# Patient Record
Sex: Male | Born: 1960 | Race: White | Hispanic: No | Marital: Married | Smoking: Current every day smoker
Health system: Southern US, Community
[De-identification: ages and names within clinical notes are randomized; demographics above are authoritative.]

## PROBLEM LIST (undated history)

## (undated) DIAGNOSIS — I10 Essential (primary) hypertension: Secondary | ICD-10-CM

## (undated) DIAGNOSIS — C801 Malignant (primary) neoplasm, unspecified: Secondary | ICD-10-CM

## (undated) DIAGNOSIS — K259 Gastric ulcer, unspecified as acute or chronic, without hemorrhage or perforation: Secondary | ICD-10-CM

## (undated) DIAGNOSIS — B029 Zoster without complications: Secondary | ICD-10-CM

## (undated) DIAGNOSIS — D649 Anemia, unspecified: Secondary | ICD-10-CM

## (undated) DIAGNOSIS — R197 Diarrhea, unspecified: Secondary | ICD-10-CM

## (undated) DIAGNOSIS — I81 Portal vein thrombosis: Secondary | ICD-10-CM

## (undated) DIAGNOSIS — R634 Abnormal weight loss: Secondary | ICD-10-CM

## (undated) DIAGNOSIS — K5900 Constipation, unspecified: Secondary | ICD-10-CM

## (undated) DIAGNOSIS — K635 Polyp of colon: Secondary | ICD-10-CM

## (undated) DIAGNOSIS — K7460 Unspecified cirrhosis of liver: Secondary | ICD-10-CM

## (undated) DIAGNOSIS — K649 Unspecified hemorrhoids: Secondary | ICD-10-CM

## (undated) HISTORY — DX: Unspecified hemorrhoids: K64.9

## (undated) HISTORY — DX: Unspecified cirrhosis of liver: K74.60

## (undated) HISTORY — DX: Abnormal weight loss: R63.4

## (undated) HISTORY — DX: Malignant (primary) neoplasm, unspecified: C80.1

## (undated) HISTORY — DX: Diarrhea, unspecified: R19.7

## (undated) HISTORY — DX: Polyp of colon: K63.5

## (undated) HISTORY — DX: Gastric ulcer, unspecified as acute or chronic, without hemorrhage or perforation: K25.9

## (undated) HISTORY — DX: Constipation, unspecified: K59.00

---

## 2012-02-14 ENCOUNTER — Emergency Department: Payer: Self-pay | Admitting: Unknown Physician Specialty

## 2012-02-14 LAB — CBC
HGB: 18.8 g/dL — ABNORMAL HIGH (ref 13.0–18.0)
MCH: 32.4 pg (ref 26.0–34.0)
Platelet: 327 10*3/uL (ref 150–440)
RBC: 5.79 10*6/uL (ref 4.40–5.90)
RDW: 13.9 % (ref 11.5–14.5)
WBC: 21.4 10*3/uL — ABNORMAL HIGH (ref 3.8–10.6)

## 2012-02-14 LAB — COMPREHENSIVE METABOLIC PANEL
Albumin: 3.7 g/dL (ref 3.4–5.0)
Anion Gap: 10 (ref 7–16)
Bilirubin,Total: 0.3 mg/dL (ref 0.2–1.0)
Chloride: 105 mmol/L (ref 98–107)
Co2: 25 mmol/L (ref 21–32)
Creatinine: 1.28 mg/dL (ref 0.60–1.30)
EGFR (African American): >60
EGFR (Non-African Amer.): >60
Glucose: 182 mg/dL — ABNORMAL HIGH (ref 65–99)
Osmolality: 288 (ref 275–301)
Potassium: 4.3 mmol/L (ref 3.5–5.1)
SGPT (ALT): 23 U/L
Sodium: 140 mmol/L (ref 136–145)

## 2012-02-14 LAB — MAGNESIUM: Magnesium: 2.1 mg/dL

## 2012-02-14 LAB — TROPONIN I: Troponin-I: <0.02 ng/mL

## 2015-05-06 ENCOUNTER — Encounter: Payer: Self-pay | Admitting: Emergency Medicine

## 2015-05-06 ENCOUNTER — Inpatient Hospital Stay
Admission: EM | Admit: 2015-05-06 | Discharge: 2015-05-14 | DRG: 811 | Disposition: A | Discharge status: Home / Self Care | Payer: Self-pay | Attending: Internal Medicine | Admitting: Internal Medicine

## 2015-05-06 DIAGNOSIS — Z87891 Personal history of nicotine dependence: Secondary | ICD-10-CM

## 2015-05-06 DIAGNOSIS — R4781 Slurred speech: Secondary | ICD-10-CM

## 2015-05-06 DIAGNOSIS — R7401 Elevation of levels of liver transaminase levels: Secondary | ICD-10-CM

## 2015-05-06 DIAGNOSIS — K222 Esophageal obstruction: Secondary | ICD-10-CM | POA: Diagnosis present

## 2015-05-06 DIAGNOSIS — Z9114 Patient's other noncompliance with medication regimen: Secondary | ICD-10-CM

## 2015-05-06 DIAGNOSIS — K3189 Other diseases of stomach and duodenum: Secondary | ICD-10-CM | POA: Diagnosis present

## 2015-05-06 DIAGNOSIS — Z7982 Long term (current) use of aspirin: Secondary | ICD-10-CM

## 2015-05-06 DIAGNOSIS — E43 Unspecified severe protein-calorie malnutrition: Secondary | ICD-10-CM | POA: Diagnosis present

## 2015-05-06 DIAGNOSIS — K7030 Alcoholic cirrhosis of liver without ascites: Secondary | ICD-10-CM | POA: Diagnosis present

## 2015-05-06 DIAGNOSIS — R410 Disorientation, unspecified: Secondary | ICD-10-CM | POA: Insufficient documentation

## 2015-05-06 DIAGNOSIS — J9601 Acute respiratory failure with hypoxia: Secondary | ICD-10-CM | POA: Diagnosis present

## 2015-05-06 DIAGNOSIS — Z4659 Encounter for fitting and adjustment of other gastrointestinal appliance and device: Secondary | ICD-10-CM

## 2015-05-06 DIAGNOSIS — J189 Pneumonia, unspecified organism: Secondary | ICD-10-CM | POA: Insufficient documentation

## 2015-05-06 DIAGNOSIS — K922 Gastrointestinal hemorrhage, unspecified: Secondary | ICD-10-CM | POA: Insufficient documentation

## 2015-05-06 DIAGNOSIS — D500 Iron deficiency anemia secondary to blood loss (chronic): Secondary | ICD-10-CM | POA: Diagnosis present

## 2015-05-06 DIAGNOSIS — E876 Hypokalemia: Secondary | ICD-10-CM | POA: Diagnosis present

## 2015-05-06 DIAGNOSIS — Z6820 Body mass index (BMI) 20.0-20.9, adult: Secondary | ICD-10-CM

## 2015-05-06 DIAGNOSIS — J690 Pneumonitis due to inhalation of food and vomit: Secondary | ICD-10-CM | POA: Insufficient documentation

## 2015-05-06 DIAGNOSIS — R140 Abdominal distension (gaseous): Secondary | ICD-10-CM | POA: Insufficient documentation

## 2015-05-06 DIAGNOSIS — I10 Essential (primary) hypertension: Secondary | ICD-10-CM | POA: Diagnosis present

## 2015-05-06 DIAGNOSIS — I81 Portal vein thrombosis: Secondary | ICD-10-CM | POA: Diagnosis present

## 2015-05-06 DIAGNOSIS — D649 Anemia, unspecified: Secondary | ICD-10-CM

## 2015-05-06 DIAGNOSIS — E440 Moderate protein-calorie malnutrition: Secondary | ICD-10-CM | POA: Insufficient documentation

## 2015-05-06 DIAGNOSIS — K766 Portal hypertension: Secondary | ICD-10-CM | POA: Diagnosis present

## 2015-05-06 DIAGNOSIS — D62 Acute posthemorrhagic anemia: Principal | ICD-10-CM | POA: Diagnosis present

## 2015-05-06 DIAGNOSIS — J9690 Respiratory failure, unspecified, unspecified whether with hypoxia or hypercapnia: Secondary | ICD-10-CM

## 2015-05-06 DIAGNOSIS — R740 Nonspecific elevation of levels of transaminase and lactic acid dehydrogenase [LDH]: Secondary | ICD-10-CM

## 2015-05-06 DIAGNOSIS — R195 Other fecal abnormalities: Secondary | ICD-10-CM

## 2015-05-06 DIAGNOSIS — Z8249 Family history of ischemic heart disease and other diseases of the circulatory system: Secondary | ICD-10-CM

## 2015-05-06 DIAGNOSIS — R7989 Other specified abnormal findings of blood chemistry: Secondary | ICD-10-CM | POA: Diagnosis present

## 2015-05-06 DIAGNOSIS — R748 Abnormal levels of other serum enzymes: Secondary | ICD-10-CM | POA: Diagnosis present

## 2015-05-06 DIAGNOSIS — F10231 Alcohol dependence with withdrawal delirium: Secondary | ICD-10-CM | POA: Diagnosis present

## 2015-05-06 DIAGNOSIS — F28 Other psychotic disorder not due to a substance or known physiological condition: Secondary | ICD-10-CM | POA: Diagnosis present

## 2015-05-06 DIAGNOSIS — F419 Anxiety disorder, unspecified: Secondary | ICD-10-CM | POA: Diagnosis present

## 2015-05-06 DIAGNOSIS — R0602 Shortness of breath: Secondary | ICD-10-CM | POA: Insufficient documentation

## 2015-05-06 HISTORY — DX: Essential (primary) hypertension: I10

## 2015-05-06 LAB — CBC WITH DIFFERENTIAL/PLATELET
BASOS ABS: 0 10*3/uL (ref 0–0.1)
BASOS PCT: 0 %
EOS ABS: 0 10*3/uL (ref 0–0.7)
EOS PCT: 0 %
HCT: 18.5 % — ABNORMAL LOW (ref 40.0–52.0)
Hemoglobin: 5.9 g/dL — ABNORMAL LOW (ref 13.0–18.0)
Lymphocytes Relative: 5 %
Lymphs Abs: 0.6 10*3/uL — ABNORMAL LOW (ref 1.0–3.6)
MCH: 24.7 pg — ABNORMAL LOW (ref 26.0–34.0)
MCHC: 31.6 g/dL — AB (ref 32.0–36.0)
MCV: 78.1 fL — ABNORMAL LOW (ref 80.0–100.0)
MONO ABS: 1 10*3/uL (ref 0.2–1.0)
Monocytes Relative: 8 %
Neutro Abs: 10.9 10*3/uL — ABNORMAL HIGH (ref 1.4–6.5)
Neutrophils Relative %: 87 %
PLATELETS: 142 10*3/uL — AB (ref 150–440)
RBC: 2.37 MIL/uL — ABNORMAL LOW (ref 4.40–5.90)
RDW: 18.6 % — AB (ref 11.5–14.5)
WBC: 12.5 10*3/uL — ABNORMAL HIGH (ref 3.8–10.6)

## 2015-05-06 LAB — URINALYSIS COMPLETE WITH MICROSCOPIC (ARMC ONLY)
BILIRUBIN URINE: NEGATIVE
Bacteria, UA: NONE SEEN
GLUCOSE, UA: NEGATIVE mg/dL
Hgb urine dipstick: NEGATIVE
Ketones, ur: NEGATIVE mg/dL
Leukocytes, UA: NEGATIVE
Nitrite: NEGATIVE
PH: 6 (ref 5.0–8.0)
Protein, ur: 100 mg/dL — AB
Specific Gravity, Urine: 1.014 (ref 1.005–1.030)

## 2015-05-06 LAB — COMPREHENSIVE METABOLIC PANEL
ALBUMIN: 1.8 g/dL — AB (ref 3.5–5.0)
ALK PHOS: 150 U/L — AB (ref 38–126)
ALT: 28 U/L (ref 17–63)
ANION GAP: 7 (ref 5–15)
AST: 27 U/L (ref 15–41)
BILIRUBIN TOTAL: 1.3 mg/dL — AB (ref 0.3–1.2)
BUN: 13 mg/dL (ref 6–20)
CALCIUM: 7.5 mg/dL — AB (ref 8.9–10.3)
CO2: 25 mmol/L (ref 22–32)
CREATININE: 0.67 mg/dL (ref 0.61–1.24)
Chloride: 104 mmol/L (ref 101–111)
GFR calc Af Amer: >60 mL/min (ref >60)
GFR calc non Af Amer: >60 mL/min (ref >60)
GLUCOSE: 162 mg/dL — AB (ref 65–99)
Potassium: 2.4 mmol/L — CL (ref 3.5–5.1)
Sodium: 136 mmol/L (ref 135–145)
TOTAL PROTEIN: 5.5 g/dL — AB (ref 6.5–8.1)

## 2015-05-06 LAB — AMMONIA: AMMONIA: 35 umol/L (ref 9–35)

## 2015-05-06 LAB — PROTIME-INR
INR: 1.37
Prothrombin Time: 17.1 seconds — ABNORMAL HIGH (ref 11.4–15.0)

## 2015-05-06 LAB — PREPARE RBC (CROSSMATCH)

## 2015-05-06 LAB — PHOSPHORUS: PHOSPHORUS: 2.4 mg/dL — AB (ref 2.5–4.6)

## 2015-05-06 LAB — MAGNESIUM: Magnesium: 1.8 mg/dL (ref 1.7–2.4)

## 2015-05-06 LAB — ABO/RH: ABO/RH(D): A POS

## 2015-05-06 MED ORDER — SODIUM CHLORIDE 0.9 % IV SOLN
8.0000 mg/h | INTRAVENOUS | Status: AC
  Administered 2015-05-07 – 2015-05-09 (×4): 8 mg/h via INTRAVENOUS
  Filled 2015-05-06 (×6): qty 80

## 2015-05-06 MED ORDER — ACETAMINOPHEN 325 MG PO TABS
650.0000 mg | ORAL_TABLET | Freq: Four times a day (QID) | ORAL | Status: DC | PRN
  Administered 2015-05-10: 650 mg via ORAL
  Filled 2015-05-06: qty 2

## 2015-05-06 MED ORDER — OXYCODONE HCL 5 MG PO TABS: 5.0000 mg | ORAL_TABLET | ORAL | Status: DC | PRN

## 2015-05-06 MED ORDER — MORPHINE SULFATE (PF) 2 MG/ML IV SOLN: 2.0000 mg | INTRAVENOUS | Status: DC | PRN

## 2015-05-06 MED ORDER — SODIUM CHLORIDE 0.9 % IV SOLN
10.0000 mL/h | Freq: Once | INTRAVENOUS | Status: AC
  Administered 2015-05-07: 10 mL/h via INTRAVENOUS

## 2015-05-06 MED ORDER — POTASSIUM CHLORIDE CRYS ER 20 MEQ PO TBCR: 40.0000 meq | EXTENDED_RELEASE_TABLET | Freq: Once | ORAL | Status: AC

## 2015-05-06 MED ORDER — ACETAMINOPHEN 650 MG RE SUPP
650.0000 mg | Freq: Four times a day (QID) | RECTAL | Status: DC | PRN
  Administered 2015-05-08 – 2015-05-10 (×3): 650 mg via RECTAL
  Filled 2015-05-06 (×3): qty 1

## 2015-05-06 MED ORDER — POTASSIUM CHLORIDE CRYS ER 20 MEQ PO TBCR
40.0000 meq | EXTENDED_RELEASE_TABLET | Freq: Once | ORAL | Status: AC
  Administered 2015-05-06: 40 meq via ORAL

## 2015-05-06 MED ORDER — POTASSIUM CHLORIDE CRYS ER 20 MEQ PO TBCR
EXTENDED_RELEASE_TABLET | ORAL | Status: AC
  Administered 2015-05-06: 40 meq via ORAL
  Filled 2015-05-06: qty 2

## 2015-05-06 MED ORDER — SODIUM CHLORIDE 0.9 % IV SOLN
80.0000 mg | Freq: Once | INTRAVENOUS | Status: AC
  Administered 2015-05-06: 80 mg via INTRAVENOUS
  Filled 2015-05-06: qty 80

## 2015-05-06 MED ORDER — SODIUM CHLORIDE 0.9 % IV SOLN
INTRAVENOUS | Status: DC
  Administered 2015-05-06 – 2015-05-08 (×3): via INTRAVENOUS

## 2015-05-06 MED ORDER — POTASSIUM CHLORIDE 10 MEQ/100ML IV SOLN
10.0000 meq | Freq: Once | INTRAVENOUS | Status: AC
  Administered 2015-05-06: 10 meq via INTRAVENOUS
  Filled 2015-05-06: qty 100

## 2015-05-06 NOTE — ED Notes (Signed)
Pt presents to ED via EMS with c/o lethargic with no appetite for about 3 weeks. Pt states has been sleeping a lot and family was concern and call EMS. Pt also reports he was heavy drinking and stopped drinking for about 3.5 weeks now. States he gets shaky at night. Pt alerts and oriented x4 at this time. Per EMS, CBG-218, BP-98/62, HR-105.

## 2015-05-06 NOTE — ED Notes (Signed)
Patient with abdominal pain, chest pain, diarrhea x 3 days. Unsure if having any fever at home. Was at work and vomited so he decided to come get checked out.

## 2015-05-06 NOTE — H&P (Addendum)
[facility] [place] NAME: Manuel Jimenez    MR#:  [id number]  DATE OF BIRTH:  May 20, 1961   DATE OF ADMISSION:  05/06/2015  PRIMARY CARE PHYSICIAN: Marinda Elk, MD   REQUESTING/REFERRING PHYSICIAN: Lord  CHIEF COMPLAINT:   Chief Complaint  Patient presents with  . Fatigue    HISTORY OF PRESENT ILLNESS:  Manuel Jimenez  is a 54 y.o. male with a known history of essential hypertension, alcohol abuse presenting with generalized fatigue/weakness. He describes approximately 3-4 week total duration of progressive fatigue and generalized weakness. He also complains of associated epigastric pain, "pain" when asked to further qualify, nonradiating, intensity 4/10 no worsening or relieving factors. Also describes having dyspnea on exertion with his fatigue. He denotes having loose bowel movements for approximately 1-2 weeks however denies any frank bleeding or any melenic colored stools. Given progressive symptoms decided presents to Hospital further workup and evaluation found to be anemic with hemoglobin of 5.9. Emergency department course: Transfusion 2 unit packed red blood cells initiated, Protonix drip initiated  PAST MEDICAL HISTORY:   Past Medical History  Diagnosis Date  . Hypertension     PAST SURGICAL HISTORY:  History reviewed. No pertinent past surgical history.  SOCIAL HISTORY:   Social History  Substance Use Topics  . Smoking status: Former Research scientist (life sciences)  . Smokeless tobacco: Never Used  . Alcohol Use: Yes     Comment: occ    FAMILY HISTORY:   Family History  Problem Relation Age of Onset  . CAD Father     DRUG ALLERGIES:  No Known Allergies  REVIEW OF SYSTEMS:  REVIEW OF SYSTEMS:  CONSTITUTIONAL: Denies fevers, chills, positive fatigue, weakness.  EYES: Denies blurred vision, double vision, or eye pain.  EARS, NOSE, THROAT: Denies tinnitus, ear pain, hearing loss.  RESPIRATORY: denies cough,  shortness of breath, wheezing  CARDIOVASCULAR: Denies chest pain, palpitations, edema.  GASTROINTESTINAL: Denies nausea, vomiting, positive diarrhea, abdominal pain.  GENITOURINARY: Denies dysuria, hematuria.  ENDOCRINE: Denies nocturia or thyroid problems. HEMATOLOGIC AND LYMPHATIC: Denies easy bruising or bleeding.  SKIN: Denies rash or lesions.  MUSCULOSKELETAL: Denies pain in neck, back, shoulder, knees, hips, or further arthritic symptoms.  NEUROLOGIC: Denies paralysis, paresthesias.  PSYCHIATRIC: Denies anxiety or depressive symptoms. Otherwise full review of systems performed by me is negative.   MEDICATIONS AT HOME:   Prior to Admission medications   Medication Sig Start Date End Date Taking? Authorizing Provider  Aspirin-Acetaminophen-Caffeine (GOODY HEADACHE PO) Take 1 packet by mouth as needed.   Yes Historical Provider, MD      VITAL SIGNS:  Blood pressure 100/69, pulse 86, temperature 98.6 F (37 C), temperature source Oral, resp. rate 18, SpO2 95 %.  PHYSICAL EXAMINATION:  VITAL SIGNS: Filed Vitals:   05/06/15 2300  BP: 100/69  Pulse: 86  Temp:   Resp: 44   GENERAL:54 y.o.male currently in no acute distress. Chronically ill appearing HEAD: Normocephalic, atraumatic.  EYES: Pupils equal, round, reactive to light. Extraocular muscles intact. Positive scleral icterus.  MOUTH: Moist mucosal membrane. Dentition intact. No abscess noted.  EAR, NOSE, THROAT: Clear without exudates. No external lesions.  NECK: Supple. No thyromegaly. No nodules. No JVD.  PULMONARY: Clear to ascultation, without wheeze rails or rhonci. No use of accessory muscles, Good respiratory effort. good air entry bilaterally CHEST: Nontender to palpation.  CARDIOVASCULAR: S1 and S2. Regular rate and rhythm. No murmurs, rubs, or gallops. No edema. Pedal pulses 2+ bilaterally.  GASTROINTESTINAL:  Soft, mild tenderness epigastric region without rebound or guarding, nondistended. No masses. Positive  bowel sounds. No hepatosplenomegaly.  MUSCULOSKELETAL: No swelling, clubbing, or edema. Range of motion full in all extremities.  NEUROLOGIC: Cranial nerves II through XII are intact. No gross focal neurological deficits. Sensation intact. Reflexes intact.  SKIN: No ulceration, lesions, rashes, or cyanosis. Skin warm and dry. Turgor intact. Jaundiced PSYCHIATRIC: Mood, affect within normal limits. The patient is awake, alert and oriented x 3. Insight, judgment intact.    LABORATORY PANEL:   CBC  Recent Labs Lab 05/06/15 2038  WBC 12.5*  HGB 5.9*  HCT 18.5*  PLT 142*   ------------------------------------------------------------------------------------------------------------------  Chemistries   Recent Labs Lab 05/06/15 2038  NA 136  K 2.4*  CL 104  CO2 25  GLUCOSE 162*  BUN 13  CREATININE 0.67  CALCIUM 7.5*  AST 27  ALT 28  ALKPHOS 150*  BILITOT 1.3*   ------------------------------------------------------------------------------------------------------------------  Cardiac Enzymes No results for input(s): TROPONINI in the last 168 hours. ------------------------------------------------------------------------------------------------------------------  RADIOLOGY:  No results found.  EKG:   Orders placed or performed during the hospital encounter of 05/06/15  . ED EKG  . ED EKG  . EKG 12-Lead  . EKG 12-Lead    IMPRESSION AND PLAN:   54 year old Caucasian gentleman history of essential hypertension noncompliant with medications presenting with fatigue found to be anemic  1. Symptomatic anemia: Transfused 2 unit packed red blood cell denies initiated emergency department, Protonix injection followed by infusion continue for suspected GI bleed. Trend CBC every 6 hours, consult gastroenterology avoid further anticoagulate/antiplatelets medications 2. Hypokalemia: Replace potassium to goal 4-5 3. Abnormal liver function tests: Noted elevated alkaline  phosphatase with elevated bilirubin we'll check ultrasound right upper quadrant looking for further signs of liver disease were also check INR if not are done 4. Venous thrombi embolism prophylactic: SCDs    All the records are reviewed and case discussed with ED provider. Management plans discussed with the patient, family and they are in agreement.  CODE STATUS: Full  TOTAL TIME TAKING CARE OF THIS PATIENT: 35 minutes.    Hower,  Karenann Cai.D on 05/06/2015 at 11:05 PM  Between 7am to 6pm - Pager - [phone]  After 6pm: House Pager: - [phone]  [facility]  Office  [phone]  CC: Primary care physician; Marinda Elk, MD

## 2015-05-06 NOTE — ED Notes (Signed)
Pt informed that urine needed, states he is unable to now but will try again.

## 2015-05-06 NOTE — ED Provider Notes (Signed)
[facility] Emergency Department Provider Note   ____________________________________________  Time seen: On EMS arrival I have reviewed the triage vital signs and the triage nursing note.  HISTORY  Chief Complaint Fatigue   Historian Patient  HPI Manuel Jimenez is a 54 y.o. male who is presenting for decreased appetite, decreased activity level for about 3 weeks now. Just prior to the symptoms patient stopped drinking heavy alcohol, and weaned down to nothing alcoholic about 3.5 weeks ago. He also quit smoking at the same time. He states that he thought he did pretty well stopping the drinking and smoking, but now he feels even worse with no energy. He states he has no appetite. He thinks he might be dehydrated. He is denying any chest pain, trouble breathing, coughing, fever, abdominal pain, diarrhea, or confusion. Patient states he does have a primary care physician, however he has not seen her for this.per EMS his blood pressure was 90/62 and heart rate 105 and started receiving 1 L fluid bolus prior to arrival to the ED.    Past Medical History  Diagnosis Date  . Hypertension     There are no active problems to display for this patient.   History reviewed. No pertinent past surgical history.  No current outpatient prescriptions on file.  Allergies Review of patient's allergies indicates no known allergies.  History reviewed. No pertinent family history.  Social History Social History  Substance Use Topics  . Smoking status: Former Research scientist (life sciences)  . Smokeless tobacco: Never Used  . Alcohol Use: Yes     Comment: occ    Review of Systems  Constitutional: Negative for fever. Eyes: Negative for visual changes. ENT: Negative for sore throat. Cardiovascular: Negative for chest pain. Respiratory: Negative for shortness of breath. Gastrointestinal: Negative for abdominal pain, vomiting and diarrhea. Genitourinary: Negative for  dysuria. Musculoskeletal: Negative for back pain. Skin: Negative for rash. Neurological: Negative for headache.occasional shaking at night. 10 point Review of Systems otherwise negative ____________________________________________   PHYSICAL EXAM:  VITAL SIGNS: ED Triage Vitals  Enc Vitals Group     BP 05/06/15 2004 92/65 mmHg     Pulse Rate 05/06/15 2004 98     Resp 05/06/15 2004 18     Temp 05/06/15 2004 99.5 F (37.5 C)     Temp Source 05/06/15 2004 Oral     SpO2 05/06/15 2004 95 %     Weight --      Height --      Head Cir --      Peak Flow --      Pain Score 05/06/15 1959 0     Pain Loc --      Pain Edu? --      Excl. in [place]? --      Constitutional: Alert and oriented. In no acute distress. Eyes: Conjunctivae slightly icteric. PERRL. Normal extraocular movements. ENT   Head: Normocephalic and atraumatic.   Nose: No congestion/rhinnorhea.   Mouth/Throat: Mucous membranes are moderately dry..   Neck: No stridor. Cardiovascular/Chest: Normal rate, regular rhythm.  No murmurs, rubs, or gallops. Respiratory: Normal respiratory effort without tachypnea nor retractions. Breath sounds are clear and equal bilaterally. No wheezes/rales/rhonchi. Gastrointestinal: Soft. No guarding, no rebound. Slightly enlarged liver on palpation. Nontender abdomen.  Genitourinary/rectal:Deferred Musculoskeletal: Nontender with normal range of motion in all extremities. No joint effusions.  No lower extremity tenderness.  No edema. Neurologic:  Normal speech and language. No gross or focal neurologic deficits are appreciated. Skin:  Skin is  warm, dry and intact. No rash noted. Psychiatric: Mood and affect are normal. Speech and behavior are normal. Patient exhibits appropriate insight and judgment.no depressed mood. No suicidal ideation.  ____________________________________________   EKG I, Lisa Roca, MD, the attending physician have personally viewed and interpreted all  ECGs.  None 70s for him. Normal sinus rhythm. Narrow QRS. No axis. Q waves septally. Nonspecific T wave. ____________________________________________  LABS (pertinent positives/negatives)  Urinalysis negative Comprehensive metabolic panel significant for potassium 2.4 , calcium 7.5, protein 5.5, albumin 1.8, alkaline phosphatase 150, bili 1.3 and otherwise within normal limits White blood cell count 12.5, hemoglobin 5.9, platelet count 142 INR 1.37 Ammonia 35 ____________________________________________  RADIOLOGY All Xrays were viewed by me. Imaging interpreted by Radiologist.  none __________________________________________  PROCEDURES  Procedure(s) performed: None  Critical Care performed: None  ____________________________________________   ED COURSE / ASSESSMENT AND PLAN  CONSULTATIONS: hospitalist for admission  Pertinent labs & imaging results that were available during my care of the patient were reviewed by me and considered in my medical decision making (see chart for details).   This patient is here with extreme fatigue and looks like he may have liver disease and has history of alcohol abuse, however he is stopped drinking about 3 Weeks ago. He does not appear to be in any active or acute alcohol withdrawal.  Laboratory evaluation showed patient is hypokalemic and he was given both by mouth and IV repletion. Patient's hemoglobin is acutely low at 5.9, previous hemoglobin was 18. Patient denies any history of black or bloody stools, or bloody emesis. Hemoccult was positive strongly. Patient was started on Protonix bolus and drip. He was consented for blood transfusion, and 2 units packed red blood cells were ordered. I discussed the case with the possible is for admission.  Patient / Family / Caregiver informed of clinical course, medical decision-making process, and agree with plan.    ___________________________________________   FINAL CLINICAL  IMPRESSION(S) / ED DIAGNOSES   Final diagnoses:  Symptomatic anemia  Occult GI bleeding  Hypokalemia       Lisa Roca, MD 05/06/15 2209

## 2015-05-06 NOTE — ED Notes (Signed)
Report attempted 

## 2015-05-07 LAB — CBC
HCT: 24.6 % — ABNORMAL LOW (ref 40.0–52.0)
HCT: 23.1 % — ABNORMAL LOW (ref 40.0–52.0)
HEMATOCRIT: 24.4 % — AB (ref 40.0–52.0)
HEMOGLOBIN: 7.8 g/dL — AB (ref 13.0–18.0)
Hemoglobin: 8.1 g/dL — ABNORMAL LOW (ref 13.0–18.0)
Hemoglobin: 7.3 g/dL — ABNORMAL LOW (ref 13.0–18.0)
MCH: 25.4 pg — AB (ref 26.0–34.0)
MCH: 25.6 pg — AB (ref 26.0–34.0)
MCH: 25.1 pg — ABNORMAL LOW (ref 26.0–34.0)
MCHC: 31.9 g/dL — AB (ref 32.0–36.0)
MCHC: 32.8 g/dL (ref 32.0–36.0)
MCHC: 31.6 g/dL — ABNORMAL LOW (ref 32.0–36.0)
MCV: 79.8 fL — ABNORMAL LOW (ref 80.0–100.0)
MCV: 78.3 fL — ABNORMAL LOW (ref 80.0–100.0)
MCV: 79.3 fL — ABNORMAL LOW (ref 80.0–100.0)
PLATELETS: 144 10*3/uL — AB (ref 150–440)
PLATELETS: 131 10*3/uL — AB (ref 150–440)
Platelets: 130 10*3/uL — ABNORMAL LOW (ref 150–440)
RBC: 3.06 MIL/uL — ABNORMAL LOW (ref 4.40–5.90)
RBC: 3.14 MIL/uL — ABNORMAL LOW (ref 4.40–5.90)
RBC: 2.92 MIL/uL — ABNORMAL LOW (ref 4.40–5.90)
RDW: 18.2 % — AB (ref 11.5–14.5)
RDW: 17.5 % — AB (ref 11.5–14.5)
RDW: 18.1 % — AB (ref 11.5–14.5)
WBC: 11.3 10*3/uL — ABNORMAL HIGH (ref 3.8–10.6)
WBC: 12.9 10*3/uL — ABNORMAL HIGH (ref 3.8–10.6)
WBC: 11.7 10*3/uL — AB (ref 3.8–10.6)

## 2015-05-07 LAB — BASIC METABOLIC PANEL
ANION GAP: 8 (ref 5–15)
BUN: 12 mg/dL (ref 6–20)
CALCIUM: 7.7 mg/dL — AB (ref 8.9–10.3)
CO2: 24 mmol/L (ref 22–32)
Chloride: 106 mmol/L (ref 101–111)
Creatinine, Ser: 0.53 mg/dL — ABNORMAL LOW (ref 0.61–1.24)
GLUCOSE: 116 mg/dL — AB (ref 65–99)
Potassium: 3.1 mmol/L — ABNORMAL LOW (ref 3.5–5.1)
SODIUM: 138 mmol/L (ref 135–145)

## 2015-05-07 LAB — LIPASE, BLOOD: LIPASE: 21 U/L — AB (ref 22–51)

## 2015-05-07 MED ORDER — POTASSIUM CHLORIDE CRYS ER 20 MEQ PO TBCR
40.0000 meq | EXTENDED_RELEASE_TABLET | ORAL | Status: AC
  Administered 2015-05-07 (×2): 40 meq via ORAL
  Filled 2015-05-07 (×2): qty 2

## 2015-05-07 MED ORDER — ADULT MULTIVITAMIN W/MINERALS CH
1.0000 | ORAL_TABLET | Freq: Every day | ORAL | Status: DC
  Administered 2015-05-10: 1 via ORAL
  Filled 2015-05-07 (×3): qty 1

## 2015-05-07 MED ORDER — LORAZEPAM 0.5 MG PO TABS: 1.0000 mg | ORAL_TABLET | Freq: Four times a day (QID) | ORAL | Status: AC | PRN

## 2015-05-07 MED ORDER — THIAMINE HCL 100 MG/ML IJ SOLN
100.0000 mg | Freq: Every day | INTRAMUSCULAR | Status: DC
  Administered 2015-05-07 – 2015-05-11 (×3): 100 mg via INTRAVENOUS
  Filled 2015-05-07 (×4): qty 2

## 2015-05-07 MED ORDER — VITAMIN B-1 100 MG PO TABS
100.0000 mg | ORAL_TABLET | Freq: Every day | ORAL | Status: DC
  Administered 2015-05-10: 100 mg via ORAL
  Filled 2015-05-07 (×2): qty 1

## 2015-05-07 MED ORDER — LORAZEPAM 2 MG/ML IJ SOLN
1.0000 mg | Freq: Four times a day (QID) | INTRAMUSCULAR | Status: AC | PRN
  Administered 2015-05-07 – 2015-05-08 (×3): 1 mg via INTRAVENOUS
  Filled 2015-05-07 (×4): qty 1

## 2015-05-07 MED ORDER — FOLIC ACID 1 MG PO TABS
1.0000 mg | ORAL_TABLET | Freq: Every day | ORAL | Status: DC
  Administered 2015-05-10: 1 mg via ORAL
  Filled 2015-05-07 (×2): qty 1

## 2015-05-07 NOTE — Consult Note (Signed)
GI Inpatient Consult Note  Reason for Consult: Suspected GI Bleed   Attending Requesting Consult: Dr. Walsh  History of Present Illness: Manuel Jimenez is a 54 y.o. male presented to the Emergency room for evaluation of progressive fatigue over a 3 week period. He stopped drinking heavy alcohol and smoking about 4 weeks ago. Afterwards, he noticed a decreased in his appetite and generalize weakness.  Hemoglobin noted to be 5.9 gm/dl and potassium level of 2.4.  Alk Phos elevated and total bilirubin 1.3.  He denies hematemesis, epigastric pain, diarrhea, constipation, dark stools or bright red blood per rectum.  Hemoglobin up to 8.1 gm/dl after receiving 2 units of PRBCs.  No evidence of bleeding noted.   CIWA protocol has been started. Protonix infusion and Potassium replacement has been ordered. His Alkaline phosphatase and elevated bilirubin noted. US of right upper quadrant has been ordered.  No prior EGD or Colonoscopy per patient's wife.     Past Medical History:  Past Medical History  Diagnosis Date  . Hypertension     Problem List: Patient Active Problem List   Diagnosis Date Noted  . Symptomatic anemia 05/06/2015  . Hypokalemia 05/06/2015    Past Surgical History: History reviewed. No pertinent past surgical history.  Allergies: No Known Allergies  Home Medications: Prescriptions prior to admission  Medication Sig Dispense Refill Last Dose  . Aspirin-Acetaminophen-Caffeine (GOODY HEADACHE PO) Take 1 packet by mouth as needed.   Past Week at Unknown time   Home medication reconciliation was completed with the patient.   Scheduled Inpatient Medications:   . folic acid  1 mg Oral Daily  . multivitamin with minerals  1 tablet Oral Daily  . potassium chloride  40 mEq Oral Q4H  . thiamine  100 mg Oral Daily   Or  . thiamine  100 mg Intravenous Daily    Continuous Inpatient Infusions:   . sodium chloride 100 mL/hr at 05/07/15 0309  . pantoprozole (PROTONIX) infusion  8 mg/hr (05/07/15 1212)    PRN Inpatient Medications:  acetaminophen **OR** acetaminophen, LORazepam **OR** LORazepam, morphine injection, oxyCODONE  Family History: family history includes CAD in his father.  The patient's family history is negative for inflammatory bowel disorders, GI malignancy, or solid organ transplantation.  Social History:   Reports that he has quit smoking. He has never used smokeless tobacco. He reports a history of heavy alcohol intake. He stopped drinking about 4 weeks ago per wife.  He reports that he does not use illicit drugs.  Review of Systems: Constitutional: Weight is stable.  Eyes: No changes in vision. ENT: No oral lesions, sore throat.  GI: see HPI.  Heme/Lymph: No easy bruising.  CV: No chest pain.  GU: No hematuria.  Integumentary: No rashes.  Neuro: No headaches.  Psych: No depression/anxiety.  Endocrine: No heat/cold intolerance.  Allergic/Immunologic: No urticaria.  Resp: No cough, SOB.  Musculoskeletal: No joint swelling.      Physical Examination: BP 108/69 mmHg  Pulse 101  Temp(Src) 99 F (37.2 C) (Oral)  Resp 20  Ht 5' 11" (1.803 m)  Wt 61.417 kg (135 lb 6.4 oz)  BMI 18.89 kg/m2  SpO2 99% Gen: NAD, alert and oriented x 4. Thin. Appears malnourished.  HEENT: PEERLA, EOMI, Neck: supple, no JVD or thyromegaly Chest: CTA bilaterally, no wheezes, crackles, or other adventitious sounds CV: RRR, no m/g/c/r Abd: soft, mild right upper quadrant tenderness to palpation, slight distentioin, +BS in all four quadrants; no HSM, guarding, ridigity, or rebound tenderness Ext:   no edema, well perfused with 2+ pulses, Skin: no rash or lesions noted Lymph: no LAD   Data: Lab Results  Component Value Date   WBC 12.9* 05/07/2015   HGB 8.1* 05/07/2015   HCT 24.6* 05/07/2015   MCV 78.3* 05/07/2015   PLT 144* 05/07/2015    Recent Labs Lab 05/06/15 2038 05/07/15 0532 05/07/15 1041  HGB 5.9* 7.8* 8.1*   Lab Results  Component  Value Date   NA 138 05/07/2015   K 3.1* 05/07/2015   CL 106 05/07/2015   CO2 24 05/07/2015   BUN 12 05/07/2015   CREATININE 0.53* 05/07/2015   Lab Results  Component Value Date   ALT 28 05/06/2015   AST 27 05/06/2015   ALKPHOS 150* 05/06/2015   BILITOT 1.3* 05/06/2015    Recent Labs Lab 05/06/15 2038  INR 1.37   Assessment/Plan: [name] is a 54 y.o. male who presented with a severe anemia. Suspected GI bleed.  Epigastric tenderness noted on exam. Alcoholism per history. US has been ordered. He may have a degree of Cirrhosis secondary to his alcoholism. As well as his hypokalemia and elevated liver enzymes.  Recommendations:   EGD to be performed in the morning per Dr. Oh. Continue with Protonix. Colonoscopy as an outpatient. Further recommendations to follow.   Thank you for the consult. Please call with questions or concerns.  Patient seen with Dr. Paul Oh under collaborative agreement.   , FNP    

## 2015-05-07 NOTE — Progress Notes (Signed)
Called dr Volanda Napoleon regarding the pt temp of 99, heart rate of 101, and the pt is shaking really bad, states he is cold, blood was give by previous shift,  Do not complain of pain or discomfort.  Three blankets given to the pt and will continue to monitor the pt.  Dr Volanda Napoleon states she will order a BMET.

## 2015-05-07 NOTE — Consult Note (Signed)
  Pt seen and examined.  Please see D. Maritin's notes. Alcoholic with typical appearance of chronic liver disease. Anemic. Denies obvious GI blood loss. Will start EGD 1st. Will likely need colonoscopy later. Thanks.

## 2015-05-07 NOTE — Progress Notes (Signed)
[facility] [place] NAME: Manuel Jimenez    MR#:  [id number]  DATE OF BIRTH:  07-21-1961  SUBJECTIVE:  CHIEF COMPLAINT:   Chief Complaint  Patient presents with  . Fatigue   Groggy after receiving Ativan. Denies pain. No further loose bowel movements  REVIEW OF SYSTEMS:   Review of Systems  Constitutional: Negative for fever.  Respiratory: Negative for shortness of breath.   Cardiovascular: Negative for chest pain and palpitations.  Gastrointestinal: Positive for diarrhea. Negative for nausea, vomiting and abdominal pain.  Genitourinary: Negative for dysuria.    DRUG ALLERGIES:  No Known Allergies  VITALS:  Blood pressure 108/69, pulse 101, temperature 99 F (37.2 C), temperature source Oral, resp. rate 20, height 5\' 11"  (1.803 m), weight 61.417 kg (135 lb 6.4 oz), SpO2 99 %.  PHYSICAL EXAMINATION:  GENERAL:  54 y.o.-year-old patient lying in the bed with no acute distress. Groggy EYES: Pupils equal, round, reactive to light and accommodation. No scleral icterus. Extraocular muscles intact.  HEENT: Head atraumatic, normocephalic. Oropharynx and nasopharynx clear. Mucous membranes are dry NECK:  Supple, no jugular venous distention. No thyroid enlargement, no tenderness.  LUNGS: Normal breath sounds bilaterally, no wheezing, rales,rhonchi or crepitation. No use of accessory muscles of respiration.  CARDIOVASCULAR: S1, S2 normal. No murmurs, rubs, or gallops.  ABDOMEN: Soft, tender to palpation in the upper quadrants, nondistended. Bowel sounds present. No organomegaly or mass.  EXTREMITIES: No pedal edema, cyanosis, or clubbing.  NEUROLOGIC: Patient very groggy PSYCHIATRIC: The patient is groggy  SKIN: No obvious rash, lesion, or ulcer.    LABORATORY PANEL:   CBC  Recent Labs Lab 05/07/15 1041  WBC 12.9*  HGB 8.1*  HCT 24.6*  PLT 144*    ------------------------------------------------------------------------------------------------------------------  Chemistries   Recent Labs Lab 05/06/15 2038 05/07/15 0532  NA 136 138  K 2.4* 3.1*  CL 104 106  CO2 25 24  GLUCOSE 162* 116*  BUN 13 12  CREATININE 0.67 0.53*  CALCIUM 7.5* 7.7*  MG 1.8  --   AST 27  --   ALT 28  --   ALKPHOS 150*  --   BILITOT 1.3*  --    ------------------------------------------------------------------------------------------------------------------  Cardiac Enzymes No results for input(s): TROPONINI in the last 168 hours. ------------------------------------------------------------------------------------------------------------------  RADIOLOGY:  No results found.  EKG:   Orders placed or performed during the hospital encounter of 05/06/15  . ED EKG  . ED EKG  . EKG 12-Lead  . EKG 12-Lead    ASSESSMENT AND PLAN:   #1 symptomatic anemia, likely upper GI - Status post 2 units packed red blood cells hemoglobin has come from 5.9-8.1 - Gastroenterology consultation is pending - Continue Protonix infusion  #2 alcohol withdrawal - Patient denies family reports heavy alcohol use last drink 2 days ago he has also developed tachycardia and anxiety started CIWA protocol  #3 hypokalemia - Continue to replace and monitor, check magnesium and replace if needed  #4 elevated liver function tests - Check lipase, continue to monitor  CODE STATUS: Full   TOTAL TIME TAKING CARE OF THIS PATIENT: 25 minutes.  Greater than 50% of time spent in care coordination and counseling. POSSIBLE D/C IN 2-3 DAYS, DEPENDING ON CLINICAL CONDITION.   Myrtis Ser M.D on 05/07/2015 at 1:14 PM  Between 7am to 6pm - Pager - [phone]  After 6pm go to [email or web address] - password [email or web address] [facility]  [facility]  Office  [phone]  CC: Primary care  physician; Marinda Elk, MD

## 2015-05-07 NOTE — Progress Notes (Signed)
Initial Nutrition Assessment  DOCUMENTATION CODES:   Severe malnutrition in context of acute illness/injury  INTERVENTION:   Coordination of Care: await diet progression as medically able Medical Food Supplement Therapy: will recommend Ensure Enlive po TID, each supplement provides 350 kcal and 20 grams of protein, once diet able to be progressed   NUTRITION DIAGNOSIS:   Inadequate oral intake related to inability to eat as evidenced by NPO status  GOAL:   Patient will meet greater than or equal to 90% of their needs  MONITOR:    (Energy Intake, Glucose Profile, Electrolyte and Renal Profile, Hepatic Profile, Digestive system)  REASON FOR ASSESSMENT:   Malnutrition Screening Tool    ASSESSMENT:   Pt admitted with symptomatic anemia per MD note, likely from upper GI bleed. Per MD note, pt with h/o heavy EtOH use, on CIWA, and with abnormal liver function tests.  Past Medical History  Diagnosis Date  . Hypertension    Diet Order:  Diet NPO time specified Except for: Sips with Meds    Current Nutrition: Pt NPO  Food/Nutrition-Related History: Pt reports 'I eat little bits throughout the day usually.' Pt reports eating less for the past 3 weeks and reports not having an appetite at all. Pt reports liking Ensure and can drink 2 cans per day when able. Difficult to clarify how often that happens. RD notes h/o EtOH use for past 6 months after job loss but has weaned down to no alcohol for the past 3 weeks.    Medications: folic acid, KCl, MVI, thiamine, NS at 186m/hr, Protonix drip  Electrolyte/Renal Profile and Glucose Profile:   Recent Labs Lab 05/06/15 2038 05/07/15 0532  NA 136 138  K 2.4* 3.1*  CL 104 106  CO2 25 24  BUN 13 12  CREATININE 0.67 0.53*  CALCIUM 7.5* 7.7*  MG 1.8  --   PHOS 2.4*  --   GLUCOSE 162* 116*   Protein Profile:   Recent Labs Lab 05/06/15 2038  ALBUMIN 1.8*   Hepatic Function Latest Ref Rng 05/06/2015 02/14/2012  Total Protein  6.5 - 8.1 g/dL 5.5(L) 6.6  Albumin 3.5 - 5.0 g/dL 1.8(L) 3.7  AST 15 - 41 U/L 27 23  ALT 17 - 63 U/L 28 23  Alk Phosphatase 38 - 126 U/L 150(H) 92  Total Bilirubin 0.3 - 1.2 mg/dL 1.3(H) 0.3   Nutritional Anemia Profile:  CBC Latest Ref Rng 05/07/2015 05/07/2015 05/06/2015  WBC 3.8 - 10.6 K/uL 12.9(H) 11.3(H) 12.5(H)  Hemoglobin 13.0 - 18.0 g/dL 8.1(L) 7.8(L) 5.9(L)  Hematocrit 40.0 - 52.0 % 24.6(L) 24.4(L) 18.5(L)  Platelets 150 - 440 K/uL 144(L) 130(L) 142(L)   Gastrointestinal Profile: Last BM:  05/05/2015   Nutrition-Focused Physical Exam Findings: Nutrition-Focused physical exam completed. Findings are mild-moderate fat depletion, moderate muscle depletion, and no edema.     Weight Change: Pt reports UBW of 150lbs about one month ago (10% weight loss in one month)   Skin:  Reviewed, no issues   Height:   Ht Readings from Last 1 Encounters:  05/07/15 5' 11"  (1.803 m)    Weight:   Wt Readings from Last 1 Encounters:  05/07/15 135 lb 6.4 oz (61.417 kg)    Ideal Body Weight:   78kg  BMI:  Body mass index is 18.89 kg/(m^2).  Estimated Nutritional Needs:   Kcal:  using IBW of 78kg, BEE: 1632kcals, TEE: (IF 1.1-1.3)(AF 1.2)   Protein:  78-94 g protein (1.0-1.2g/kg)  Fluid:  1950-234104mof fluid (25-3015mg)  EDUCATION NEEDS:   Education needs no appropriate at this time   [place], [place], LDN Pager [phone]

## 2015-05-07 NOTE — Care Management Note (Signed)
Case Management Note  Patient Details  Name: Manuel Jimenez MRN: [id number] Date of Birth: 12-26-1960  Subjective/Objective:                  Met with patient's mother as patient continued to sleep during our conversation. 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 His mother said he has had a hard time finding a job with pay that he was receiving at [employer]. She states he has been drinking too much since that loss. She states he lives his his girlfriend Hinton Dyer that has a job with the [employer]. She is unsure if patient has health insurance. She states he owns one-rental property. She suggested that I speak with Hinton Dyer. She states he is independent with mobility/drives.  Action/Plan: Based on this information it sounds like patient will not qualify for medication assistance. RNCM to continue to follow.   Expected Discharge Date:  05/09/15               Expected Discharge Plan:     In-House Referral:     Discharge planning Services  CM Consult  Post Acute Care Choice:    Choice offered to:  Parent  DME Arranged:  N/A DME Agency:     HH Arranged:    [place] Agency:     Status of Service:  In process, will continue to follow  Medicare Important Message Given:    Date Medicare IM Given:    Medicare IM give by:    Date Additional Medicare IM Given:    Additional Medicare Important Message give by:     If discussed at [place] of Stay Meetings, dates discussed:    Additional Comments:  Marshell Garfinkel, RN 05/07/2015, 10:47 AM

## 2015-05-08 ENCOUNTER — Encounter
Admission: EM | Disposition: A | Discharge status: Home / Self Care | Payer: Self-pay | Source: Home / Self Care | Attending: Internal Medicine

## 2015-05-08 ENCOUNTER — Inpatient Hospital Stay: Payer: Self-pay

## 2015-05-08 ENCOUNTER — Encounter: Payer: Self-pay | Admitting: Anesthesiology

## 2015-05-08 DIAGNOSIS — E440 Moderate protein-calorie malnutrition: Secondary | ICD-10-CM | POA: Insufficient documentation

## 2015-05-08 LAB — BLOOD GAS, ARTERIAL
ALLENS TEST (PASS/FAIL): POSITIVE — AB
Acid-Base Excess: 0.4 mmol/L (ref 0.0–3.0)
Bicarbonate: 22 mEq/L (ref 21.0–28.0)
FIO2: 0.21
O2 Saturation: 97.1 %
PATIENT TEMPERATURE: 37
PO2 ART: 82 mmHg — AB (ref 83.0–108.0)
pCO2 arterial: 27 mmHg — ABNORMAL LOW (ref 32.0–48.0)
pH, Arterial: 7.52 — ABNORMAL HIGH (ref 7.350–7.450)

## 2015-05-08 LAB — TYPE AND SCREEN
ABO/RH(D): A POS
ANTIBODY SCREEN: NEGATIVE
UNIT DIVISION: 0
Unit division: 0

## 2015-05-08 LAB — LACTIC ACID, PLASMA: LACTIC ACID, VENOUS: 1.4 mmol/L (ref 0.5–2.0)

## 2015-05-08 LAB — CBC
HCT: 25.3 % — ABNORMAL LOW (ref 40.0–52.0)
HEMOGLOBIN: 7.9 g/dL — AB (ref 13.0–18.0)
MCH: 25.2 pg — ABNORMAL LOW (ref 26.0–34.0)
MCHC: 31.2 g/dL — ABNORMAL LOW (ref 32.0–36.0)
MCV: 80.7 fL (ref 80.0–100.0)
PLATELETS: 144 10*3/uL — AB (ref 150–440)
RBC: 3.14 MIL/uL — AB (ref 4.40–5.90)
RDW: 17.9 % — ABNORMAL HIGH (ref 11.5–14.5)
WBC: 12.4 10*3/uL — AB (ref 3.8–10.6)

## 2015-05-08 LAB — INFLUENZA PANEL BY PCR (TYPE A & B)
H1N1FLUPCR: NOT DETECTED
INFLAPCR: NEGATIVE
INFLBPCR: NEGATIVE

## 2015-05-08 LAB — COMPREHENSIVE METABOLIC PANEL
ALK PHOS: 136 U/L — AB (ref 38–126)
ALT: 23 U/L (ref 17–63)
ANION GAP: 7 (ref 5–15)
AST: 27 U/L (ref 15–41)
Albumin: 1.9 g/dL — ABNORMAL LOW (ref 3.5–5.0)
BILIRUBIN TOTAL: 1.4 mg/dL — AB (ref 0.3–1.2)
BUN: 13 mg/dL (ref 6–20)
CALCIUM: 7.9 mg/dL — AB (ref 8.9–10.3)
CO2: 21 mmol/L — ABNORMAL LOW (ref 22–32)
Chloride: 116 mmol/L — ABNORMAL HIGH (ref 101–111)
Creatinine, Ser: 0.64 mg/dL (ref 0.61–1.24)
Glucose, Bld: 116 mg/dL — ABNORMAL HIGH (ref 65–99)
Potassium: 3.5 mmol/L (ref 3.5–5.1)
SODIUM: 144 mmol/L (ref 135–145)
TOTAL PROTEIN: 5.8 g/dL — AB (ref 6.5–8.1)

## 2015-05-08 LAB — AMMONIA
AMMONIA: 19 umol/L (ref 9–35)
Ammonia: 9 umol/L (ref 9–35)

## 2015-05-08 LAB — PROTIME-INR
INR: 1.48
PROTHROMBIN TIME: 18.1 s — AB (ref 11.4–15.0)

## 2015-05-08 LAB — MAGNESIUM: Magnesium: 1.9 mg/dL (ref 1.7–2.4)

## 2015-05-08 SURGERY — ESOPHAGOGASTRODUODENOSCOPY (EGD) WITH PROPOFOL
Anesthesia: General

## 2015-05-08 MED ORDER — VANCOMYCIN HCL IN DEXTROSE 1-5 GM/200ML-% IV SOLN
1000.0000 mg | INTRAVENOUS | Status: AC
  Administered 2015-05-08: 1000 mg via INTRAVENOUS
  Filled 2015-05-08: qty 200

## 2015-05-08 MED ORDER — ALBUTEROL SULFATE (2.5 MG/3ML) 0.083% IN NEBU
2.5000 mg | INHALATION_SOLUTION | Freq: Once | RESPIRATORY_TRACT | Status: AC
  Administered 2015-05-08: 2.5 mg via RESPIRATORY_TRACT

## 2015-05-08 MED ORDER — LORAZEPAM 2 MG/ML IJ SOLN
2.0000 mg | Freq: Once | INTRAMUSCULAR | Status: AC
  Administered 2015-05-08: 2 mg via INTRAVENOUS
  Filled 2015-05-08: qty 1

## 2015-05-08 MED ORDER — PIPERACILLIN-TAZOBACTAM 3.375 G IVPB
3.3750 g | Freq: Three times a day (TID) | INTRAVENOUS | Status: DC
  Administered 2015-05-08 – 2015-05-12 (×12): 3.375 g via INTRAVENOUS
  Filled 2015-05-08 (×14): qty 50

## 2015-05-08 MED ORDER — IPRATROPIUM-ALBUTEROL 0.5-2.5 (3) MG/3ML IN SOLN: 3.0000 mL | RESPIRATORY_TRACT | Status: DC | PRN

## 2015-05-08 MED ORDER — ALBUTEROL SULFATE (2.5 MG/3ML) 0.083% IN NEBU
INHALATION_SOLUTION | RESPIRATORY_TRACT | Status: AC
  Filled 2015-05-08: qty 3

## 2015-05-08 MED ORDER — LORAZEPAM 2 MG/ML IJ SOLN
2.0000 mg | INTRAMUSCULAR | Status: DC | PRN
  Administered 2015-05-08 – 2015-05-11 (×4): 2 mg via INTRAVENOUS
  Filled 2015-05-08: qty 1
  Filled 2015-05-08: qty 2
  Filled 2015-05-08: qty 1

## 2015-05-08 MED ORDER — VANCOMYCIN HCL IN DEXTROSE 1-5 GM/200ML-% IV SOLN
1000.0000 mg | Freq: Two times a day (BID) | INTRAVENOUS | Status: DC
  Administered 2015-05-09 – 2015-05-11 (×5): 1000 mg via INTRAVENOUS
  Filled 2015-05-08 (×6): qty 200

## 2015-05-08 MED ORDER — LORAZEPAM 2 MG/ML IJ SOLN
1.0000 mg | Freq: Once | INTRAMUSCULAR | Status: AC
  Administered 2015-05-08: 1 mg via INTRAVENOUS

## 2015-05-08 MED ORDER — DEXMEDETOMIDINE HCL IN NACL 400 MCG/100ML IV SOLN
0.4000 ug/kg/h | INTRAVENOUS | Status: DC | PRN
Start: 2015-05-08 — End: 2015-05-09
  Administered 2015-05-08 (×2): 0.8 ug/h via INTRAVENOUS
  Administered 2015-05-08 – 2015-05-09 (×9): 1 ug/h via INTRAVENOUS
  Filled 2015-05-08 (×3): qty 100

## 2015-05-08 MED ORDER — LORAZEPAM 0.5 MG PO TABS: 1.0000 mg | ORAL_TABLET | Freq: Once | ORAL | Status: DC

## 2015-05-08 MED ORDER — SODIUM CHLORIDE 0.9 % IV SOLN
INTRAVENOUS | Status: DC
  Administered 2015-05-08 – 2015-05-09 (×3): 100 mL/h via INTRAVENOUS
  Administered 2015-05-10 – 2015-05-11 (×2): via INTRAVENOUS

## 2015-05-08 NOTE — Progress Notes (Signed)
rn spoke with dr Volanda Napoleon re: increased dyspnea,retracting and respiratory suggests bipap and lasix . md orders transfer pt to stepdown and give 1 time nebulizer .md to await chest xray results

## 2015-05-08 NOTE — Progress Notes (Signed)
[place] at [place] NAME: Manuel Jimenez    MR#:  [id number]  DATE OF BIRTH:  [birth year]  SUBJECTIVE:  CHIEF COMPLAINT:   Chief Complaint  Patient presents with  . Fatigue   Sleepy and confused after receiving Ativan. Patient has had hallucinations and agitation again overnight. His fiance is present today and reports that it has been 4 weeks since his last drink.  REVIEW OF SYSTEMS:   Review of Systems  Constitutional: Negative for fever.  Respiratory: Negative for shortness of breath.   Cardiovascular: Negative for chest pain and palpitations.  Gastrointestinal: Positive for diarrhea. Negative for nausea, vomiting and abdominal pain.  Genitourinary: Negative for dysuria.    DRUG ALLERGIES:  No Known Allergies  VITALS:  Blood pressure 118/72, pulse 90, temperature 99.1 F (37.3 C), temperature source Axillary, resp. rate 18, height 5\' 11"  (1.803 m), weight 61.417 kg (135 lb 6.4 oz), SpO2 100 %.  PHYSICAL EXAMINATION:  GENERAL:  54 y.o.-year-old patient lying in the bed with no acute distress. Groggy. Thin EYES: Pupils equal, round, reactive to light and accommodation. No scleral icterus. Extraocular muscles intact.  HEENT: Head atraumatic, normocephalic. Oropharynx and nasopharynx clear. Mucous membranes are dry NECK:  Supple, no jugular venous distention. No thyroid enlargement, no tenderness.  LUNGS: Normal breath sounds bilaterally, no wheezing, rales,rhonchi or crepitation. No use of accessory muscles of respiration.  CARDIOVASCULAR: S1, S2 normal. No murmurs, rubs, or gallops.  ABDOMEN: Soft, tender to palpation in the upper quadrants, nondistended. Bowel sounds present. No organomegaly or mass.  EXTREMITIES: No pedal edema, cyanosis, or clubbing.  NEUROLOGIC: Patient very groggy PSYCHIATRIC: The patient is groggy  SKIN: No obvious rash, lesion, or ulcer.    LABORATORY PANEL:   CBC  Recent Labs Lab  05/08/15 0454  WBC 12.4*  HGB 7.9*  HCT 25.3*  PLT 144*   ------------------------------------------------------------------------------------------------------------------  Chemistries   Recent Labs Lab 05/08/15 0454  NA 144  K 3.5  CL 116*  CO2 21*  GLUCOSE 116*  BUN 13  CREATININE 0.64  CALCIUM 7.9*  MG 1.9  AST 27  ALT 23  ALKPHOS 136*  BILITOT 1.4*   ------------------------------------------------------------------------------------------------------------------  Cardiac Enzymes No results for input(s): TROPONINI in the last 168 hours. ------------------------------------------------------------------------------------------------------------------  RADIOLOGY:  No results found.  EKG:   Orders placed or performed during the hospital encounter of 05/06/15  . ED EKG  . ED EKG  . EKG 12-Lead  . EKG 12-Lead    ASSESSMENT AND PLAN:   #1 symptomatic anemia, likely upper GI - Status post 2 units packed red blood cells hemoglobin has come from 5.9-8.1- 7.9 - Gastroenterology consultation appreciated possible EGD this afternoon - Continue Protonix infusion  #2 alcohol withdrawal - Fianc reports that he has not had any alcohol in 4 weeks, this is not consistent with the 48 hours that were reported yesterday. Symptoms are consistent with withdrawal with tachycardia, anxiety, hallucination. We'll continue on CIWA monitoring. Ammonia level is normal. UA negative for infection. He does have a mild white count, we'll get a chest x-ray. We'll also get an ABG. We'll also get CT scan of the head  #3 hypokalemia - Continue to replace and monitor, check magnesium and replace if needed  #4 elevated liver function tests - Improved. Lipase is normal. INR is normal. Platelets very slightly low. Will get abdominal ultrasound. Possible cirrhosis  CODE STATUS: Full   TOTAL TIME TAKING CARE OF THIS PATIENT: 25 minutes.  Greater than 50% of time spent in care coordination  and counseling. POSSIBLE D/C IN 2-3 DAYS, DEPENDING ON CLINICAL CONDITION.   Myrtis Ser M.D on 05/08/2015 at 1:23 PM  Between 7am to 6pm - Pager - [phone]  After 6pm go to [email or web address] - password [email or web address] [facility]  [facility]  Office  [phone]  CC: Primary care physician; Marinda Elk, MD

## 2015-05-08 NOTE — Progress Notes (Signed)
Dr Vianne Bulls  notifioed that pt continues to have temp erlevation 104.1rectal. Been on cooling blanket 15 minutes. md reports transfer to ccu asap. Continues  Thrashing and noncoherent.ccu

## 2015-05-08 NOTE — Progress Notes (Addendum)
Paged Dr. Volanda Napoleon regarding patient's status. Temp elevated to 103.2 axillary, lungs with rhonchi & sounding wet. Using accessory muscles for breathing. O2 sats ok at 95% room air.  Per Dr. Volanda Napoleon drop infusion to 29mL/hr, chest x-ray stat, ordered Vanc & Zosyn with pharm dosing, 2 sets of blood cultures, & UA.  Called respiratory after conversation with Dr. Volanda Napoleon since he's in distress.   Spoke to Dr. Volanda Napoleon again at 5:04pm. She wants to wait for chest xray before she orders lasix. She said to put a foley in & we can get a urine.

## 2015-05-08 NOTE — Consult Note (Signed)
  GI Inpatient Follow-up Note  Patient Identification: Manuel Jimenez is a 54 y.o. male  Subjective: Was agitated and confused this AM. Placed on ativan. Looks better but still somewhat confused. Ammonia level normal. INR elevated. LFT elevated. Was planning on EGD today but canceled by anesthesia due to possibility of withdrawal symptoms. Hgb stable for last few days.  Scheduled Inpatient Medications:  . folic acid  1 mg Oral Daily  . LORazepam  1 mg Oral Once  . multivitamin with minerals  1 tablet Oral Daily  . thiamine  100 mg Oral Daily   Or  . thiamine  100 mg Intravenous Daily    Continuous Inpatient Infusions:   . sodium chloride 100 mL/hr at 05/08/15 1225  . pantoprozole (PROTONIX) infusion 8 mg/hr (05/08/15 0443)    PRN Inpatient Medications:  acetaminophen **OR** acetaminophen, LORazepam **OR** LORazepam, morphine injection, oxyCODONE  Review of Systems: Constitutional: Weight is stable.  Eyes: No changes in vision. ENT: No oral lesions, sore throat.  GI: see HPI.  Heme/Lymph: No easy bruising.  CV: No chest pain.  GU: No hematuria.  Integumentary: No rashes.  Neuro: No headaches.  Psych: No depression/anxiety.  Endocrine: No heat/cold intolerance.  Allergic/Immunologic: No urticaria.  Resp: No cough, SOB.  Musculoskeletal: No joint swelling.    Physical Examination: BP 118/72 mmHg  Pulse 90  Temp(Src) 99.1 F (37.3 C) (Axillary)  Resp 18  Ht 5\' 11"  (1.803 m)  Wt 61.417 kg (135 lb 6.4 oz)  BMI 18.89 kg/m2  SpO2 100% Gen: NAD, alert and oriented x 4 HEENT: PEERLA, EOMI, Neck: supple, no JVD or thyromegaly Chest: CTA bilaterally, no wheezes, crackles, or other adventitious sounds CV: RRR, no m/g/c/r Abd: soft, NT, mildly distended, +BS in all four quadrants; no HSM, guarding, ridigity, or rebound tenderness Ext: no edema, well perfused with 2+ pulses, Skin: no rash or lesions noted Lymph: no LAD  Data: Lab Results  Component Value Date   WBC  12.4* 05/08/2015   HGB 7.9* 05/08/2015   HCT 25.3* 05/08/2015   MCV 80.7 05/08/2015   PLT 144* 05/08/2015    Recent Labs Lab 05/07/15 1041 05/07/15 1659 05/08/15 0454  HGB 8.1* 7.3* 7.9*   Lab Results  Component Value Date   NA 144 05/08/2015   K 3.5 05/08/2015   CL 116* 05/08/2015   CO2 21* 05/08/2015   BUN 13 05/08/2015   CREATININE 0.64 05/08/2015   Lab Results  Component Value Date   ALT 23 05/08/2015   AST 27 05/08/2015   ALKPHOS 136* 05/08/2015   BILITOT 1.4* 05/08/2015    Recent Labs Lab 05/08/15 1124  INR 1.48   Assessment/Plan: Mr. Manuel Jimenez is a 54 y.o. male with chronic liver disease. Likely has alcoholic cirrhosis.  Recommendations: Agree with liver U/S. Hold off on EGD or colonoscopy until mentation/withdrawal symptoms clears. Dr. Vira Agar will make decision over the weekend when to proceed with endoscopies. Please call with questions or concerns.  Kallie Depolo, Lupita Dawn, MD

## 2015-05-08 NOTE — Progress Notes (Addendum)
ANTIBIOTIC CONSULT NOTE - INITIAL  Pharmacy Consult for Vancomycin/Zosyn Indication: Aspsiration PNA  No Known Allergies  Patient Measurements: Height: 5\' 11"  (180.3 cm) Weight: 135 lb 6.4 oz (61.417 kg) IBW/kg (Calculated) : 75.3 Adjusted Body Weight: 61.4 kg  Vital Signs: Temp: 103.2 F (39.6 C) (10/07 1530) Temp Source: Axillary (10/07 1530) BP: 113/71 mmHg (10/07 1653) Pulse Rate: 124 (10/07 1653) Intake/Output from previous day: 10/06 0701 - 10/07 0700 In: 2873.3 [I.V.:2873.3] Out: -  Intake/Output from this shift: Total I/O In: 120 [P.O.:120] Out: -   Labs:  Recent Labs  05/06/15 2038 05/07/15 0532 05/07/15 1041 05/07/15 1659 05/08/15 0454  WBC 12.5* 11.3* 12.9* 11.7* 12.4*  HGB 5.9* 7.8* 8.1* 7.3* 7.9*  PLT 142* 130* 144* 131* 144*  CREATININE 0.67 0.53*  --   --  0.64   Estimated Creatinine Clearance: 90.6 mL/min (by C-G formula based on Cr of 0.64). No results for input(s): VANCOTROUGH, VANCOPEAK, VANCORANDOM, GENTTROUGH, GENTPEAK, GENTRANDOM, TOBRATROUGH, TOBRAPEAK, TOBRARND, AMIKACINPEAK, AMIKACINTROU, AMIKACIN in the last 72 hours.   Microbiology: No results found for this or any previous visit (from the past 720 hour(s)).  Medical History: Past Medical History  Diagnosis Date  . Hypertension     Medications:  Prescriptions prior to admission  Medication Sig Dispense Refill Last Dose  . Aspirin-Acetaminophen-Caffeine (GOODY HEADACHE PO) Take 1 packet by mouth as needed.   Past Week at Unknown time   Assessment: No Pseudomonas risk factors noted  CrCl = 90.6 ml/min Ke = 0.08 hr-1 T1/2 = 8.7 hrs Vd = 43 L  Goal of Therapy:  Vancomycin trough level 15-20 mcg/ml  Plan:  Expected duration 7 days with resolution of temperature and/or normalization of WBC   Zosyn 3.375 gm IV Q8H EI ordered to start on 10/07.  Vancomycin 1 gm IV X 1 given on 10/07 @ 18:30. Vancomycin 1 gm IV Q12H ordered to start 10/08 @ 1:30, 7 hrs after 1st dose .   This pt will reach Css by 10/9 @ 18:30. Will draw 1st trough on 10/10 @ 1:00.   Zaylie Gisler D 05/08/2015,5:06 PM

## 2015-05-08 NOTE — Progress Notes (Signed)
Spoke to Dr. Candace Cruise regarding patients ciwa status & how it might effect endo this afternoon. Dr. Candace Cruise said to update him around 11:30 in order to determine whether to cancel procedure or not.

## 2015-05-08 NOTE — Progress Notes (Signed)
Patient shows increased signs of confusion and agitation. Check CIWA score, Ativan 2mg  ordered per MD Rosilyn Mings 1x.

## 2015-05-08 NOTE — Progress Notes (Addendum)
Temp 104.3 .axillary. Dr Vianne Bulls  Notified to come see pt. md orders cooling blanket. Pt very restless, thrashing on bed.unable to take po . Requiring 2-3 staff to keep pt in beddr [place] arrive to see pt and ordered cdiff  Isolation and obtain stool  abd stat lactic acid and ammonia level. Pt will need tx to ccu. Family present , md to update family. md orders increase ivf to 100/hr

## 2015-05-08 NOTE — Clinical Documentation Improvement (Signed)
Internal Medicine  Can the diagnosis of anemia be further specified?   Acute blood loss anemia  Nutritional anemia, including the nutrition or mineral deficits  Chronic Anemia, including the suspected or known cause  Anemia of chronic disease, including the associated chronic disease state  Other  Clinically Undetermined  Document any associated diagnoses/conditions.   Supporting Information: Chief complaint: Fatigue History of heavy alcohol abuse  Per H&P: found to be anemic with hemoglobin of 5.9. Emergency department course: Transfusion 2 unit packed red blood cells initiated, Protonix drip initiated IMPRESSION AND PLAN:   54 year old Caucasian gentleman history of essential hypertension noncompliant with medications presenting with fatigue found to be anemic  1. Symptomatic anemia: Transfused 2 unit packed red blood cell denies initiated emergency department, Protonix injection followed by infusion continue for suspected GI bleed. Trend CBC every 6 hours, consult gastroenterology avoid further anticoagulate/antiplatelets medications     10/6 Progress Note #1 symptomatic anemia, likely upper GI - Status post 2 units packed red blood cells hemoglobin has come from 5.9-8.1 - Gastroenterology consultation is pending - Continue Protonix infusion  10/6 GI Consult Note Assessment/Plan: Mr. Dresch is a 54 y.o. male who presented with a severe anemia. Suspected GI bleed. Epigastric tenderness noted on exam. Alcoholism per history. [place] has been ordered. He may have a degree of Cirrhosis secondary to his alcoholism. As well as his hypokalemia and elevated liver enzymes.  Recommendations:  EGD to be performed in the morning per Dr. Candace Cruise. Continue with Protonix. Colonoscopy as an outpatient. Further recommendations to follow.     Component     Latest Ref Rng 05/06/2015 05/07/2015 05/07/2015 05/07/2015 05/08/2015          5:32 AM 10:41 AM  4:59 PM   Hemoglobin     13.0 - 18.0  g/dL 5.9 (L) 7.8 (L) 8.1 (L) 7.3 (L) 7.9 (L)  HCT     40.0 - 52.0 % 18.5 (L) 24.4 (L) 24.6 (L) 23.1 (L) 25.3 (L)   Treatments: GI consult Monitoring CBC Transfusing PRBCs IV NS@100ml /h  Please exercise your independent, professional judgment when responding. A specific answer is not anticipated or expected.   Thank You,  [place] [phone]

## 2015-05-08 NOTE — Progress Notes (Signed)
Called regarding patient fever, increased confusion and shortness of breath.  CXR clear, no fluid. No lasix needed. Will start nebulizer treatments prn shortness of breath.  Suspect that hyperthermia, hyperventilation and tachycardia are due to ETOH withdrawal with DT. Transfer to SDU. Increase CIWA protcol/coverage. Also obtain blood cultures, UA, start broad coverage with vanc zosyn for possible infection.

## 2015-05-09 ENCOUNTER — Inpatient Hospital Stay: Payer: Self-pay

## 2015-05-09 ENCOUNTER — Inpatient Hospital Stay: Payer: MEDICAID

## 2015-05-09 DIAGNOSIS — R195 Other fecal abnormalities: Secondary | ICD-10-CM

## 2015-05-09 DIAGNOSIS — R0602 Shortness of breath: Secondary | ICD-10-CM | POA: Insufficient documentation

## 2015-05-09 DIAGNOSIS — J690 Pneumonitis due to inhalation of food and vomit: Secondary | ICD-10-CM | POA: Insufficient documentation

## 2015-05-09 DIAGNOSIS — K922 Gastrointestinal hemorrhage, unspecified: Secondary | ICD-10-CM | POA: Insufficient documentation

## 2015-05-09 DIAGNOSIS — D649 Anemia, unspecified: Secondary | ICD-10-CM

## 2015-05-09 DIAGNOSIS — J9600 Acute respiratory failure, unspecified whether with hypoxia or hypercapnia: Secondary | ICD-10-CM

## 2015-05-09 LAB — COMPREHENSIVE METABOLIC PANEL
ALBUMIN: 1.7 g/dL — AB (ref 3.5–5.0)
ALK PHOS: 120 U/L (ref 38–126)
ALT: 20 U/L (ref 17–63)
ANION GAP: 8 (ref 5–15)
AST: 22 U/L (ref 15–41)
BUN: 12 mg/dL (ref 6–20)
CALCIUM: 7.5 mg/dL — AB (ref 8.9–10.3)
CO2: 22 mmol/L (ref 22–32)
Chloride: 117 mmol/L — ABNORMAL HIGH (ref 101–111)
Creatinine, Ser: 0.53 mg/dL — ABNORMAL LOW (ref 0.61–1.24)
GFR calc non Af Amer: >60 mL/min (ref >60)
GLUCOSE: 153 mg/dL — AB (ref 65–99)
POTASSIUM: 3.3 mmol/L — AB (ref 3.5–5.1)
SODIUM: 147 mmol/L — AB (ref 135–145)
Total Bilirubin: 1.1 mg/dL (ref 0.3–1.2)
Total Protein: 5.4 g/dL — ABNORMAL LOW (ref 6.5–8.1)

## 2015-05-09 LAB — BLOOD GAS, ARTERIAL
ACID-BASE EXCESS: 2.9 mmol/L (ref 0.0–3.0)
Acid-base deficit: 1.7 mmol/L (ref 0.0–2.0)
Allens test (pass/fail): POSITIVE — AB
Bicarbonate: 24.3 mEq/L (ref 21.0–28.0)
Bicarbonate: 27.9 mEq/L (ref 21.0–28.0)
FIO2: 1
FIO2: 40
MECHVT: 450 mL
O2 SAT: 88.8 %
O2 Saturation: 94.4 %
PCO2 ART: 43 mmHg (ref 32.0–48.0)
PEEP: 5 cmH2O
PH ART: 7.42 (ref 7.350–7.450)
Patient temperature: 37
Patient temperature: 37
RATE: 18 resp/min
pCO2 arterial: 45 mmHg (ref 32.0–48.0)
pH, Arterial: 7.34 — ABNORMAL LOW (ref 7.350–7.450)
pO2, Arterial: 77 mmHg — ABNORMAL LOW (ref 83.0–108.0)
pO2, Arterial: 55 mmHg — ABNORMAL LOW (ref 83.0–108.0)

## 2015-05-09 LAB — GLUCOSE, CAPILLARY
GLUCOSE-CAPILLARY: 147 mg/dL — AB (ref 65–99)
Glucose-Capillary: 150 mg/dL — ABNORMAL HIGH (ref 65–99)
Glucose-Capillary: 191 mg/dL — ABNORMAL HIGH (ref 65–99)

## 2015-05-09 LAB — CBC
HCT: 21.7 % — ABNORMAL LOW (ref 40.0–52.0)
HEMATOCRIT: 22.2 % — AB (ref 40.0–52.0)
HEMOGLOBIN: 6.9 g/dL — AB (ref 13.0–18.0)
Hemoglobin: 6.9 g/dL — ABNORMAL LOW (ref 13.0–18.0)
MCH: 25.5 pg — AB (ref 26.0–34.0)
MCH: 25.1 pg — ABNORMAL LOW (ref 26.0–34.0)
MCHC: 31.9 g/dL — AB (ref 32.0–36.0)
MCHC: 30.9 g/dL — ABNORMAL LOW (ref 32.0–36.0)
MCV: 79.9 fL — ABNORMAL LOW (ref 80.0–100.0)
MCV: 81 fL (ref 80.0–100.0)
PLATELETS: 130 10*3/uL — AB (ref 150–440)
Platelets: 126 10*3/uL — ABNORMAL LOW (ref 150–440)
RBC: 2.72 MIL/uL — ABNORMAL LOW (ref 4.40–5.90)
RBC: 2.74 MIL/uL — AB (ref 4.40–5.90)
RDW: 18 % — AB (ref 11.5–14.5)
RDW: 17.9 % — ABNORMAL HIGH (ref 11.5–14.5)
WBC: 8.1 10*3/uL (ref 3.8–10.6)
WBC: 8.8 10*3/uL (ref 3.8–10.6)

## 2015-05-09 LAB — MRSA PCR SCREENING: MRSA BY PCR: NEGATIVE

## 2015-05-09 LAB — LACTIC ACID, PLASMA: Lactic Acid, Venous: 0.9 mmol/L (ref 0.5–2.0)

## 2015-05-09 MED ORDER — IPRATROPIUM-ALBUTEROL 0.5-2.5 (3) MG/3ML IN SOLN
3.0000 mL | RESPIRATORY_TRACT | Status: DC
  Administered 2015-05-09 – 2015-05-11 (×12): 3 mL via RESPIRATORY_TRACT
  Filled 2015-05-09 (×12): qty 3

## 2015-05-09 MED ORDER — FUROSEMIDE 10 MG/ML IJ SOLN
INTRAMUSCULAR | Status: AC
  Administered 2015-05-09: 40 mg
  Filled 2015-05-09: qty 4

## 2015-05-09 MED ORDER — VITAL HIGH PROTEIN PO LIQD
1000.0000 mL | ORAL | Status: DC
  Administered 2015-05-09: 1000 mL
  Administered 2015-05-09: 20:00:00
  Administered 2015-05-10: 1000 mL
  Administered 2015-05-11: 07:00:00

## 2015-05-09 MED ORDER — FENTANYL CITRATE (PF) 100 MCG/2ML IJ SOLN
INTRAMUSCULAR | Status: AC
  Administered 2015-05-09: 100 ug via INTRAVENOUS
  Filled 2015-05-09: qty 2

## 2015-05-09 MED ORDER — VECURONIUM BROMIDE 10 MG IV SOLR
INTRAVENOUS | Status: AC
  Administered 2015-05-09: 10 mg via INTRAVENOUS
  Filled 2015-05-09: qty 10

## 2015-05-09 MED ORDER — FENTANYL 2500MCG IN NS 250ML (10MCG/ML) PREMIX INFUSION
INTRAVENOUS | Status: AC
  Administered 2015-05-09: 30 ug/h via INTRAVENOUS
  Filled 2015-05-09: qty 250

## 2015-05-09 MED ORDER — MIDAZOLAM HCL 2 MG/2ML IJ SOLN
4.0000 mg | Freq: Once | INTRAMUSCULAR | Status: AC
  Administered 2015-05-09: 4 mg via INTRAVENOUS

## 2015-05-09 MED ORDER — STERILE WATER FOR INJECTION IJ SOLN
INTRAMUSCULAR | Status: AC
  Administered 2015-05-09: 10 mL via INTRAMUSCULAR
  Filled 2015-05-09: qty 10

## 2015-05-09 MED ORDER — FREE WATER
200.0000 mL | Freq: Three times a day (TID) | Status: DC
  Administered 2015-05-09 – 2015-05-11 (×6): 200 mL

## 2015-05-09 MED ORDER — FENTANYL CITRATE (PF) 100 MCG/2ML IJ SOLN
100.0000 ug | Freq: Once | INTRAMUSCULAR | Status: AC
  Administered 2015-05-09: 100 ug via INTRAVENOUS

## 2015-05-09 MED ORDER — STERILE WATER FOR INJECTION IJ SOLN
10.0000 mL | Freq: Once | INTRAMUSCULAR | Status: AC
  Administered 2015-05-09: 10 mL via INTRAMUSCULAR

## 2015-05-09 MED ORDER — NOREPINEPHRINE BITARTRATE 1 MG/ML IV SOLN: 0.0000 ug/min | INTRAVENOUS | Status: DC

## 2015-05-09 MED ORDER — POTASSIUM CHLORIDE 10 MEQ/100ML IV SOLN
10.0000 meq | INTRAVENOUS | Status: AC
  Administered 2015-05-09 (×4): 10 meq via INTRAVENOUS
  Filled 2015-05-09 (×4): qty 100

## 2015-05-09 MED ORDER — VECURONIUM BROMIDE 10 MG IV SOLR
10.0000 mg | Freq: Once | INTRAVENOUS | Status: AC
  Administered 2015-05-09: 10 mg via INTRAVENOUS

## 2015-05-09 MED ORDER — FUROSEMIDE 10 MG/ML IJ SOLN
40.0000 mg | Freq: Once | INTRAMUSCULAR | Status: AC
  Administered 2015-05-09: 40 mg via INTRAVENOUS

## 2015-05-09 MED ORDER — FENTANYL 2500MCG IN NS 250ML (10MCG/ML) PREMIX INFUSION
10.0000 ug/h | INTRAVENOUS | Status: DC
  Administered 2015-05-09: 30 ug/h via INTRAVENOUS
  Administered 2015-05-09: 10 ug/h via INTRAVENOUS
  Administered 2015-05-10 – 2015-05-11 (×2): 300 ug/h via INTRAVENOUS
  Filled 2015-05-09 (×5): qty 250

## 2015-05-09 MED ORDER — NOREPINEPHRINE 4 MG/250ML-% IV SOLN
0.0000 ug/min | INTRAVENOUS | Status: DC
  Administered 2015-05-09: 4 ug/min via INTRAVENOUS
  Administered 2015-05-10: 5 ug/min via INTRAVENOUS
  Filled 2015-05-09 (×2): qty 250

## 2015-05-09 MED ORDER — MIDAZOLAM HCL 2 MG/2ML IJ SOLN
INTRAMUSCULAR | Status: AC
  Administered 2015-05-09: 4 mg via INTRAVENOUS
  Filled 2015-05-09: qty 4

## 2015-05-09 NOTE — Procedures (Signed)
Central Venous Catheter Placement: Indication: Patient receiving vesicant or irritant drug.; Patient receiving intravenous therapy for longer than 5 days.; Patient has limited or no vascular access.   Consent:emergent  Risks and benefits explained in detail including risk of infection, bleeding, respiratory failure and death..   Hand washing performed prior to starting the procedure.   Procedure: An active timeout was performed and correct patient, name, & ID confirmed.  After explaining risk and benefits, patient was positioned correctly for central venous access. Patient was prepped using strict sterile technique including chlorohexadine preps, sterile drape, sterile gown and sterile gloves.  The area was prepped, draped and anesthetized in the usual sterile manner. Patient comfort was obtained.  A triple lumen catheter was placed in RT  Internal Jugular Vein There was good blood return, catheter caps were placed on lumens, catheter flushed easily, the line was secured and a sterile dressing and BIO-PATCH applied.   Ultrasound was used to visualize vasculature and guidance of needle.   Number of Attempts: 1 Complications:none Estimated Blood Loss: none Chest Radiograph indicated and ordered.  Operator: Kitty Cadavid.   Manuel Jimenez, M.D.  [facility]  Medical Director ICU-[facility] Medical Director [facility] Cardio-P[facility] Department     

## 2015-05-09 NOTE — Progress Notes (Signed)
[facility] NAME: Manuel Jimenez    MR#:  [id number]  DATE OF BIRTH:  10/05/1960  SUBJECTIVE:  CHIEF COMPLAINT:   Chief Complaint  Patient presents with  . Fatigue   Sedated on precidex gtt.   REVIEW OF SYSTEMS:   Review of Systems  Constitutional: Negative for fever.  Respiratory: Negative for shortness of breath.   Cardiovascular: Negative for chest pain and palpitations.  Gastrointestinal: Positive for diarrhea. Negative for nausea, vomiting and abdominal pain.  Genitourinary: Negative for dysuria.    DRUG ALLERGIES:  No Known Allergies  VITALS:  Blood pressure 117/73, pulse 63, temperature 97.5 F (36.4 C), temperature source Rectal, resp. rate 29, height 5\' 11"  (1.803 m), weight 61.417 kg (135 lb 6.4 oz), SpO2 98 %.  PHYSICAL EXAMINATION:  GENERAL:  54 y.o.-year-old patient lying in the bed sedated, thin, moderate respiratory distress LUNGS: coarse breath sounds with diffuse rhonchi, short shallow respirations CARDIOVASCULAR: S1, S2 normal. No murmurs, rubs, or gallops. tachycardic ABDOMEN: Soft, tender to palpation in the upper quadrants, nondistended. Bowel sounds present. No organomegaly or mass.  EXTREMITIES: No pedal edema, cyanosis, or clubbing.  NEUROLOGIC: sedated PSYCHIATRIC: sedated  SKIN: No obvious rash, lesion, or ulcer.    LABORATORY PANEL:   CBC  Recent Labs Lab 05/09/15 0104  WBC 8.1  HGB 6.9*  HCT 21.7*  PLT 130*   ------------------------------------------------------------------------------------------------------------------  Chemistries   Recent Labs Lab 05/08/15 0454 05/09/15 0104  NA 144 147*  K 3.5 3.3*  CL 116* 117*  CO2 21* 22  GLUCOSE 116* 153*  BUN 13 12  CREATININE 0.64 0.53*  CALCIUM 7.9* 7.5*  MG 1.9  --   AST 27 22  ALT 23 20  ALKPHOS 136* 120  BILITOT 1.4* 1.1    ------------------------------------------------------------------------------------------------------------------  Cardiac Enzymes No results for input(s): TROPONINI in the last 168 hours. ------------------------------------------------------------------------------------------------------------------  RADIOLOGY:  Dg Chest 1 View  05/08/2015   CLINICAL DATA:  Fever, confusion.  EXAM: CHEST 1 VIEW  COMPARISON:  None.  FINDINGS: The heart size and mediastinal contours are within normal limits. Both lungs are clear. No pneumothorax or pleural effusion is noted. The visualized skeletal structures are unremarkable.  IMPRESSION: No acute cardiopulmonary abnormality seen.   Electronically Signed   By: Marijo Conception, M.D.   On: 05/08/2015 17:32   Dg Abd 1 View  05/09/2015   CLINICAL DATA:  Enteric tube placement. Respiratory failure. Aspiration pneumonia.  EXAM: ABDOMEN - 1 VIEW  COMPARISON:  None.  FINDINGS: Enteric tube terminates in the body of the stomach. No dilated small bowel loops. Minimal stool in the colon. No evidence of pneumatosis or pneumoperitoneum. Vascular calcifications in the pelvis.  IMPRESSION: Enteric tube terminates in the body of the stomach. Nonobstructive bowel gas pattern.   Electronically Signed   By: Ilona Sorrel M.D.   On: 05/09/2015 09:52   Ct Head Wo Contrast  05/08/2015   CLINICAL DATA:  Hallucination  EXAM: CT HEAD WITHOUT CONTRAST  TECHNIQUE: Contiguous axial images were obtained from the base of the skull through the vertex without intravenous contrast.  COMPARISON:  None.  FINDINGS: Mild global atrophy. No mass effect, midline shift, or acute intracranial hemorrhage. Mucus and retention cyst in the right maxillary sinus. Mastoid air cells are clear. Cranium is intact.  IMPRESSION: No acute intracranial pathology.   Electronically Signed   By: Marybelle Killings M.D.   On: 05/08/2015 15:22   US Abdomen Complete  05/09/2015   CLINICAL DATA:  Transaminitis.  EXAM:  ULTRASOUND ABDOMEN COMPLETE  COMPARISON:  None.  FINDINGS: Gallbladder: The gallbladder is nondistended. There is mild diffuse gallbladder wall thickening. There is a punctate 1 mm echogenic focus in the gallbladder fundus wall with the suggestion of reverberation artifact, likely indicating adenomyomatosis. No gallstones are demonstrated. No pericholecystic fluid or sonographic Murphy's sign.  Common bile duct: Diameter: 5 mm  Liver: There is occlusive or near occlusive acute thrombosis of the main portal vein. There is abnormal ill-defined heterogeneous hypoechogenicity of the liver parenchyma throughout the central left liver lobe.  IVC: No abnormality visualized.  Pancreas: Visualized portion unremarkable.  Spleen: Mild splenomegaly (craniocaudal splenic length 14.0 cm). No splenic mass.  Right Kidney: Length: 12.1 cm. Echogenicity within normal limits. No mass or hydronephrosis visualized.  Left Kidney: Length: 11.9 cm. Echogenicity within normal limits. No mass or hydronephrosis visualized.  Abdominal aorta: No aneurysm visualized.  Other findings: There is trace perihepatic and perisplenic ascites.  IMPRESSION: 1. 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 2. Abnormal ill-defined heterogeneous hypo echogenicity of the central left liver lobe parenchyma, cannot exclude a liver mass. Recommend further evaluation with MRI abdomen with and without intravenous contrast. If MRI is not feasible, a triphasic liver protocol CT of the abdomen with and without intravenous contrast is recommended. 3. Evidence of portal hypertension, including mild splenomegaly and trace upper abdominal ascites. 4. Nonspecific mild diffuse gallbladder wall thickening, with no cholelithiasis and no sonographic Murphy's sign. The gallbladder wall thickening likely reflects noninflammatory edema. No biliary ductal dilatation.   Electronically Signed   By: Ilona Sorrel M.D.   On: 05/09/2015 08:44   Dg Chest Port 1  View  05/09/2015   CLINICAL DATA:  Intubated. Respiratory failure. Acute aspiration pneumonia.  EXAM: PORTABLE CHEST 1 VIEW  COMPARISON:  Chest radiograph from earlier today.  FINDINGS: Endotracheal tube tip is 2.8 cm above the carina. Right internal jugular central venous catheter terminates at the cavoatrial junction. Enteric tube enters the stomach, with its tip not seen on this image. Stable cardiomediastinal silhouette with normal heart size. There is significantly improved aeration of the right lung, with persistent prominent parahilar consolidation in the right lung. Small right pleural effusion. No left pleural effusion. Clear left lung. No pneumothorax.  IMPRESSION: 1. Well-positioned endotracheal and enteric tubes and right IJ central venous catheter. No pneumothorax. 2. Significantly improved right lung aeration with persistent prominent right parahilar consolidation, likely representing atelectasis and/or pneumonia given the normal chest radiograph 1 day prior. 3. Small right pleural effusion.   Electronically Signed   By: Ilona Sorrel M.D.   On: 05/09/2015 09:54   Dg Chest Port 1 View  05/09/2015   CLINICAL DATA:  Acute onset of shortness of breath. Initial encounter.  EXAM: PORTABLE CHEST 1 VIEW  COMPARISON:  Chest radiograph performed 05/08/2015  FINDINGS: There is diffuse opacification of the right hemithorax, reflecting a large right-sided pleural effusion and diffuse airspace opacification. Right-sided volume loss is noted, with rightward mediastinal shift. The left lung appears clear. No pneumothorax is seen.  The cardiomediastinal silhouette is not well assessed due to the pleural effusion. No acute osseous abnormalities are identified.  IMPRESSION: Diffuse opacification of the right hemithorax, reflecting a large right-sided pleural effusion and diffuse airspace opacification. Right-sided volume loss, with rightward mediastinal shift. Would perform follow-up chest radiograph after treatment  of the acute process, to exclude an underlying mass.   Electronically Signed   By: Jacqulynn Cadet  Chang M.D.   On: 05/09/2015 03:58    EKG:   Orders placed or performed during the hospital encounter of 05/06/15  . ED EKG  . ED EKG  . EKG 12-Lead  . EKG 12-Lead    ASSESSMENT AND PLAN:   #1 symptomatic anemia, likely upper GI - Status post 2 units packed red blood cells hemoglobin has come from 5.9-8.1- 7.9-- 6.9 - Continue Protonix infusion - possible EGD Monday if stable - transfuse 1 u prbc today  #2 alcohol withdrawal/DT - likely cause of confusion and hallucination. - continue sedation - CT head negative for acute process  #3 acute respiratory failure with hypoxia due to aspiration on 10/7 - appreciate pulmonology consultation. Now intubated and plan for bronchoscopy today. - broad spectrum antibiotics, blood and sputum cultures pending  #4 portal vein thrombus - unable to anticoagulate due to ongoing bleeding - continue to monitor, likely due to cirrhosis  #5 possible hepatic mass - will need CT when more stable. - Hepatitis and HIV pending  #6 hypokalemia - replace, check MG - ccu electrolyte replacement protocol  #7 fever - due to aspriation  CODE STATUS: Full   TOTAL TIME TAKING CARE OF THIS PATIENT: 25 minutes.  Greater than 50% of time spent in care coordination and counseling. Care plan discussed with the patient's mother and fiance as well as with CCU Dr. Mortimer Fries and GI Dr Tiffany Kocher. POSSIBLE D/C IN ? DAYS, DEPENDING ON CLINICAL CONDITION.   Myrtis Ser M.D on 05/09/2015 at 12:51 PM  Between 7am to 6pm - Pager - [phone]  After 6pm go to [email or web address] - password [email or web address] [facility]  [facility]  Office  [phone]  CC: Primary care physician; Marinda Elk, MD

## 2015-05-09 NOTE — Progress Notes (Signed)
Nutrition Follow-up  DOCUMENTATION CODES:   Severe malnutrition in context of acute illness/injury  INTERVENTION:   EN: received verbal order from MD Kasa to start TF; recommend starting Vital High Protein using the Adult Tube Feeding Protocol; once pt on vent at least 24 hours, will assess needs using [employer] equation and re-establish goal rate.   NUTRITION DIAGNOSIS:   Inadequate oral intake related to inability to eat as evidenced by NPO status.  GOAL:   Patient will meet greater than or equal to 90% of their needs  MONITOR:    (Energy Intake, Glucose Profile, Electrolyte and Renal Profile, Hepatic Profile, Digestive system)  REASON FOR ASSESSMENT:   Consult Enteral/tube feeding initiation and management  ASSESSMENT:   Pt admitted with symptomatic anemia per MD note, likely from upper GI bleed. Per MD note, pt with h/o heavy EtOH use, on CIWA, and with abnormal liver function tests.  Pt developed hyperthermia, hyperventilation and tachycardia likely due to EtOH withdrawal with DTs; pt became agitated with increased work of breathing. Required intubation this AM  Diet Order:  Diet NPO time specified  Skin:  Reviewed, no issues  Last BM:  10/7   Electrolyte and Renal Profile:  Recent Labs Lab 05/06/15 2038 05/07/15 0532 05/08/15 0454 05/09/15 0104  BUN 13 12 13 12   CREATININE 0.67 0.53* 0.64 0.53*  NA 136 138 144 147*  K 2.4* 3.1* 3.5 3.3*  MG 1.8  --  1.9  --   PHOS 2.4*  --   --   --    Glucose Profile: No results for input(s): GLUCAP in the last 72 hours. Nutritional Anemia Profile:  CBC Latest Ref Rng 05/09/2015 05/08/2015 05/07/2015  WBC 3.8 - 10.6 K/uL 8.1 12.4(H) 11.7(H)  Hemoglobin 13.0 - 18.0 g/dL 6.9(L) 7.9(L) 7.3(L)  Hematocrit 40.0 - 52.0 % 21.7(L) 25.3(L) 23.1(L)  Platelets 150 - 440 K/uL 130(L) 144(L) 131(L)    Meds: NS at 100 ml/hr, lasix, MVI  Height:   Ht Readings from Last 1 Encounters:  05/07/15 5\' 11"  (1.803 m)    Weight:    Wt Readings from Last 1 Encounters:  05/07/15 135 lb 6.4 oz (61.417 kg)    BMI:  Body mass index is 18.89 kg/(m^2).  Estimated Nutritional Needs:   Kcal:  assess using [employer] equation on follow-up  Protein:  92-122 g (1.5-2.0 g/kg)   Fluid:  1950-2332mL of fluid (25-61mL/kg)  EDUCATION NEEDS:   Education needs no appropriate at this time  [place], [place], LDN [phone] Pager

## 2015-05-09 NOTE — Consult Note (Signed)
GI Inpatient Follow-up Note  Patient Identification: Manuel Jimenez is a 54 y.o. male who presented to the ED for progressing fatigue over a 3 week period.  History of heavy alcohol abuse.    Subjective: On admission hemoglobin noted to be 5.9 gm/dl and potassium level of 2.4. Alk Phos elevated and total bilirubin 1.3. He denies hematemesis, epigastric pain, diarrhea, constipation, dark stools or bright red blood per rectum. Hemoglobin up to 8.1 gm/dl after receiving 2 units of PRBCs. No evidence of bleeding noted.   Upper EGD was scheduled for eval, however had to be cancelled due to mentation/withdrawl symptoms.  Patient has since then been moved to ICU and placed on a ventilator for resp failure with acute aspiration pneumonia.    Scheduled Inpatient Medications:  . feeding supplement (VITAL HIGH PROTEIN)  1,000 mL Per Tube Q24H  . folic acid  1 mg Oral Daily  . free water  200 mL Per Tube 3 times per day  . ipratropium-albuterol  3 mL Nebulization Q4H  . LORazepam  1 mg Oral Once  . multivitamin with minerals  1 tablet Oral Daily  . piperacillin-tazobactam (ZOSYN)  IV  3.375 g Intravenous 3 times per day  . potassium chloride  10 mEq Intravenous Q1 Hr x 4  . thiamine  100 mg Oral Daily   Or  . thiamine  100 mg Intravenous Daily  . vancomycin  1,000 mg Intravenous Q12H    Continuous Inpatient Infusions:   . sodium chloride Stopped (05/09/15 0400)  . fentaNYL infusion INTRAVENOUS 30 mcg/hr (05/09/15 0900)  . norepinephrine    . pantoprozole (PROTONIX) infusion 8 mg/hr (05/08/15 0443)    PRN Inpatient Medications:  acetaminophen **OR** acetaminophen, LORazepam **OR** LORazepam, LORazepam, oxyCODONE  Review of Systems: Unable to perform due to condition.  Physical Examination: BP 117/73 mmHg  Pulse 63  Temp(Src) 97.5 F (36.4 C) (Rectal)  Resp 29  Ht 5' 11"  (1.803 m)  Wt 61.417 kg (135 lb 6.4 oz)  BMI 18.89 kg/m2  SpO2 98% Gen: on vent Neck: supple, no JVD or  thyromegaly Chest: on ventilator, rales bilaterally CV: RRR, no m/g/c/r Abd: soft, NT, ND, +BS in all four quadrants; no HSM, guarding, ridigity, or rebound tenderness Ext: no edema, well perfused with 2+ pulses, Skin: no rash or lesions noted Lymph: no LAD  Data: Lab Results  Component Value Date   WBC 8.1 05/09/2015   HGB 6.9* 05/09/2015   HCT 21.7* 05/09/2015   MCV 79.9* 05/09/2015   PLT 130* 05/09/2015    Recent Labs Lab 05/07/15 1659 05/08/15 0454 05/09/15 0104  HGB 7.3* 7.9* 6.9*   Lab Results  Component Value Date   NA 147* 05/09/2015   K 3.3* 05/09/2015   CL 117* 05/09/2015   CO2 22 05/09/2015   BUN 12 05/09/2015   CREATININE 0.53* 05/09/2015   Lab Results  Component Value Date   ALT 20 05/09/2015   AST 22 05/09/2015   ALKPHOS 120 05/09/2015   BILITOT 1.1 05/09/2015    Recent Labs Lab 05/08/15 1124  INR 1.48  Imaging:  CLINICAL DATA: Transaminitis.  EXAM: ULTRASOUND ABDOMEN COMPLETE  COMPARISON: None.  FINDINGS: Gallbladder: The gallbladder is nondistended. There is mild diffuse gallbladder wall thickening. There is a punctate 1 mm echogenic focus in the gallbladder fundus wall with the suggestion of reverberation artifact, likely indicating adenomyomatosis. No gallstones are demonstrated. No pericholecystic fluid or sonographic Murphy's sign.  Common bile duct: Diameter: 5 mm  Liver: There is occlusive  or near occlusive acute thrombosis of the main portal vein. There is abnormal ill-defined heterogeneous hypoechogenicity of the liver parenchyma throughout the central left liver lobe.  IVC: No abnormality visualized.  Pancreas: Visualized portion unremarkable.  Spleen: Mild splenomegaly (craniocaudal splenic length 14.0 cm). No splenic mass.  Right Kidney: Length: 12.1 cm. Echogenicity within normal limits. No mass or hydronephrosis visualized.  Left Kidney: Length: 11.9 cm. Echogenicity within normal limits. No mass  or hydronephrosis visualized.  Abdominal aorta: No aneurysm visualized.  Other findings: There is trace perihepatic and perisplenic ascites.  IMPRESSION: 1. 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 2. Abnormal ill-defined heterogeneous hypo echogenicity of the central left liver lobe parenchyma, cannot exclude a liver mass. Recommend further evaluation with MRI abdomen with and without intravenous contrast. If MRI is not feasible, a triphasic liver protocol CT of the abdomen with and without intravenous contrast is recommended. 3. Evidence of portal hypertension, including mild splenomegaly and trace upper abdominal ascites. 4. Nonspecific mild diffuse gallbladder wall thickening, with no cholelithiasis and no sonographic Murphy's sign. The gallbladder wall thickening likely reflects noninflammatory edema. No biliary ductal dilatation.   Electronically Signed  By: Ilona Sorrel M.D.  On: 05/09/2015 08:44 Assessment/Plan: Mr. Ozer is a 54 y.o. male with symptomatic anemia, low albumin secondary to severe malnutrition probably related to alcoholism.  He was scheduled to have an EGD for evaluation, but had to cancel due to withdrawal concerns.  US abdomen showed near occlusive to occlusive portal vein thrombosis, possible lever mass, and evidence of portal hypertension, including mild splenomegaly and trace upper abdominal ascites.  Recommendations: Anticoagulation is recommended in patients with PVT, however, due to the low Hgb and severe low albumin, we do not recommend it at this time.  We recommend that after patient is off the ventilator, that he proceed with an upper endoscopy and colonoscopy. We also recommend further evaluation for predisposing conditions, such as hepatocellular carcinoma.  [place] with doppler, CT abdomen and MRI abdomen. We recommend checking AFP. We will continue to follow with you. Please call with questions or concerns.  Salvadore Farber, PA-C  I personally performed these services.

## 2015-05-09 NOTE — Consult Note (Signed)
[facility] Consultation      Name: Manuel Jimenez MRN: [id number] DOB: Apr 25, 1961    ADMISSION DATE:  05/06/2015    CHIEF COMPLAINT:  Acute resp failure    HISTORY OF PRESENT ILLNESS  54 yo white male seen for resp failure with acute aspiration pneumonia Patient with acute res distress, using accessory muscles to breathe, patient emergently intubated OG and central lien placed   SIGNIFICANT EVENTS  10/8 intubated 10/8 central line 10/8 bronch  PAST MEDICAL HISTORY    :  Past Medical History  Diagnosis Date  . Hypertension    History reviewed. No pertinent past surgical history. Prior to Admission medications   Medication Sig Start Date End Date Taking? Authorizing Provider  Aspirin-Acetaminophen-Caffeine (GOODY HEADACHE PO) Take 1 packet by mouth as needed.   Yes Historical Provider, MD   No Known Allergies   FAMILY HISTORY   Family History  Problem Relation Age of Onset  . CAD Father       SOCIAL HISTORY    reports that he has quit smoking. He has never used smokeless tobacco. He reports that he drinks alcohol. He reports that he does not use illicit drugs.  Review of Systems  Unable to perform ROS: critical illness      VITAL SIGNS    Temp:  [97.5 F (36.4 C)-104.3 F (40.2 C)] 97.5 F (36.4 C) (10/08 0700) Pulse Rate:  [63-124] 63 (10/08 0700) Resp:  [19-38] 29 (10/08 0700) BP: (77-141)/(55-73) 117/73 mmHg (10/08 0700) SpO2:  [90 %-99 %] 98 % (10/08 0418) HEMODYNAMICS:   VENTILATOR SETTINGS:   INTAKE / OUTPUT:  Intake/Output Summary (Last 24 hours) at 05/09/15 0855 Last data filed at 05/09/15 0600  Gross per 24 hour  Intake    635 ml  Output   2175 ml  Net  -1540 ml       PHYSICAL EXAM   Physical Exam  Constitutional: He appears distressed.  HENT:  Head: Normocephalic and atraumatic.  Eyes: Pupils are equal, round, and reactive to light. No scleral icterus.  Neck: Normal range of motion. Neck  supple.  Cardiovascular: Normal rate and regular rhythm.   No murmur heard. Pulmonary/Chest: He is in respiratory distress. He has wheezes. He has rales.  resp distress  Abdominal: Soft. He exhibits no distension. There is no tenderness.  Musculoskeletal: He exhibits no edema.  Neurological: He displays normal reflexes. Coordination normal.  gcs<8T  Skin: Skin is warm. No rash noted. He is diaphoretic.       LABS   LABS:  CBC  Recent Labs Lab 05/07/15 1659 05/08/15 0454 05/09/15 0104  WBC 11.7* 12.4* 8.1  HGB 7.3* 7.9* 6.9*  HCT 23.1* 25.3* 21.7*  PLT 131* 144* 130*   Coag's  Recent Labs Lab 05/06/15 2038 05/08/15 1124  INR 1.37 1.48   BMET  Recent Labs Lab 05/07/15 0532 05/08/15 0454 05/09/15 0104  NA 138 144 147*  K 3.1* 3.5 3.3*  CL 106 116* 117*  CO2 24 21* 22  BUN 12 13 12   CREATININE 0.53* 0.64 0.53*  GLUCOSE 116* 116* 153*   Electrolytes  Recent Labs Lab 05/06/15 2038 05/07/15 0532 05/08/15 0454 05/09/15 0104  CALCIUM 7.5* 7.7* 7.9* 7.5*  MG 1.8  --  1.9  --   PHOS 2.4*  --   --   --    Sepsis Markers  Recent Labs Lab 05/08/15 1806 05/09/15 0104  LATICACIDVEN 1.4 0.9   ABG  Recent Labs Lab 05/08/15  1340 05/09/15 0404  PHART 7.52* 7.34*  PCO2ART 27* 45  PO2ART 82* 77*   Liver Enzymes  Recent Labs Lab 05/06/15 2038 05/08/15 0454 05/09/15 0104  AST 27 27 22   ALT 28 23 20   ALKPHOS 150* 136* 120  BILITOT 1.3* 1.4* 1.1  ALBUMIN 1.8* 1.9* 1.7*   Cardiac Enzymes No results for input(s): TROPONINI, PROBNP in the last 168 hours. Glucose No results for input(s): GLUCAP in the last 168 hours.   Recent Results (from the past 240 hour(s))  MRSA PCR Screening     Status: None   Collection Time: 05/08/15  7:48 PM  Result Value Ref Range Status   MRSA by PCR NEGATIVE NEGATIVE Final    Comment:        The GeneXpert MRSA Assay (FDA approved for NASAL specimens only), is one component of a comprehensive MRSA  colonization surveillance program. It is not intended to diagnose MRSA infection nor to guide or monitor treatment for MRSA infections.      Current facility-administered medications:  .  0.9 %  sodium chloride infusion, , Intravenous, Continuous, Epifanio Lesches, MD, Stopped at 05/09/15 0400 .  acetaminophen (TYLENOL) tablet 650 mg, 650 mg, Oral, Q6H PRN **OR** acetaminophen (TYLENOL) suppository 650 mg, 650 mg, Rectal, Q6H PRN, Lytle Butte, MD, 650 mg at 05/09/15 0047 .  dexmedetomidine (PRECEDEX) 400 MCG/100ML (4 mcg/mL) infusion, 0.4-1.2 mcg/kg/hr, Intravenous, PRN, [name], MD, 1 mcg/hr at 05/09/15 [phone] .  fentaNYL (SUBLIMAZE) 100 MCG/2ML injection, , , ,  .  fentaNYL 10 mcg/ml infusion, , , ,  .  folic acid (FOLVITE) tablet 1 mg, 1 mg, Oral, Daily, Aldean Jewett, MD, 1 mg at 05/07/15 1031 .  ipratropium-albuterol (DUONEB) 0.5-2.5 (3) MG/3ML nebulizer solution 3 mL, 3 mL, Nebulization, Q4H PRN, Aldean Jewett, MD .  LORazepam (ATIVAN) tablet 1 mg, 1 mg, Oral, Q6H PRN **OR** LORazepam (ATIVAN) injection 1 mg, 1 mg, Intravenous, Q6H PRN, Aldean Jewett, MD, 1 mg at 05/08/15 1536 .  LORazepam (ATIVAN) injection 2-3 mg, 2-3 mg, Intravenous, Q1H PRN, Aldean Jewett, MD, 2 mg at 05/09/15 [phone] .  LORazepam (ATIVAN) tablet 1 mg, 1 mg, Oral, Once, Aldean Jewett, MD, 1 mg at 05/08/15 0754 .  midazolam (VERSED) 2 MG/2ML injection, , , ,  .  multivitamin with minerals tablet 1 tablet, 1 tablet, Oral, Daily, Aldean Jewett, MD, 1 tablet at 05/07/15 1029 .  oxyCODONE (Oxy IR/ROXICODONE) immediate release tablet 5 mg, 5 mg, Oral, Q4H PRN, Lytle Butte, MD .  pantoprazole (PROTONIX) 80 mg in sodium chloride 0.9 % 250 mL (0.32 mg/mL) infusion, 8 mg/hr, Intravenous, Continuous, Lisa Roca, MD, Last Rate: 25 mL/hr at 05/08/15 0443, 8 mg/hr at 05/08/15 0443 .  piperacillin-tazobactam (ZOSYN) IVPB 3.375 g, 3.375 g, Intravenous, 3 times per day, Aldean Jewett,  MD, 3.375 g at 05/09/15 0523 .  potassium chloride 10 mEq in 100 mL IVPB, 10 mEq, Intravenous, Q1 Hr x 4, Aldean Jewett, MD .  sterile water (preservative free) injection, , , ,  .  thiamine (VITAMIN B-1) tablet 100 mg, 100 mg, Oral, Daily **OR** thiamine (B-1) injection 100 mg, 100 mg, Intravenous, Daily, Aldean Jewett, MD, 100 mg at 05/08/15 1114 .  vancomycin (VANCOCIN) IVPB 1000 mg/200 mL premix, 1,000 mg, Intravenous, Q12H, Aldean Jewett, MD, 1,000 mg at 05/09/15 0057 .  vecuronium (NORCURON) 10 MG injection, , , ,   IMAGING    Dg Chest 1 View  05/08/2015   CLINICAL DATA:  Fever, confusion.  EXAM: CHEST 1 VIEW  COMPARISON:  None.  FINDINGS: The heart size and mediastinal contours are within normal limits. Both lungs are clear. No pneumothorax or pleural effusion is noted. The visualized skeletal structures are unremarkable.  IMPRESSION: No acute cardiopulmonary abnormality seen.   Electronically Signed   By: Marijo Conception, M.D.   On: 05/08/2015 17:32   Ct Head Wo Contrast  05/08/2015   CLINICAL DATA:  Hallucination  EXAM: CT HEAD WITHOUT CONTRAST  TECHNIQUE: Contiguous axial images were obtained from the base of the skull through the vertex without intravenous contrast.  COMPARISON:  None.  FINDINGS: Mild global atrophy. No mass effect, midline shift, or acute intracranial hemorrhage. Mucus and retention cyst in the right maxillary sinus. Mastoid air cells are clear. Cranium is intact.  IMPRESSION: No acute intracranial pathology.   Electronically Signed   By: Marybelle Killings M.D.   On: 05/08/2015 15:22   US Abdomen Complete  05/09/2015   CLINICAL DATA:  Transaminitis.  EXAM: ULTRASOUND ABDOMEN COMPLETE  COMPARISON:  None.  FINDINGS: Gallbladder: The gallbladder is nondistended. There is mild diffuse gallbladder wall thickening. There is a punctate 1 mm echogenic focus in the gallbladder fundus wall with the suggestion of reverberation artifact, likely indicating adenomyomatosis. No  gallstones are demonstrated. No pericholecystic fluid or sonographic Murphy's sign.  Common bile duct: Diameter: 5 mm  Liver: There is occlusive or near occlusive acute thrombosis of the main portal vein. There is abnormal ill-defined heterogeneous hypoechogenicity of the liver parenchyma throughout the central left liver lobe.  IVC: No abnormality visualized.  Pancreas: Visualized portion unremarkable.  Spleen: Mild splenomegaly (craniocaudal splenic length 14.0 cm). No splenic mass.  Right Kidney: Length: 12.1 cm. Echogenicity within normal limits. No mass or hydronephrosis visualized.  Left Kidney: Length: 11.9 cm. Echogenicity within normal limits. No mass or hydronephrosis visualized.  Abdominal aorta: No aneurysm visualized.  Other findings: There is trace perihepatic and perisplenic ascites.  IMPRESSION: 1. 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 2. Abnormal ill-defined heterogeneous hypo echogenicity of the central left liver lobe parenchyma, cannot exclude a liver mass. Recommend further evaluation with MRI abdomen with and without intravenous contrast. If MRI is not feasible, a triphasic liver protocol CT of the abdomen with and without intravenous contrast is recommended. 3. Evidence of portal hypertension, including mild splenomegaly and trace upper abdominal ascites. 4. Nonspecific mild diffuse gallbladder wall thickening, with no cholelithiasis and no sonographic Murphy's sign. The gallbladder wall thickening likely reflects noninflammatory edema. No biliary ductal dilatation.   Electronically Signed   By: Ilona Sorrel M.D.   On: 05/09/2015 08:44   Dg Chest Port 1 View  05/09/2015   CLINICAL DATA:  Acute onset of shortness of breath. Initial encounter.  EXAM: PORTABLE CHEST 1 VIEW  COMPARISON:  Chest radiograph performed 05/08/2015  FINDINGS: There is diffuse opacification of the right hemithorax, reflecting a large right-sided pleural effusion and diffuse airspace  opacification. Right-sided volume loss is noted, with rightward mediastinal shift. The left lung appears clear. No pneumothorax is seen.  The cardiomediastinal silhouette is not well assessed due to the pleural effusion. No acute osseous abnormalities are identified.  IMPRESSION: Diffuse opacification of the right hemithorax, reflecting a large right-sided pleural effusion and diffuse airspace opacification. Right-sided volume loss, with rightward mediastinal shift. Would perform follow-up chest radiograph after treatment of the acute process, to exclude an underlying mass.   Electronically Signed   By:  Garald Balding M.D.   On: 05/09/2015 03:58      Indwelling Urinary Catheter continued, requirement due to   Reason to continue Indwelling Urinary Catheter for strict Intake/Output monitoring for hemodynamic instability   Central Line continued, requirement due to   Reason to continue [employer] Monitoring of central venous pressure or other hemodynamic parameters   Ventilator continued, requirement due to, resp failure    Ventilator Sedation RASS 0 to -2      INDWELLING DEVICES:: 10/8 ETT#8 10/8 RT IJ CVL  MICRO DATA: MRSA PCR >>neg Flu>>neg Urine  Blood Resp   ANTIMICROBIALS:     ASSESSMENT/PLAN   54 yo white male with acute hypoxic resp failure with acute RT sided opacification likely aspiration pneumonia with effusion Metabolic encephlopathy Intubated and placed on full vent supprot  PULMONARY -Respiratory Failure -continue Full MV support -continue Bronchodilator Therapy -Wean Fio2 and PEEP as tolerated -will perform SAT/SBt when respiratory parameters are met -plan for Bronch today   CARDIOVASCULAR Monitor BP, needs ICU  RENAL Follow chem y Follow UO -needs foley  GASTROINTESTINAL -plan for EGD -needs GI recs for management of Portal Vein thrombosis -conider CT abd with contrast to assess for abd mass  HEMATOLOGIC Follow  h/h  INFECTIOUS Aspiration pneumonia -empiric abx -obtain BAL -check HIV and Hep panel  ENDOCRINE - ICU hypoglycemic\Hyperglycemia protocol   NEUROLOGIC - intubated and sedated - minimal sedation to achieve a RASS goal: -1     I have personally obtained a history, examined the patient, evaluated laboratory and independently reviewed  imaging results, formulated the assessment and plan and placed orders.  The Patient requires high complexity decision making for assessment and support, frequent evaluation and titration of therapies, application of advanced monitoring technologies and extensive interpretation of multiple databases. Critical Care Time devoted to patient care services described in this note is 60 minutes.   Overall, patient is critically ill, prognosis is guarded. Patient at high risk for cardiac arrest and death.    Corrin Parker, M.D.  [facility]  Medical Director [place] Director [facility] Cardio-Pulmonary Department

## 2015-05-09 NOTE — Progress Notes (Addendum)
Pt agitated and required NRB to maintain sats >88%.  Rales b/l on physical exam.  Lasix ordered and CXR ordered.  Latter shows white out of R lung.  Presumably due to effusion which could certainly cause agitation.  Will obtain ABG.  Ativan given for agitation due to increased WOB.  Will try BiPAP if ABG is reassuring.

## 2015-05-09 NOTE — Procedures (Signed)
Endotracheal Intubation: Patient required placement of an artificial airway secondary to resp failure.   Consent: Emergent.   Hand washing performed prior to starting the procedure.   Medications administered for sedation prior to procedure: Midazolam 4 mg IV,  Vecuronium 10 mg IV, Fentanyl 100 mcg IV.   Procedure: A time out procedure was called and correct patient, name, & ID confirmed. Needed supplies and equipment were assembled and checked to include ETT, 10 ml syringe, Glidescope, Mac and Miller blades, suction, oxygen and bag mask valve, end tidal CO2 monitor. Patient was positioned to align the mouth and pharynx to facilitate visualization of the glottis.  Heart rate, SpO2 and blood pressure was continuously monitored during the procedure. Pre-oxygenation was conducted prior to intubation and endotracheal tube was placed through the vocal cords into the trachea.  During intubation an assistant applied gentle pressure to the cricoid cartilage.   The artificial airway was placed under direct visualization via glidescope route using a 8.0 ETT on the first attempt.    ETT was secured at 23 cm mark.    Placement was confirmed by auscuitation of lungs with good breath sounds bilaterally and no stomach sounds.  Condensation was noted on endotracheal tube.  Pulse ox 100%.  CO2 detector in place with appropriate color change.   Complications: None .   Operator: Zaylah Blecha.   Chest radiograph ordered and pending.   Comments: OGT placed via glidescope.  Jannet Calip David Davidlee Jeanbaptiste, M.D.  [facility]  Medical Director ICU-[facility] [place] Medical Director [facility] Cardio-Pulmonary Department       

## 2015-05-09 NOTE — Progress Notes (Signed)
ANTIBIOTIC CONSULT NOTE - INITIAL  Pharmacy Consult for Vancomycin/Zosyn Indication: Aspsiration PNA  No Known Allergies  Patient Measurements: Height: 5\' 11"  (180.3 cm) Weight: 135 lb 6.4 oz (61.417 kg) IBW/kg (Calculated) : 75.3 Adjusted Body Weight: 61.4 kg  Vital Signs: Temp: 99 F (37.2 C) (10/08 0200) BP: 96/63 mmHg (10/08 0200) Pulse Rate: 68 (10/08 0418) Intake/Output from previous day: 10/07 0701 - 10/08 0700 In: 635 [P.O.:360; I.V.:275] Out: 2175 [Urine:2175] Intake/Output from this shift:    Labs:  Recent Labs  05/07/15 0532  05/07/15 1659 05/08/15 0454 05/09/15 0104  WBC 11.3*  < > 11.7* 12.4* 8.1  HGB 7.8*  < > 7.3* 7.9* 6.9*  PLT 130*  < > 131* 144* 130*  CREATININE 0.53*  --   --  0.64 0.53*  < > = values in this interval not displayed. Estimated Creatinine Clearance: 90.6 mL/min (by C-G formula based on Cr of 0.53). No results for input(s): VANCOTROUGH, VANCOPEAK, VANCORANDOM, GENTTROUGH, GENTPEAK, GENTRANDOM, TOBRATROUGH, TOBRAPEAK, TOBRARND, AMIKACINPEAK, AMIKACINTROU, AMIKACIN in the last 72 hours.   Microbiology: Recent Results (from the past 720 hour(s))  MRSA PCR Screening     Status: None   Collection Time: 05/08/15  7:48 PM  Result Value Ref Range Status   MRSA by PCR NEGATIVE NEGATIVE Final    Comment:        The GeneXpert MRSA Assay (FDA approved for NASAL specimens only), is one component of a comprehensive MRSA colonization surveillance program. It is not intended to diagnose MRSA infection nor to guide or monitor treatment for MRSA infections.     Medical History: Past Medical History  Diagnosis Date  . Hypertension     Medications:  Prescriptions prior to admission  Medication Sig Dispense Refill Last Dose  . Aspirin-Acetaminophen-Caffeine (GOODY HEADACHE PO) Take 1 packet by mouth as needed.   Past Week at Unknown time   Assessment: 54 y/o M with symptomatic anemia and alcohol withdrawal started on broad-spectrum  antibiotics due to fever and SOB.   Goal of Therapy:  Vancomycin trough level 15-20 mcg/ml  Plan:  Continue Zosyn 3.375 gm IV Q8H EI ordered to start on 10/07.  Will continue vancomycin 1000 mg iv q 12 hours with a trough scheduled with the 5th dose. Will f/u culture results and renal function.    Ulice Dash D 05/09/2015,7:50 AM

## 2015-05-09 NOTE — Procedures (Signed)
PROCEDURE: BRONCHOSCOPY Therapeutic Aspiration of Tracheobronchial Tree  PROCEDURE DATE: 05/09/2015  TIME:  NAME:  Manuel Jimenez  DOB:Dec 24, 1960  MRN: [id number] LOC:  IC09A/IC09A-AA    HOSP DAY: @LENGTHOFSTAYDAYS @ CODE STATUS:      Code Status Orders        Start     Ordered   05/06/15 2220  Full code   Continuous     05/06/15 2219          Indications/Preliminary Diagnosis: aspiration pneumonia  Consent: (Place X beside choice/s below)  The benefits, risks and possible complications of the procedure were        explained to:  ___ patient  ___ patient's family  _x__ other:__emergent_________  who verbalized understanding and gave:  ___ verbal  ___ written  ___ verbal and written  ___ telephone  ___ other:________ consent.      Unable to obtain consent; procedure performed on emergent basis.     Other:       PRESEDATION ASSESSMENT: History and Physical has been performed. Patient meds and allergies have been reviewed. Presedation airway examination has been performed and documented. Baseline vital signs, sedation score, oxygenation status, and cardiac rhythm were reviewed. Patient was deemed to be in satisfactory condition to undergo the procedure.         PROCEDURE DETAILS: Timeout performed and correct patient, name, & ID confirmed. Following prep per Pulmonary policy, appropriate sedation was administered. The Bronchoscope was inserted in to oral cavity with bite block in place. Therapeutic aspiration of Tracheobronchial tree was performed.  Airway exam proceeded with findings, technical procedures, and specimen collection as noted below. At the end of exam the scope was withdrawn without incident. Impression and Plan as noted below.           Airway Prep (Place X beside choice below)   1% Transtracheal Lidocaine Anesthetization 7 cc   Patient prepped per Bronchoscopy Lab Policy       Insertion Route (Place X beside choice below)   Nasal   Oral  x  Endotracheal Tube   Tracheostomy   INTRAPROCEDURE MEDICATIONS:  Sedative/Narcotic Amt Dose   Versed  mg   Fentanyl  mcg  Diprivan  mg       Medication Amt Dose  Medication Amt Dose  Lidocaine 1%  cc  Epinephrine 1:10,000 sol  cc  Xylocaine 4%  cc  Cocaine  cc   TECHNICAL PROCEDURES: (Place X beside choice below)   Procedures  Description    None     Electrocautery     Cryotherapy     Balloon Dilatation     Bronchography     Stent Placement   x  Therapeutic Aspiration Extensive amounts of purulent secretions extracted with bronchoscope    Laser/Argon Plasma    Brachytherapy Catheter Placement    Foreign Body Removal         SPECIMENS (Sites): (Place X beside choice below)  Specimens Description   No Specimens Obtained     Washings   x Lavage RML lavage   Biopsies    Fine Needle Aspirates    Brushings    Sputum    FINDINGS: extensive amounts of purulent secretions through out lung segments/airways  ESTIMATED BLOOD LOSS: none  COMPLICATIONS/RESOLUTION: none      IMPRESSION:POST-PROCEDURE DX: aspiration pneumonia    RECOMMENDATION/PLAN: follow up BAL cultures     Corrin Parker, M.D.  [facility]  Medical Director [place] Director [place]  Department

## 2015-05-09 NOTE — Progress Notes (Signed)
   05/09/15 0900  Clinical Encounter Type  Visited With Patient and family together  Visit Type Initial  Referral From Nurse  Consult/Referral To Chaplain  Spiritual Encounters  Spiritual Needs Prayer;Emotional  Stress Factors  Patient Stress Factors Exhausted;Health changes  Family Stress Factors Major life changes;Loss;Exhausted;Family relationships  Escorted mother to patient's room. Provided pastoral care & prayer. Mother has experienced several losses during past months (husband & son).  [place]. Syd Newsome G. [place]

## 2015-05-09 NOTE — Progress Notes (Signed)
Hemoglobin 6.9, no orders to transfuse will recheck this afternoon and if change will contact MD. Dr Mortimer Fries consulted, bronchoscopy.

## 2015-05-10 ENCOUNTER — Inpatient Hospital Stay: Payer: Self-pay

## 2015-05-10 DIAGNOSIS — R410 Disorientation, unspecified: Secondary | ICD-10-CM | POA: Insufficient documentation

## 2015-05-10 DIAGNOSIS — R140 Abdominal distension (gaseous): Secondary | ICD-10-CM | POA: Insufficient documentation

## 2015-05-10 DIAGNOSIS — J189 Pneumonia, unspecified organism: Secondary | ICD-10-CM | POA: Insufficient documentation

## 2015-05-10 LAB — GLUCOSE, CAPILLARY
GLUCOSE-CAPILLARY: 178 mg/dL — AB (ref 65–99)
GLUCOSE-CAPILLARY: 139 mg/dL — AB (ref 65–99)
GLUCOSE-CAPILLARY: 120 mg/dL — AB (ref 65–99)
Glucose-Capillary: 184 mg/dL — ABNORMAL HIGH (ref 65–99)
Glucose-Capillary: 130 mg/dL — ABNORMAL HIGH (ref 65–99)

## 2015-05-10 LAB — CBC
HCT: 25.7 % — ABNORMAL LOW (ref 40.0–52.0)
Hemoglobin: 7.9 g/dL — ABNORMAL LOW (ref 13.0–18.0)
MCH: 25.1 pg — ABNORMAL LOW (ref 26.0–34.0)
MCHC: 30.9 g/dL — ABNORMAL LOW (ref 32.0–36.0)
MCV: 81.1 fL (ref 80.0–100.0)
Platelets: 201 10*3/uL (ref 150–440)
RBC: 3.17 MIL/uL — ABNORMAL LOW (ref 4.40–5.90)
RDW: 18 % — ABNORMAL HIGH (ref 11.5–14.5)
WBC: 18.5 10*3/uL — ABNORMAL HIGH (ref 3.8–10.6)

## 2015-05-10 LAB — BASIC METABOLIC PANEL
Anion gap: 7 (ref 5–15)
BUN: 16 mg/dL (ref 6–20)
CALCIUM: 7.8 mg/dL — AB (ref 8.9–10.3)
CO2: 26 mmol/L (ref 22–32)
Chloride: 114 mmol/L — ABNORMAL HIGH (ref 101–111)
Creatinine, Ser: 0.75 mg/dL (ref 0.61–1.24)
GFR calc Af Amer: >60 mL/min (ref >60)
GLUCOSE: 188 mg/dL — AB (ref 65–99)
POTASSIUM: 3.3 mmol/L — AB (ref 3.5–5.1)
Sodium: 147 mmol/L — ABNORMAL HIGH (ref 135–145)

## 2015-05-10 LAB — PREPARE RBC (CROSSMATCH)

## 2015-05-10 LAB — MAGNESIUM: MAGNESIUM: 2 mg/dL (ref 1.7–2.4)

## 2015-05-10 LAB — HEMOGLOBIN AND HEMATOCRIT, BLOOD
HCT: 25.3 % — ABNORMAL LOW (ref 40.0–52.0)
Hemoglobin: 7.9 g/dL — ABNORMAL LOW (ref 13.0–18.0)

## 2015-05-10 LAB — PHOSPHORUS: Phosphorus: 1.7 mg/dL — ABNORMAL LOW (ref 2.5–4.6)

## 2015-05-10 MED ORDER — SODIUM CHLORIDE 0.9 % IV SOLN
Freq: Once | INTRAVENOUS | Status: AC
  Administered 2015-05-10: 19:00:00 via INTRAVENOUS

## 2015-05-10 MED ORDER — INSULIN ASPART 100 UNIT/ML ~~LOC~~ SOLN
0.0000 [IU] | Freq: Three times a day (TID) | SUBCUTANEOUS | Status: DC
  Administered 2015-05-10 – 2015-05-11 (×3): 1 [IU] via SUBCUTANEOUS
  Filled 2015-05-10 (×3): qty 1

## 2015-05-10 MED ORDER — DEXTROSE 5 % IV SOLN
15.0000 mmol | Freq: Once | INTRAVENOUS | Status: AC
  Administered 2015-05-10: 15 mmol via INTRAVENOUS
  Filled 2015-05-10: qty 5

## 2015-05-10 MED ORDER — POTASSIUM CHLORIDE 10 MEQ/100ML IV SOLN
10.0000 meq | INTRAVENOUS | Status: AC
  Administered 2015-05-10 (×4): 10 meq via INTRAVENOUS
  Filled 2015-05-10 (×4): qty 100

## 2015-05-10 MED ORDER — MIDAZOLAM HCL 2 MG/2ML IJ SOLN: 1.0000 mg | INTRAMUSCULAR | Status: DC | PRN

## 2015-05-10 NOTE — Progress Notes (Signed)
[facility] Progress Note Patient Name: Manuel Jimenez DOB: May 24, 1961 MRN: [id number]   Date of Service  05/10/2015  HPI/Events of Note  Agitation. Patient reaching for ETT. Nurse requests Versed IV PRN for less hemodynamic effect than ordered Ativan.   eICU Interventions  Will order: 1. Versed 1 mg IV Q 1 hour PRN agitation.      Intervention Category Minor Interventions: Agitation / anxiety - evaluation and management  Sommer,Steven Eugene 05/10/2015, 11:52 PM

## 2015-05-10 NOTE — Consult Note (Signed)
Pt with heavy hx of alcohol use and likely cirrhosis with portal vein thrombosis who has intubated and ventilated due to aspiration pneumonia partly due to altered mental status.  Exam today shows good air flow,coarse sounds somewhat.  Due to anemia  I need to do an EGD tomorrow and will do with him on ventilator.  Plan for mid afternoon.  His male friend was present in room and I discussed this briefly with her. WBC 18, hgb 7.9, plt 201. Critical care notes noted.

## 2015-05-10 NOTE — Progress Notes (Signed)
[facility] NAME: Manuel Jimenez    MR#:  [id number]  DATE OF BIRTH:  1961-05-26  SUBJECTIVE:  CHIEF COMPLAINT:   Chief Complaint  Patient presents with  . Fatigue   Sedated on fentanyl drip for mechanical ventilation  REVIEW OF SYSTEMS:   Review of Systems  Constitutional: Negative for fever.  Respiratory: Negative for shortness of breath.   Cardiovascular: Negative for chest pain and palpitations.  Gastrointestinal: Positive for diarrhea. Negative for nausea, vomiting and abdominal pain.  Genitourinary: Negative for dysuria.    DRUG ALLERGIES:  No Known Allergies  VITALS:  Blood pressure 90/61, pulse 89, temperature 100.4 F (38 C), temperature source Rectal, resp. rate 16, height 5\' 11"  (1.803 m), weight 65.9 kg (145 lb 4.5 oz), SpO2 98 %.  PHYSICAL EXAMINATION:  GENERAL:  54 y.o.-year-old patient lying in the bed sedated, thin, on mechanical ventilation LUNGS: coarse breath sounds, good air movement CARDIOVASCULAR: S1, S2 normal. No murmurs, rubs, or gallops. tachycardic ABDOMEN: Soft, tender to palpation in the upper quadrants, nondistended. Bowel sounds present. No organomegaly or mass.  EXTREMITIES: No pedal edema, cyanosis, or clubbing.  NEUROLOGIC: sedated PSYCHIATRIC: sedated  SKIN: No obvious rash, lesion, or ulcer.    LABORATORY PANEL:   CBC  Recent Labs Lab 05/10/15 0415  WBC 18.5*  HGB 7.9*  HCT 25.7*  PLT 201   ------------------------------------------------------------------------------------------------------------------  Chemistries   Recent Labs Lab 05/09/15 0104 05/10/15 0415  NA 147* 147*  K 3.3* 3.3*  CL 117* 114*  CO2 22 26  GLUCOSE 153* 188*  BUN 12 16  CREATININE 0.53* 0.75  CALCIUM 7.5* 7.8*  MG  --  2.0  AST 22  --   ALT 20  --   ALKPHOS 120  --   BILITOT 1.1  --     ------------------------------------------------------------------------------------------------------------------  Cardiac Enzymes No results for input(s): TROPONINI in the last 168 hours. ------------------------------------------------------------------------------------------------------------------  RADIOLOGY:  Dg Chest 1 View  05/08/2015   CLINICAL DATA:  Fever, confusion.  EXAM: CHEST 1 VIEW  COMPARISON:  None.  FINDINGS: The heart size and mediastinal contours are within normal limits. Both lungs are clear. No pneumothorax or pleural effusion is noted. The visualized skeletal structures are unremarkable.  IMPRESSION: No acute cardiopulmonary abnormality seen.   Electronically Signed   By: Marijo Conception, M.D.   On: 05/08/2015 17:32   Dg Abd 1 View  05/10/2015   CLINICAL DATA:  Abdominal distension.  Assess nasogastric placement.  EXAM: ABDOMEN - 1 VIEW  COMPARISON:  05/09/2015  FINDINGS: Tube enters the abdomen and passes along the course of the stomach with tip in the region of the antrum. No small or large bowel dilatation. No significant bone finding. Mild vascular calcification.  IMPRESSION: Tube tip in the region of the gastric antrum.  Gas pattern normal.   Electronically Signed   By: Nelson Chimes M.D.   On: 05/10/2015 09:42   Dg Abd 1 View  05/09/2015   CLINICAL DATA:  Enteric tube placement. Respiratory failure. Aspiration pneumonia.  EXAM: ABDOMEN - 1 VIEW  COMPARISON:  None.  FINDINGS: Enteric tube terminates in the body of the stomach. No dilated small bowel loops. Minimal stool in the colon. No evidence of pneumatosis or pneumoperitoneum. Vascular calcifications in the pelvis.  IMPRESSION: Enteric tube terminates in the body of the stomach. Nonobstructive bowel gas pattern.   Electronically Signed   By: Ilona Sorrel M.D.   On: 05/09/2015  09:52   Ct Head Wo Contrast  05/08/2015   CLINICAL DATA:  Hallucination  EXAM: CT HEAD WITHOUT CONTRAST  TECHNIQUE: Contiguous axial images  were obtained from the base of the skull through the vertex without intravenous contrast.  COMPARISON:  None.  FINDINGS: Mild global atrophy. No mass effect, midline shift, or acute intracranial hemorrhage. Mucus and retention cyst in the right maxillary sinus. Mastoid air cells are clear. Cranium is intact.  IMPRESSION: No acute intracranial pathology.   Electronically Signed   By: Marybelle Killings M.D.   On: 05/08/2015 15:22   US Abdomen Complete  05/09/2015   CLINICAL DATA:  Transaminitis.  EXAM: ULTRASOUND ABDOMEN COMPLETE  COMPARISON:  None.  FINDINGS: Gallbladder: The gallbladder is nondistended. There is mild diffuse gallbladder wall thickening. There is a punctate 1 mm echogenic focus in the gallbladder fundus wall with the suggestion of reverberation artifact, likely indicating adenomyomatosis. No gallstones are demonstrated. No pericholecystic fluid or sonographic Murphy's sign.  Common bile duct: Diameter: 5 mm  Liver: There is occlusive or near occlusive acute thrombosis of the main portal vein. There is abnormal ill-defined heterogeneous hypoechogenicity of the liver parenchyma throughout the central left liver lobe.  IVC: No abnormality visualized.  Pancreas: Visualized portion unremarkable.  Spleen: Mild splenomegaly (craniocaudal splenic length 14.0 cm). No splenic mass.  Right Kidney: Length: 12.1 cm. Echogenicity within normal limits. No mass or hydronephrosis visualized.  Left Kidney: Length: 11.9 cm. Echogenicity within normal limits. No mass or hydronephrosis visualized.  Abdominal aorta: No aneurysm visualized.  Other findings: There is trace perihepatic and perisplenic ascites.  IMPRESSION: 1. 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 2. Abnormal ill-defined heterogeneous hypo echogenicity of the central left liver lobe parenchyma, cannot exclude a liver mass. Recommend further evaluation with MRI abdomen with and without intravenous contrast. If MRI is not feasible, a  triphasic liver protocol CT of the abdomen with and without intravenous contrast is recommended. 3. Evidence of portal hypertension, including mild splenomegaly and trace upper abdominal ascites. 4. Nonspecific mild diffuse gallbladder wall thickening, with no cholelithiasis and no sonographic Murphy's sign. The gallbladder wall thickening likely reflects noninflammatory edema. No biliary ductal dilatation.   Electronically Signed   By: Ilona Sorrel M.D.   On: 05/09/2015 08:44   Dg Chest Port 1 View  05/09/2015   CLINICAL DATA:  Intubated. Respiratory failure. Acute aspiration pneumonia.  EXAM: PORTABLE CHEST 1 VIEW  COMPARISON:  Chest radiograph from earlier today.  FINDINGS: Endotracheal tube tip is 2.8 cm above the carina. Right internal jugular central venous catheter terminates at the cavoatrial junction. Enteric tube enters the stomach, with its tip not seen on this image. Stable cardiomediastinal silhouette with normal heart size. There is significantly improved aeration of the right lung, with persistent prominent parahilar consolidation in the right lung. Small right pleural effusion. No left pleural effusion. Clear left lung. No pneumothorax.  IMPRESSION: 1. Well-positioned endotracheal and enteric tubes and right IJ central venous catheter. No pneumothorax. 2. Significantly improved right lung aeration with persistent prominent right parahilar consolidation, likely representing atelectasis and/or pneumonia given the normal chest radiograph 1 day prior. 3. Small right pleural effusion.   Electronically Signed   By: Ilona Sorrel M.D.   On: 05/09/2015 09:54   Dg Chest Port 1 View  05/09/2015   CLINICAL DATA:  Acute onset of shortness of breath. Initial encounter.  EXAM: PORTABLE CHEST 1 VIEW  COMPARISON:  Chest radiograph performed 05/08/2015  FINDINGS: There is diffuse  opacification of the right hemithorax, reflecting a large right-sided pleural effusion and diffuse airspace opacification. Right-sided  volume loss is noted, with rightward mediastinal shift. The left lung appears clear. No pneumothorax is seen.  The cardiomediastinal silhouette is not well assessed due to the pleural effusion. No acute osseous abnormalities are identified.  IMPRESSION: Diffuse opacification of the right hemithorax, reflecting a large right-sided pleural effusion and diffuse airspace opacification. Right-sided volume loss, with rightward mediastinal shift. Would perform follow-up chest radiograph after treatment of the acute process, to exclude an underlying mass.   Electronically Signed   By: Garald Balding M.D.   On: 05/09/2015 03:58    EKG:   Orders placed or performed during the hospital encounter of 05/06/15  . ED EKG  . ED EKG  . EKG 12-Lead  . EKG 12-Lead    ASSESSMENT AND PLAN:   #1 symptomatic anemia, likely upper GI in the setting of heavy alcohol use and heavy use of Goody powders - Status post 2 units packed red blood cells hemoglobin has come from 5.9-8.1- 7.9-- 6.9 - Continue Protonix infusion - Plan is for EGD Monday if stable - transfuse 2 u prbc today  #2 alcohol withdrawal/DT - likely cause of confusion and hallucination. - continue sedation - CT head negative for acute process - Weaned from sedation as tolerated, continue CIWA monitoring  #3 acute respiratory failure with hypoxia due to aspiration on 10/7 - appreciate pulmonology consultation.  - Respiratory status and chest x-ray improved since bronchoscopy yesterday - broad spectrum antibiotics, blood and BAL cultures pending  #4 portal vein thrombus - unable to anticoagulate due to ongoing bleeding - continue to monitor, likely due to cirrhosis  #5 possible hepatic mass - will need CT when stable. - Hepatitis and HIV pending  #6 hypokalemia - replace, check MG - ccu electrolyte replacement protocol  #7 fever - due to aspriation  CODE STATUS: Full   TOTAL TIME TAKING CARE OF THIS PATIENT: 25 minutes.  Greater than  50% of time spent in care coordination and counseling. Care plan discussed with the patient's mother and fiance as well as with CCU Dr. Mortimer Fries and GI Dr Tiffany Kocher. POSSIBLE D/C IN ? DAYS, DEPENDING ON CLINICAL CONDITION.   Myrtis Ser M.D on 05/10/2015 at 2:17 PM  Between 7am to 6pm - Pager - [phone]  After 6pm go to [email or web address] - password [email or web address] [facility]  [facility]  Office  [phone]  CC: Primary care physician; Marinda Elk, MD

## 2015-05-10 NOTE — Progress Notes (Signed)
Electrolyte replacement consult  Pharmacy Consult for electrolyte replacement  No Known Allergies  Patient Measurements: Height: 5\' 11"  (180.3 cm) Weight: 145 lb 4.5 oz (65.9 kg) IBW/kg (Calculated) : 75.3  Vital Signs: Temp: 100.4 F (38 C) (10/09 0700) Temp Source: Rectal (10/09 0400) BP: 90/61 mmHg (10/09 0700) Pulse Rate: 89 (10/09 0700) ILabs:  Recent Labs  05/08/15 1124 05/09/15 0104 05/09/15 1343 05/10/15 0415  WBC  --  8.1 8.8 18.5*  HGB  --  6.9* 6.9* 7.9*  HCT  --  21.7* 22.2* 25.7*  PLT  --  130* 126* 201  INR 1.48  --   --   --      Recent Labs  05/08/15 0454 05/09/15 0104 05/10/15 0415  NA 144 147* 147*  K 3.5 3.3* 3.3*  CL 116* 117* 114*  CO2 21* 22 26  GLUCOSE 116* 153* 188*  BUN 13 12 16   CREATININE 0.64 0.53* 0.75  CALCIUM 7.9* 7.5* 7.8*  MG 1.9  --  2.0  PHOS  --   --  1.7*  PROT 5.8* 5.4*  --   ALBUMIN 1.9* 1.7*  --   AST 27 22  --   ALT 23 20  --   ALKPHOS 136* 120  --   BILITOT 1.4* 1.1  --    Estimated Creatinine Clearance: 97.2 mL/min (by C-G formula based on Cr of 0.75).   MAGNESIUM  Date/Time Value Ref Range Status  05/10/2015 04:15 AM 2.0 1.7 - 2.4 mg/dL Final     Recent Labs  05/09/15 2139 05/10/15 0401 05/10/15 0710  GLUCAP 191* 184* 178*    Medical History: Past Medical History  Diagnosis Date  . Hypertension    Assessment: Pharmacy consulted to monitor and replace electrolytes in this 54 year old male. Patient currently intubated and sedated. Potassium, phosphorus low.   Plan:  Potassium replacement ordered by MD (44mEq IV x 4), will supplement phosphorous. Ordered 33mmol potassium phosphate IV X 1. Will recheck with AM labs.   Rexene Edison, PharmD Clinical Pharmacist  05/10/2015 10:59 AM

## 2015-05-10 NOTE — Progress Notes (Signed)
ANTIBIOTIC CONSULT NOTE - INITIAL  Pharmacy Consult for Vancomycin/Zosyn Indication: Aspsiration PNA  No Known Allergies  Patient Measurements: Height: 5\' 11"  (180.3 cm) Weight: 145 lb 4.5 oz (65.9 kg) IBW/kg (Calculated) : 75.3 Adjusted Body Weight: 61.4 kg  Vital Signs: Temp: 100.4 F (38 C) (10/09 0700) Temp Source: Rectal (10/09 0400) BP: 90/61 mmHg (10/09 0700) Pulse Rate: 89 (10/09 0700) Intake/Output from previous day: 10/08 0701 - 10/09 0700 In: 1899.4 [I.V.:919.4; NG/GT:680; IV Piggyback:300] Out: 1380 [Urine:1380] Intake/Output from this shift:    Labs:  Recent Labs  05/08/15 0454 05/09/15 0104 05/09/15 1343 05/10/15 0415  WBC 12.4* 8.1 8.8 18.5*  HGB 7.9* 6.9* 6.9* 7.9*  PLT 144* 130* 126* 201  CREATININE 0.64 0.53*  --  0.75   Estimated Creatinine Clearance: 97.2 mL/min (by C-G formula based on Cr of 0.75). No results for input(s): VANCOTROUGH, VANCOPEAK, VANCORANDOM, GENTTROUGH, GENTPEAK, GENTRANDOM, TOBRATROUGH, TOBRAPEAK, TOBRARND, AMIKACINPEAK, AMIKACINTROU, AMIKACIN in the last 72 hours.   Microbiology: Recent Results (from the past 720 hour(s))  Culture, blood (routine x 2)     Status: None (Preliminary result)   Collection Time: 05/08/15  6:05 PM  Result Value Ref Range Status   Specimen Description BLOOD LEFT AC  Final   Special Requests   Final    BOTTLES DRAWN AEROBIC AND ANAEROBIC  AER 2 CC ANA 8CC   Culture NO GROWTH 2 DAYS  Final   Report Status PENDING  Incomplete  Culture, blood (routine x 2)     Status: None (Preliminary result)   Collection Time: 05/08/15  6:25 PM  Result Value Ref Range Status   Specimen Description BLOOD RIGHT AC  Final   Special Requests   Final    BOTTLES DRAWN AEROBIC AND ANAEROBIC  AER 1CC ANA [place]   Culture NO GROWTH 2 DAYS  Final   Report Status PENDING  Incomplete  MRSA PCR Screening     Status: None   Collection Time: 05/08/15  7:48 PM  Result Value Ref Range Status   MRSA by PCR NEGATIVE NEGATIVE  Final    Comment:        The GeneXpert MRSA Assay (FDA approved for NASAL specimens only), is one component of a comprehensive MRSA colonization surveillance program. It is not intended to diagnose MRSA infection nor to guide or monitor treatment for MRSA infections.   Culture, bal-quantitative     Status: None (Preliminary result)   Collection Time: 05/09/15  9:20 AM  Result Value Ref Range Status   Specimen Description BRONCHIAL ALVEOLAR LAVAGE  Final   Special Requests NONE  Final   Gram Stain PENDING  Incomplete   Culture   Final    LIGHT GROWTH UNIDENTIFIED ORGANISM IDENTIFICATION TO FOLLOW    Report Status PENDING  Incomplete    Medical History: Past Medical History  Diagnosis Date  . Hypertension     Medications:  Prescriptions prior to admission  Medication Sig Dispense Refill Last Dose  . Aspirin-Acetaminophen-Caffeine (GOODY HEADACHE PO) Take 1 packet by mouth as needed.   Past Week at Unknown time   Assessment: 54 y/o M with symptomatic anemia and alcohol withdrawal started on broad-spectrum antibiotics due to fever and SOB.   Goal of Therapy:  Vancomycin trough level 15-20 mcg/ml  Plan:  Continue Zosyn 3.375 gm IV Q8H EI ordered to start on 10/07.  Will continue vancomycin 1000 mg iv q 12 hours with a trough scheduled with the 5th dose. Will f/u culture results and renal function.  Ulice Dash D 05/10/2015,11:05 AM

## 2015-05-10 NOTE — Progress Notes (Signed)
Spoke with Dr Vira Agar and Volanda Napoleon orders to transfuse 1 unit. Recheck hemoglobin 1 hour after transfusion complete. If hemoglobin greater then 8.5 do not give second unit. Held maintenance fluids while blood transfusing.

## 2015-05-10 NOTE — Consult Note (Signed)
EGD to be done tomorrow afternoon in CCU while patient intubated and sedated well.

## 2015-05-10 NOTE — Consult Note (Signed)
[facility] Consultation      Name: Manuel Jimenez MRN: [id number] DOB: [birth year]    ADMISSION DATE:  05/06/2015    CHIEF COMPLAINT:  Acute resp failure    HISTORY OF PRESENT ILLNESS  54 yo white male seen for resp failure with acute aspiration pneumonia Patient with acute res distress, using accessory muscles to breathe, patient emergently intubated OG and central lien placed CXR shows improvement after bronch, family at bedside updated fio2 at 50% Will wean vent and fio2 and plan for SAT/SBt in next 24 hrs    SIGNIFICANT EVENTS  10/8 intubated 10/8 central line 10/8 bronch  PAST MEDICAL HISTORY    :  Past Medical History  Diagnosis Date  . Hypertension    History reviewed. No pertinent past surgical history. Prior to Admission medications   Medication Sig Start Date End Date Taking? Authorizing Provider  Aspirin-Acetaminophen-Caffeine (GOODY HEADACHE PO) Take 1 packet by mouth as needed.   Yes Historical Provider, MD   No Known Allergies   FAMILY HISTORY   Family History  Problem Relation Age of Onset  . CAD Father       SOCIAL HISTORY    reports that he has quit smoking. He has never used smokeless tobacco. He reports that he drinks alcohol. He reports that he does not use illicit drugs.  Review of Systems  Unable to perform ROS: critical illness      VITAL SIGNS    Temp:  [97.7 F (36.5 C)-101.3 F (38.5 C)] 100.4 F (38 C) (10/09 0700) Pulse Rate:  [69-98] 89 (10/09 0700) Resp:  [12-23] 16 (10/09 0700) BP: (70-100)/(56-70) 90/61 mmHg (10/09 0700) SpO2:  [91 %-100 %] 98 % (10/09 0750) FiO2 (%):  [35 %-50 %] 35 % (10/09 0752) Weight:  [142 lb 13.7 oz (64.8 kg)-145 lb 4.5 oz (65.9 kg)] 145 lb 4.5 oz (65.9 kg) (10/09 0500) HEMODYNAMICS:   VENTILATOR SETTINGS: Vent Mode:  [-] PRVC FiO2 (%):  [35 %-50 %] 35 % Set Rate:  [18 bmp] 18 bmp Vt Set:  [450 mL] 450 mL PEEP:  [5 cmH20] 5 cmH20 Plateau Pressure:  [16 cmH20]  16 cmH20 INTAKE / OUTPUT:  Intake/Output Summary (Last 24 hours) at 05/10/15 1020 Last data filed at 05/10/15 0700  Gross per 24 hour  Intake 1799.35 ml  Output   1380 ml  Net 419.35 ml       PHYSICAL EXAM   Physical Exam  Constitutional: He appears distressed.  HENT:  Head: Normocephalic and atraumatic.  Eyes: Pupils are equal, round, and reactive to light. No scleral icterus.  Neck: Normal range of motion. Neck supple.  Cardiovascular: Normal rate and regular rhythm.   No murmur heard. Pulmonary/Chest: He is in respiratory distress. He has wheezes. He has rales.  resp distress  Abdominal: Soft. He exhibits no distension. There is no tenderness.  Musculoskeletal: He exhibits no edema.  Neurological: He displays normal reflexes. Coordination normal.  gcs<8T  Skin: Skin is warm. No rash noted. He is diaphoretic.       LABS   LABS:  CBC  Recent Labs Lab 05/09/15 0104 05/09/15 1343 05/10/15 0415  WBC 8.1 8.8 18.5*  HGB 6.9* 6.9* 7.9*  HCT 21.7* 22.2* 25.7*  PLT 130* 126* 201   Coag's  Recent Labs Lab 05/06/15 2038 05/08/15 1124  INR 1.37 1.48   BMET  Recent Labs Lab 05/08/15 0454 05/09/15 0104 05/10/15 0415  NA 144 147* 147*  K 3.5 3.3* 3.3*  CL 116* 117* 114*  CO2 21* 22 26  BUN 13 12 16   CREATININE 0.64 0.53* 0.75  GLUCOSE 116* 153* 188*   Electrolytes  Recent Labs Lab 05/06/15 2038  05/08/15 0454 05/09/15 0104 05/10/15 0415  CALCIUM 7.5*  < > 7.9* 7.5* 7.8*  MG 1.8  --  1.9  --  2.0  PHOS 2.4*  --   --   --  1.7*  < > = values in this interval not displayed. Sepsis Markers  Recent Labs Lab 05/08/15 1806 05/09/15 0104  LATICACIDVEN 1.4 0.9   ABG  Recent Labs Lab 05/08/15 1340 05/09/15 0404 05/09/15 1020  PHART 7.52* 7.34* 7.42  PCO2ART 27* 45 43  PO2ART 82* 77* 55*   Liver Enzymes  Recent Labs Lab 05/06/15 2038 05/08/15 0454 05/09/15 0104  AST 27 27 22   ALT 28 23 20   ALKPHOS 150* 136* 120  BILITOT 1.3*  1.4* 1.1  ALBUMIN 1.8* 1.9* 1.7*   Cardiac Enzymes No results for input(s): TROPONINI, PROBNP in the last 168 hours. Glucose  Recent Labs Lab 05/09/15 1343 05/09/15 1614 05/09/15 2139 05/10/15 0401 05/10/15 0710  GLUCAP 150* 147* 191* 184* 178*     Recent Results (from the past 240 hour(s))  Culture, blood (routine x 2)     Status: None (Preliminary result)   Collection Time: 05/08/15  6:05 PM  Result Value Ref Range Status   Specimen Description BLOOD LEFT AC  Final   Special Requests   Final    BOTTLES DRAWN AEROBIC AND ANAEROBIC  AER 2 CC ANA 8CC   Culture NO GROWTH 2 DAYS  Final   Report Status PENDING  Incomplete  Culture, blood (routine x 2)     Status: None (Preliminary result)   Collection Time: 05/08/15  6:25 PM  Result Value Ref Range Status   Specimen Description BLOOD RIGHT AC  Final   Special Requests   Final    BOTTLES DRAWN AEROBIC AND ANAEROBIC  AER 1CC ANA [place]   Culture NO GROWTH 2 DAYS  Final   Report Status PENDING  Incomplete  MRSA PCR Screening     Status: None   Collection Time: 05/08/15  7:48 PM  Result Value Ref Range Status   MRSA by PCR NEGATIVE NEGATIVE Final    Comment:        The GeneXpert MRSA Assay (FDA approved for NASAL specimens only), is one component of a comprehensive MRSA colonization surveillance program. It is not intended to diagnose MRSA infection nor to guide or monitor treatment for MRSA infections.      Current facility-administered medications:  .  0.9 %  sodium chloride infusion, , Intravenous, Continuous, Epifanio Lesches, MD, Stopped at 05/09/15 0400 .  acetaminophen (TYLENOL) tablet 650 mg, 650 mg, Oral, Q6H PRN **OR** acetaminophen (TYLENOL) suppository 650 mg, 650 mg, Rectal, Q6H PRN, Lytle Butte, MD, 650 mg at 05/10/15 0529 .  feeding supplement (VITAL HIGH PROTEIN) liquid 1,000 mL, 1,000 mL, Per Tube, Q24H, Flora Lipps, MD .  fentaNYL 2527mg in NS 2564m(1055mml) infusion-PREMIX, 10 mcg/hr,  Intravenous, Continuous, KurFlora LippsD, Last Rate: 25 mL/hr at 05/09/15 2300, 250 mcg/hr at 05/09/15 2300 .  folic acid (FOLVITE) tablet 1 mg, 1 mg, Oral, Daily, CatAldean JewettD, 1 mg at 05/10/15 0943 .  free water 200 mL, 200 mL, Per Tube, 3 times per day, KurFlora LippsD, 200 mL at 05/10/15 0536 .  insulin aspart (novoLOG) injection 0-9 Units, 0-9 Units, Subcutaneous,  TID WC, Aldean Jewett, MD .  ipratropium-albuterol (DUONEB) 0.5-2.5 (3) MG/3ML nebulizer solution 3 mL, 3 mL, Nebulization, Q4H, Flora Lipps, MD, 3 mL at 05/10/15 0750 .  LORazepam (ATIVAN) injection 2-3 mg, 2-3 mg, Intravenous, Q1H PRN, Aldean Jewett, MD, 2 mg at 05/09/15 [phone] .  LORazepam (ATIVAN) tablet 1 mg, 1 mg, Oral, Once, Aldean Jewett, MD, 1 mg at 05/08/15 0754 .  multivitamin with minerals tablet 1 tablet, 1 tablet, Oral, Daily, Aldean Jewett, MD, 1 tablet at 05/10/15 [phone] .  norepinephrine (LEVOPHED) 44m in D5W 2528mpremix infusion, 0-40 mcg/min, Intravenous, Titrated, CaAldean JewettMD, Last Rate: 18.8 mL/hr at 05/10/15 0141, 5 mcg/min at 05/10/15 0141 .  oxyCODONE (Oxy IR/ROXICODONE) immediate release tablet 5 mg, 5 mg, Oral, Q4H PRN, DaLytle ButteMD .  piperacillin-tazobactam (ZOSYN) IVPB 3.375 g, 3.375 g, Intravenous, 3 times per day, CaAldean JewettMD, 3.375 g at 05/10/15 0538 .  potassium chloride 10 mEq in 100 mL IVPB, 10 mEq, Intravenous, Q1 Hr x 4, CaAldean JewettMD, 10 mEq at 05/10/15 0942 .  thiamine (VITAMIN B-1) tablet 100 mg, 100 mg, Oral, Daily, 100 mg at 05/10/15 0942 **OR** thiamine (B-1) injection 100 mg, 100 mg, Intravenous, Daily, CaAldean JewettMD, 100 mg at 05/08/15 1114 .  vancomycin (VANCOCIN) IVPB 1000 mg/200 mL premix, 1,000 mg, Intravenous, Q12H, CaAldean JewettMD, 1,000 mg at 05/10/15 0140  IMAGING    Dg Abd 1 View  05/10/2015   CLINICAL DATA:  Abdominal distension.  Assess nasogastric placement.  EXAM: ABDOMEN - 1 VIEW  COMPARISON:  05/09/2015   FINDINGS: Tube enters the abdomen and passes along the course of the stomach with tip in the region of the antrum. No small or large bowel dilatation. No significant bone finding. Mild vascular calcification.  IMPRESSION: Tube tip in the region of the gastric antrum.  Gas pattern normal.   Electronically Signed   By: MaNelson Chimes.D.   On: 05/10/2015 09:42      Indwelling Urinary Catheter continued, requirement due to   Reason to continue Indwelling Urinary Catheter for strict Intake/Output monitoring for hemodynamic instability   Central Line continued, requirement due to   Reason to continue CeKinder Morgan Energyonitoring of central venous pressure or other hemodynamic parameters   Ventilator continued, requirement due to, resp failure    Ventilator Sedation RASS 0 to -2      INDWELLING DEVICES:: 10/8 ETT#8 10/8 RT IJ CVL  MICRO DATA: MRSA PCR >>neg Flu>>neg Urine  Blood Resp   ANTIMICROBIALS:     ASSESSMENT/PLAN   [age] white male with acute hypoxic resp failure with acute RT sided opacification likely aspiration pneumonia with effusion Metabolic encephlopathy Intubated and placed on full vent support  PULMONARY -Respiratory Failure -continue Full MV support -continue Bronchodilator Therapy -Wean Fio2 and PEEP as tolerated -will perform SAT/SBt when respiratory parameters are met  CARDIOVASCULAR Monitor BP, needs ICU  RENAL Follow chem 7 Follow UO -needs foley  GASTROINTESTINAL -plan for EGD -needs GI recs for management of Portal Vein thrombosis -consider CT abd with contrast to assess for abd mass  HEMATOLOGIC Follow h/h  INFECTIOUS Aspiration pneumonia -empiric abx -BAL cultures pending -check HIV and Hep panel  ENDOCRINE - ICU hypoglycemic\Hyperglycemia protocol   NEUROLOGIC - intubated and sedated - minimal sedation to achieve a RASS goal: -1     I have personally obtained a history, examined the patient, evaluated laboratory and  independently reviewed  imaging results, formulated the assessment and plan and placed orders.  The Patient requires high complexity decision making for assessment and support, frequent evaluation and titration of therapies, application of advanced monitoring technologies and extensive interpretation of multiple databases. Critical Care Time devoted to patient care services described in this note is 40 minutes.   Overall, patient is critically ill, prognosis is guarded. Patient at high risk for cardiac arrest and death.    Corrin Parker, M.D.  [facility]  Medical Director [place] Director [facility] Cardio-Pulmonary Department

## 2015-05-11 ENCOUNTER — Inpatient Hospital Stay: Payer: Self-pay

## 2015-05-11 ENCOUNTER — Encounter
Admission: EM | Disposition: A | Discharge status: Home / Self Care | Payer: Self-pay | Source: Home / Self Care | Attending: Internal Medicine

## 2015-05-11 LAB — GLUCOSE, CAPILLARY
GLUCOSE-CAPILLARY: 137 mg/dL — AB (ref 65–99)
Glucose-Capillary: 125 mg/dL — ABNORMAL HIGH (ref 65–99)
Glucose-Capillary: 132 mg/dL — ABNORMAL HIGH (ref 65–99)

## 2015-05-11 LAB — CBC
HEMATOCRIT: 26.9 % — AB (ref 40.0–52.0)
Hemoglobin: 8.8 g/dL — ABNORMAL LOW (ref 13.0–18.0)
MCH: 27.1 pg (ref 26.0–34.0)
MCHC: 32.7 g/dL (ref 32.0–36.0)
MCV: 82.8 fL (ref 80.0–100.0)
Platelets: 127 10*3/uL — ABNORMAL LOW (ref 150–440)
RBC: 3.25 MIL/uL — ABNORMAL LOW (ref 4.40–5.90)
RDW: 17.3 % — AB (ref 11.5–14.5)
WBC: 10.2 10*3/uL (ref 3.8–10.6)

## 2015-05-11 LAB — VANCOMYCIN, TROUGH: Vancomycin Tr: 12 ug/mL (ref 10–20)

## 2015-05-11 LAB — BASIC METABOLIC PANEL
Anion gap: 5 (ref 5–15)
BUN: 12 mg/dL (ref 6–20)
CHLORIDE: 113 mmol/L — AB (ref 101–111)
CO2: 28 mmol/L (ref 22–32)
CREATININE: 0.58 mg/dL — AB (ref 0.61–1.24)
Calcium: 7.8 mg/dL — ABNORMAL LOW (ref 8.9–10.3)
GFR calc Af Amer: >60 mL/min (ref >60)
GFR calc non Af Amer: >60 mL/min (ref >60)
GLUCOSE: 161 mg/dL — AB (ref 65–99)
Potassium: 3.2 mmol/L — ABNORMAL LOW (ref 3.5–5.1)
Sodium: 146 mmol/L — ABNORMAL HIGH (ref 135–145)

## 2015-05-11 LAB — PHOSPHORUS: Phosphorus: 2.6 mg/dL (ref 2.5–4.6)

## 2015-05-11 SURGERY — EGD (ESOPHAGOGASTRODUODENOSCOPY)
Anesthesia: General

## 2015-05-11 MED ORDER — FENTANYL CITRATE (PF) 100 MCG/2ML IJ SOLN: 12.5000 ug | INTRAMUSCULAR | Status: DC | PRN

## 2015-05-11 MED ORDER — PANTOPRAZOLE SODIUM 40 MG IV SOLR
40.0000 mg | Freq: Two times a day (BID) | INTRAVENOUS | Status: DC
  Administered 2015-05-11 – 2015-05-12 (×2): 40 mg via INTRAVENOUS
  Filled 2015-05-11 (×2): qty 40

## 2015-05-11 MED ORDER — VANCOMYCIN HCL 10 G IV SOLR
1250.0000 mg | Freq: Two times a day (BID) | INTRAVENOUS | Status: DC
  Filled 2015-05-11 (×2): qty 1250

## 2015-05-11 MED ORDER — POTASSIUM CHLORIDE CRYS ER 20 MEQ PO TBCR
20.0000 meq | EXTENDED_RELEASE_TABLET | Freq: Two times a day (BID) | ORAL | Status: DC
  Administered 2015-05-11: 20 meq via ORAL
  Filled 2015-05-11: qty 1

## 2015-05-11 MED ORDER — DEXMEDETOMIDINE HCL IN NACL 400 MCG/100ML IV SOLN
0.2000 ug/kg/h | INTRAVENOUS | Status: DC
  Administered 2015-05-11: 0.5 ug/kg/h via INTRAVENOUS
  Administered 2015-05-11: 0.8 ug/kg/h via INTRAVENOUS
  Administered 2015-05-11: 0.7 ug/kg/h via INTRAVENOUS
  Filled 2015-05-11 (×3): qty 100

## 2015-05-11 MED ORDER — MIDAZOLAM HCL 2 MG/2ML IJ SOLN
INTRAMUSCULAR | Status: AC
  Administered 2015-05-11: 1 mg
  Filled 2015-05-11: qty 2

## 2015-05-11 MED ORDER — LORAZEPAM 2 MG/ML IJ SOLN
1.0000 mg | INTRAMUSCULAR | Status: DC | PRN
  Administered 2015-05-12 – 2015-05-13 (×2): 2 mg via INTRAVENOUS
  Filled 2015-05-11 (×2): qty 1

## 2015-05-11 MED ORDER — PANTOPRAZOLE SODIUM 40 MG IV SOLR
40.0000 mg | Freq: Once | INTRAVENOUS | Status: AC
  Administered 2015-05-11: 40 mg via INTRAVENOUS
  Filled 2015-05-11: qty 40

## 2015-05-11 MED ORDER — IPRATROPIUM-ALBUTEROL 0.5-2.5 (3) MG/3ML IN SOLN: 3.0000 mL | Freq: Four times a day (QID) | RESPIRATORY_TRACT | Status: DC | PRN

## 2015-05-11 MED ORDER — SODIUM CHLORIDE 0.45 % IV SOLN
INTRAVENOUS | Status: DC
  Administered 2015-05-11 – 2015-05-12 (×2): via INTRAVENOUS
  Filled 2015-05-11 (×5): qty 1000

## 2015-05-11 NOTE — Progress Notes (Signed)
Extubated at 0930 by Iona Beard, RRT per Dr. Alva Garnet order. Patient alert and following commands but not talking to nurse or family. No respiratory distress noted. Strong cough. o2 sats 95% on 3L nasal cannula. Mother at bedside. Continuing to monitor.

## 2015-05-11 NOTE — Progress Notes (Signed)
Patient is placed on high fowlers position, cuff deflated suctioned orally and endotracheally and then extubated to 4 lpm O2 [place]. Tolerating well

## 2015-05-11 NOTE — Progress Notes (Signed)
[name]    MR#:  [id number]  DATE OF BIRTH:  12/12/1960  SUBJECTIVE:  CHIEF COMPLAINT:   Chief Complaint  Patient presents with  . Fatigue   Extubated this morning. Copious secretions, weak cough, sister at bedside  REVIEW OF SYSTEMS:   ROS unable to obtain to altered mental status  DRUG ALLERGIES:  No Known Allergies  VITALS:  Blood pressure 111/78, pulse 76, temperature 100.2 F (37.9 C), temperature source Rectal, resp. rate 23, height 5\' 11"  (1.803 m), weight 65.4 kg (144 lb 2.9 oz), SpO2 93 %.  PHYSICAL EXAMINATION:  GENERAL:  54 y.o.-year-old patient lying in the bed sedated, thin, critically ill appearing LUNGS: coarse breath sounds, fair air movement, coughing CARDIOVASCULAR: S1, S2 normal. No murmurs, rubs, or gallops. tachycardic ABDOMEN: Soft, tender to palpation in the upper quadrants, nondistended. Bowel sounds present. No organomegaly or mass.  EXTREMITIES: No pedal edema, cyanosis, or clubbing.  NEUROLOGIC: sedated PSYCHIATRIC: sedated  SKIN: No obvious rash, lesion, or ulcer.    LABORATORY PANEL:   CBC  Recent Labs Lab 05/11/15 0359  WBC 10.2  HGB 8.8*  HCT 26.9*  PLT 127*   ------------------------------------------------------------------------------------------------------------------  Chemistries   Recent Labs Lab 05/09/15 0104 05/10/15 0415 05/11/15 0359  NA 147* 147* 146*  K 3.3* 3.3* 3.2*  CL 117* 114* 113*  CO2 22 26 28   GLUCOSE 153* 188* 161*  BUN 12 16 12   CREATININE 0.53* 0.75 0.58*  CALCIUM 7.5* 7.8* 7.8*  MG  --  2.0  --   AST 22  --   --   ALT 20  --   --   ALKPHOS 120  --   --   BILITOT 1.1  --   --    ------------------------------------------------------------------------------------------------------------------  Cardiac Enzymes No results for input(s): TROPONINI in the last 168  hours. ------------------------------------------------------------------------------------------------------------------  RADIOLOGY:  Dg Chest 1 View  05/11/2015   CLINICAL DATA:  Hypoxia  EXAM: CHEST 1 VIEW  COMPARISON:  May 09, 2015  FINDINGS: Endotracheal tube tip is 2.9 cm above the carina. Central catheter tip is at the cavoatrial junction. Nasogastric tube tip and side port are below the diaphragm. No pneumothorax.  There is been interval clearing of much of the airspace consolidation in the right upper and mid lung regions. Patchy infiltrate remains in these areas. There is a small effusion with patchy infiltrate in the right base, also present on prior study. Left lung is clear. Heart size and pulmonary vascularity are normal. No adenopathy. No bone lesions appreciable.  IMPRESSION: Tube and catheter positions as described without pneumothorax. Significant partial clearing of consolidation from the right mid upper lung regions. Patchy infiltrate remains on the right, much less than on recent prior study. No new opacity is seen. There is a small right effusion. Cardiac silhouette is within normal limits.   Electronically Signed   By: Lowella Grip III M.D.   On: 05/11/2015 07:05   Dg Abd 1 View  05/10/2015   CLINICAL DATA:  Abdominal distension.  Assess nasogastric placement.  EXAM: ABDOMEN - 1 VIEW  COMPARISON:  05/09/2015  FINDINGS: Tube enters the abdomen and passes along the course of the stomach with tip in the region of the antrum. No small or large bowel dilatation. No significant bone finding. Mild vascular calcification.  IMPRESSION: Tube tip in the region of the gastric antrum.  Gas pattern normal.   Electronically Signed  By: Nelson Chimes M.D.   On: 05/10/2015 09:42    EKG:   Orders placed or performed during the hospital encounter of 05/06/15  . ED EKG  . ED EKG  . EKG 12-Lead  . EKG 12-Lead    ASSESSMENT AND PLAN:   #1 symptomatic anemia, likely upper GI in the  setting of heavy alcohol use and use of Goody powders - Has received 3 units packed red blood cells during admission hemoglobin 5.9 >>> 8.8 - Continue Protonix twice a day  - Plan is for EGD when stable  #2 alcohol withdrawal/DT - CT head negative for acute process - Continues on Precedex  #3 acute respiratory failure with hypoxia due to aspiration on 10/7 - Intubated 10/7-10/10 - Blood and BAL cultures pending - Continues to be febrile, continue Zosyn - Chest x-ray much improved today  #4 portal vein thrombus - unable to anticoagulate due to ongoing bleeding - likely due to cirrhosis  #5 possible hepatic mass - will need CT when stable. - Hepatitis and HIV pending  #6 hypokalemia - ccu electrolyte replacement protocol  #7 fever - due to aspriation  CODE STATUS: Full   TOTAL TIME TAKING CARE OF THIS PATIENT: 25 minutes.  Greater than 50% of time spent in care coordination and counseling. Care plan discussed with the patient's sister at the bedside today POSSIBLE D/C IN ? DAYS, DEPENDING ON CLINICAL CONDITION.   Myrtis Ser M.D on 05/11/2015 at 3:40 PM  Between 7am to 6pm - Pager - [phone]  After 6pm go to [email or web address] - password [email or web address] [facility]  [facility]  Office  [phone]  CC: Primary care physician; Marinda Elk, MD

## 2015-05-11 NOTE — Progress Notes (Signed)
PT Cancellation Note  Patient Details Name: Manuel Jimenez MRN: [id number] DOB: [birth year]   Cancelled Treatment:    Reason Eval/Treat Not Completed: Patient not medically ready (pt agitated currently and per nurse not medically ready to be seen today. Will re-attempt at later time/date. )   Milon Score 05/11/2015, 1:15 PM

## 2015-05-11 NOTE — Progress Notes (Signed)
Nutrition Follow-up  DOCUMENTATION CODES:   Severe malnutrition in context of acute illness/injury  INTERVENTION:   Medical Food Supplement: recommend addition of nutritional supplement once diet advanced   NUTRITION DIAGNOSIS:   Inadequate oral intake related to inability to eat as evidenced by NPO status. Continues  GOAL:   Patient will meet greater than or equal to 90% of their needs  MONITOR:    (Energy Intake, Glucose Profile, Electrolyte and Renal Profile, Hepatic Profile, Digestive system)  ASSESSMENT:   Pt admitted with symptomatic anemia per MD note, likely from upper GI bleed. Per MD note, pt with h/o heavy EtOH use, on CIWA, and with abnormal liver function tests.  Pt required intubation over the weekend; s/p extubation this AM. Plan for EGD at some point  Diet Order:    NPO, bedside burke swallow by RN when able  EN: TF discontinued with extubation  Skin:  Reviewed, no issues  Electrolyte and Renal Profile:  Recent Labs Lab 05/06/15 2038  05/08/15 0454 05/09/15 0104 05/10/15 0415 05/11/15 0359  BUN 13  < > 13 12 16 12   CREATININE 0.67  < > 0.64 0.53* 0.75 0.58*  NA 136  < > 144 147* 147* 146*  K 2.4*  < > 3.5 3.3* 3.3* 3.2*  MG 1.8  --  1.9  --  2.0  --   PHOS 2.4*  --   --   --  1.7* 2.6  < > = values in this interval not displayed. Glucose Profile:   Recent Labs  05/10/15 1549 05/10/15 2010 05/11/15 0723  GLUCAP 130* 120* 137*   Nutritional Anemia Profile:  CBC Latest Ref Rng 05/11/2015 05/10/2015 05/10/2015  WBC 3.8 - 10.6 K/uL 10.2 - 18.5(H)  Hemoglobin 13.0 - 18.0 g/dL 8.8(L) 7.9(L) 7.9(L)  Hematocrit 40.0 - 52.0 % 26.9(L) 25.3(L) 25.7(L)  Platelets 150 - 440 K/uL 127(L) - 201    Meds: reviewed  Height:   Ht Readings from Last 1 Encounters:  05/07/15 5\' 11"  (1.803 m)    Weight:   Wt Readings from Last 1 Encounters:  05/11/15 144 lb 2.9 oz (65.4 kg)    Filed Weights   05/09/15 1538 05/10/15 0500 05/11/15 0552  Weight:  142 lb 13.7 oz (64.8 kg) 145 lb 4.5 oz (65.9 kg) 144 lb 2.9 oz (65.4 kg)    BMI:  Body mass index is 20.12 kg/(m^2).  Estimated Nutritional Needs:   Kcal:  8016-5537 kcals (BEE 1506, 1.2 AF, 1.1-1.3 IF)   Protein:  78-98 g (1.2-1.5 g/kg)   Fluid:  1950-2369mL of fluid (25-5mL/kg)  EDUCATION NEEDS:   Education needs no appropriate at this time  [place], [place], LDN [phone] Pager

## 2015-05-11 NOTE — Progress Notes (Signed)
Received 2 units PRBCs yest RASS 0, -1. + F/C Passed SBT. Extubated. Tolerating well  Filed Vitals:   05/11/15 1430 05/11/15 1500 05/11/15 1530 05/11/15 1600  BP: 106/72 121/80 111/78 126/77  Pulse: 81 78 76 72  Temp:   98 F (36.7 C)   TempSrc:   Axillary   Resp: 25 23 23 24   Height:      Weight:      SpO2: 95% 95% 93% 95%   Thin, not cachectic NAD HEENT WNL No JVD No wheezes RRR s M NABS, soft, NT No C/C/E No focal neuro deficits  BMP Latest Ref Rng 05/11/2015 05/10/2015 05/09/2015  Glucose 65 - 99 mg/dL 161(H) 188(H) 153(H)  BUN 6 - 20 mg/dL 12 16 12   Creatinine 0.61 - 1.24 mg/dL 0.58(L) 0.75 0.53(L)  Sodium 135 - 145 mmol/L 146(H) 147(H) 147(H)  Potassium 3.5 - 5.1 mmol/L 3.2(L) 3.3(L) 3.3(L)  Chloride 101 - 111 mmol/L 113(H) 114(H) 117(H)  CO2 22 - 32 mmol/L 28 26 22   Calcium 8.9 - 10.3 mg/dL 7.8(L) 7.8(L) 7.5(L)    CBC Latest Ref Rng 05/11/2015 05/10/2015 05/10/2015  WBC 3.8 - 10.6 K/uL 10.2 - 18.5(H)  Hemoglobin 13.0 - 18.0 g/dL 8.8(L) 7.9(L) 7.9(L)  Hematocrit 40.0 - 52.0 % 26.9(L) 25.3(L) 25.7(L)  Platelets 150 - 440 K/uL 127(L) - 201    CXR: RLL atx vs infiltrate  IMPRESSION: VDRF - Initially intubated for AMS. Extubated 10/10 UGIB - not actively bleeding presently Hypernatremia Hypokalemia EyOH abuse Agitated delirium  PLAN/REC: Extubation 10/10 overseen by me Monitor in ICU post extubation Supplemental O2 as needed to maintain SpO2 > 92% IVFs adjusted Discussed with GI medicine. Elective EGD planned after fully recovers from acute critical illness or if bleeding recurs Dexmedetomidine gtt ordered 10/10 for agitation PT ordered  CCM time:35 mins The above time includes time spent in consultation with patient and/or family members and reviewing care plan on multidisciplinary rounds  Merton Border, MD PCCM service Mobile [phone] Pager [phone]

## 2015-05-11 NOTE — Progress Notes (Signed)
Electrolyte replacement consult  Pharmacy Consult for electrolyte replacement  No Known Allergies  Patient Measurements: Height: 5\' 11"  (180.3 cm) Weight: 145 lb 4.5 oz (65.9 kg) IBW/kg (Calculated) : 75.3  Vital Signs: Temp: 100 F (37.8 C) (10/10 0500) Temp Source: Rectal (10/10 0300) BP: 96/64 mmHg (10/10 0500) Pulse Rate: 92 (10/10 0500) ILabs:  Recent Labs  05/08/15 1124  05/09/15 1343 05/10/15 0415 05/10/15 2151 05/11/15 0359  WBC  --   < > 8.8 18.5*  --  10.2  HGB  --   < > 6.9* 7.9* 7.9* 8.8*  HCT  --   < > 22.2* 25.7* 25.3* 26.9*  PLT  --   < > 126* 201  --  127*  INR 1.48  --   --   --   --   --   < > = values in this interval not displayed.   Recent Labs  05/09/15 0104 05/10/15 0415 05/11/15 0359  NA 147* 147* 146*  K 3.3* 3.3* 3.2*  CL 117* 114* 113*  CO2 22 26 28   GLUCOSE 153* 188* 161*  BUN 12 16 12   CREATININE 0.53* 0.75 0.58*  CALCIUM 7.5* 7.8* 7.8*  MG  --  2.0  --   PHOS  --  1.7* 2.6  PROT 5.4*  --   --   ALBUMIN 1.7*  --   --   AST 22  --   --   ALT 20  --   --   ALKPHOS 120  --   --   BILITOT 1.1  --   --    Estimated Creatinine Clearance: 97.2 mL/min (by C-G formula based on Cr of 0.58).   MAGNESIUM  Date/Time Value Ref Range Status  05/10/2015 04:15 AM 2.0 1.7 - 2.4 mg/dL Final     Recent Labs  05/10/15 1112 05/10/15 1549 05/10/15 2010  GLUCAP 139* 130* 120*    Medical History: Past Medical History  Diagnosis Date  . Hypertension    Assessment: Pharmacy consulted to monitor and replace electrolytes in this 54 year old male. Patient currently intubated and sedated. Potassium, phosphorus low.   Plan:  Potassium replacement ordered by MD (74mEq IV x 4), will supplement phosphorous. Ordered 68mmol potassium phosphate IV X 1. Will recheck with AM labs.   10/10 AM K+ 3.2, phosphate WNL. 40 mEq KCl PO ordered. Recheck BMP in AM.  Sim Boast, PharmD Clinical Pharmacist  05/11/2015 5:36 AM

## 2015-05-11 NOTE — Consult Note (Signed)
Pt with extubation today.  He is asleep now and his breath sounds are good in anterior fields.  Abd seems somewhat tender moans a little in his sleep.  BS present but decreased.  Hgb up after transfusion.  Will do EGD before discharge.  If has any new UGI bleed start Sandostatin ASAP.

## 2015-05-11 NOTE — Care Management Note (Addendum)
Case Management Note  Patient Details  Name: Manuel Jimenez MRN: [id number] Date of Birth: 12/11/1960  Subjective/Objective:  RNCM consult for medication management. Uninsured patient. PMH: HTN and ETOH abuse.  Admitted with symptomatic anemia, fatigue and weakness.  Initial HBG 5.9 with c/o epigastric pain. No obvious blood noted on admission. He has received multiple transfusions snice admission with HGB 8.8 this am.  Developed respiratory distress, was transfer to SDU and emergently intubated, bronchoscopy performed. Found to have aspiration PNA. GI consulted with EGD planned for today. Patient remains intubated and sedated.                 Action/Plan: Will follow progression and assist with home care needs   Expected Discharge Date:  05/09/15               Expected Discharge Plan:     In-House Referral:     Discharge planning Services  CM Consult  Post Acute Care Choice:    Choice offered to:  Parent  DME Arranged:  N/A DME Agency:     HH Arranged:    [place] Agency:     Status of Service:  In process, will continue to follow  Medicare Important Message Given:    Date Medicare IM Given:    Medicare IM give by:    Date Additional Medicare IM Given:    Additional Medicare Important Message give by:     If discussed at [place] of Stay Meetings, dates discussed:    Additional Comments:  Jolly Mango, RN 05/11/2015, 8:39 AM

## 2015-05-11 NOTE — Care Management Note (Signed)
Case Management Note  Patient Details  Name: Sherril Heyward MRN: [id number] Date of Birth: Dec 12, 1960  Subjective/Objective:  Extubated at 0930 this am. EGD pending but dont think it will be today. Once patient walkes up and is more stable will discuss discharge planning with him                  Action/Plan:   Expected Discharge Date:  05/09/15               Expected Discharge Plan:     In-House Referral:     Discharge planning Services  CM Consult  Post Acute Care Choice:    Choice offered to:  Parent  DME Arranged:  N/A DME Agency:     HH Arranged:    [place] Agency:     Status of Service:  In process, will continue to follow  Medicare Important Message Given:    Date Medicare IM Given:    Medicare IM give by:    Date Additional Medicare IM Given:    Additional Medicare Important Message give by:     If discussed at [place] of Stay Meetings, dates discussed:    Additional Comments:  Jolly Mango, RN 05/11/2015, 10:52 AM

## 2015-05-11 NOTE — Progress Notes (Signed)
ANTIBIOTIC CONSULT NOTE - INITIAL  Pharmacy Consult for Vancomycin/Zosyn Indication: Aspsiration PNA  No Known Allergies  Patient Measurements: Height: 5\' 11"  (180.3 cm) Weight: 145 lb 4.5 oz (65.9 kg) IBW/kg (Calculated) : 75.3 Adjusted Body Weight: 61.4 kg  Vital Signs: Temp: 100 F (37.8 C) (10/10 0500) Temp Source: Rectal (10/10 0300) BP: 96/64 mmHg (10/10 0500) Pulse Rate: 92 (10/10 0500) Intake/Output from previous day: 10/09 0701 - 10/10 0700 In: 3641.7 [I.V.:1780.4; Blood:600; NG/GT:761.3; IV [id number]:782] Out: 1250 [Urine:1250] Intake/Output from this shift: Total I/O In: 3257.9 [I.V.:1396.6; Blood:600; NG/GT:761.3; IV Piggyback:500] Out: 500 [Urine:500]  Labs:  Recent Labs  05/09/15 0104 05/09/15 1343 05/10/15 0415 05/10/15 2151 05/11/15 0359  WBC 8.1 8.8 18.5*  --  10.2  HGB 6.9* 6.9* 7.9* 7.9* 8.8*  PLT 130* 126* 201  --  127*  CREATININE 0.53*  --  0.75  --  0.58*   Estimated Creatinine Clearance: 97.2 mL/min (by C-G formula based on Cr of 0.58).  Recent Labs  05/11/15 0123  [place] 12     Microbiology: Recent Results (from the past 720 hour(s))  Culture, blood (routine x 2)     Status: None (Preliminary result)   Collection Time: 05/08/15  6:05 PM  Result Value Ref Range Status   Specimen Description BLOOD LEFT AC  Final   Special Requests   Final    BOTTLES DRAWN AEROBIC AND ANAEROBIC  AER 2 CC ANA 8CC   Culture NO GROWTH 2 DAYS  Final   Report Status PENDING  Incomplete  Culture, blood (routine x 2)     Status: None (Preliminary result)   Collection Time: 05/08/15  6:25 PM  Result Value Ref Range Status   Specimen Description BLOOD RIGHT AC  Final   Special Requests   Final    BOTTLES DRAWN AEROBIC AND ANAEROBIC  AER 1CC ANA [place]   Culture NO GROWTH 2 DAYS  Final   Report Status PENDING  Incomplete  MRSA PCR Screening     Status: None   Collection Time: 05/08/15  7:48 PM  Result Value Ref Range Status   MRSA by PCR NEGATIVE  NEGATIVE Final    Comment:        The GeneXpert MRSA Assay (FDA approved for NASAL specimens only), is one component of a comprehensive MRSA colonization surveillance program. It is not intended to diagnose MRSA infection nor to guide or monitor treatment for MRSA infections.   Culture, bal-quantitative     Status: None (Preliminary result)   Collection Time: 05/09/15  9:20 AM  Result Value Ref Range Status   Specimen Description BRONCHIAL ALVEOLAR LAVAGE  Final   Special Requests NONE  Final   Gram Stain PENDING  Incomplete   Culture   Final    LIGHT GROWTH UNIDENTIFIED ORGANISM IDENTIFICATION TO FOLLOW    Report Status PENDING  Incomplete    Medical History: Past Medical History  Diagnosis Date  . Hypertension     Medications:  Prescriptions prior to admission  Medication Sig Dispense Refill Last Dose  . Aspirin-Acetaminophen-Caffeine (GOODY HEADACHE PO) Take 1 packet by mouth as needed.   Past Week at Unknown time   Assessment: 54 y/o M with symptomatic anemia and alcohol withdrawal started on broad-spectrum antibiotics due to fever and SOB.   Goal of Therapy:  Vancomycin trough level 15-20 mcg/ml  Plan:  Continue Zosyn 3.375 gm IV Q8H EI ordered to start on 10/07.  Will continue vancomycin 1000 mg iv q 12 hours with a  trough scheduled with the 5th dose. Will f/u culture results and renal function.   10/10 vanc trough 12. Changed to 1250 mg q 12 hours. Level before 4th new dose.   Rane Dumm S 05/11/2015,5:32 AM

## 2015-05-11 NOTE — Progress Notes (Signed)
Patient responded well to ativan per CIWA order but only for a short period of time and is now agitated trying to get out of bed, delirious and taking monitoring devices off, not following commands. RN made Dr. Alva Garnet aware of all mentioned above and MD stated "i will order precedex."

## 2015-05-11 NOTE — Progress Notes (Signed)
ANTIBIOTIC CONSULT NOTE - FOLLOW UP  Pharmacy Consult for zosyn Indication: Aspiration Pneumonia  No Known Allergies  Patient Measurements: Height: 5\' 11"  (180.3 cm) Weight: 144 lb 2.9 oz (65.4 kg) IBW/kg (Calculated) : 75.3  Vital Signs: Temp: 99.9 F (37.7 C) (10/10 1000) Temp Source: Rectal (10/10 0300) BP: 94/61 mmHg (10/10 1000) Pulse Rate: 111 (10/10 1000) Intake/Output from previous day: 10/09 0701 - 10/10 0700 In: 5230.4 [I.V.:3040.4; Blood:600; NG/GT:1040; IV Piggyback:550] Out: 6301 [Urine:1650] Intake/Output from this shift: Total I/O In: 420 [I.V.:330; NG/GT:90] Out: -   Labs:  Recent Labs  05/09/15 0104 05/09/15 1343 05/10/15 0415 05/10/15 2151 05/11/15 0359  WBC 8.1 8.8 18.5*  --  10.2  HGB 6.9* 6.9* 7.9* 7.9* 8.8*  PLT 130* 126* 201  --  127*  CREATININE 0.53*  --  0.75  --  0.58*   Estimated Creatinine Clearance: 96.5 mL/min (by C-G formula based on Cr of 0.58).  Recent Labs  05/11/15 0123  [place] 12     Microbiology: Recent Results (from the past 720 hour(s))  Culture, blood (routine x 2)     Status: None (Preliminary result)   Collection Time: 05/08/15  6:05 PM  Result Value Ref Range Status   Specimen Description BLOOD LEFT AC  Final   Special Requests   Final    BOTTLES DRAWN AEROBIC AND ANAEROBIC  AER 2 CC ANA 8CC   Culture NO GROWTH 3 DAYS  Final   Report Status PENDING  Incomplete  Culture, blood (routine x 2)     Status: None (Preliminary result)   Collection Time: 05/08/15  6:25 PM  Result Value Ref Range Status   Specimen Description BLOOD RIGHT AC  Final   Special Requests   Final    BOTTLES DRAWN AEROBIC AND ANAEROBIC  AER 1CC ANA [place]   Culture NO GROWTH 3 DAYS  Final   Report Status PENDING  Incomplete  MRSA PCR Screening     Status: None   Collection Time: 05/08/15  7:48 PM  Result Value Ref Range Status   MRSA by PCR NEGATIVE NEGATIVE Final    Comment:        The GeneXpert MRSA Assay (FDA approved for NASAL  specimens only), is one component of a comprehensive MRSA colonization surveillance program. It is not intended to diagnose MRSA infection nor to guide or monitor treatment for MRSA infections.   Culture, bal-quantitative     Status: None (Preliminary result)   Collection Time: 05/09/15  9:20 AM  Result Value Ref Range Status   Specimen Description BRONCHIAL ALVEOLAR LAVAGE  Final   Special Requests NONE  Final   Gram Stain PENDING  Incomplete   Culture   Final    LIGHT GROWTH UNIDENTIFIED ORGANISM IDENTIFICATION TO FOLLOW    Report Status PENDING  Incomplete    Anti-infectives    Start     Dose/Rate Route Frequency Ordered Stop   05/11/15 1300  vancomycin (VANCOCIN) 1,250 mg in sodium chloride 0.9 % 250 mL IVPB  Status:  Discontinued     1,250 mg 166.7 mL/hr over 90 Minutes Intravenous Every 12 hours 05/11/15 0532 05/11/15 0916   05/09/15 0130  vancomycin (VANCOCIN) IVPB 1000 mg/200 mL premix  Status:  Discontinued     1,000 mg 200 mL/hr over 60 Minutes Intravenous Every 12 hours 05/08/15 1917 05/11/15 0532   05/08/15 1700  piperacillin-tazobactam (ZOSYN) IVPB 3.375 g     3.375 g 12.5 mL/hr over 240 Minutes Intravenous 3 times per  day 05/08/15 1649     05/08/15 1700  vancomycin (VANCOCIN) IVPB 1000 mg/200 mL premix     1,000 mg 200 mL/hr over 60 Minutes Intravenous STAT 05/08/15 1650 05/08/15 1938      Assessment: 54 yo male admitted to ICU for symptomatic anemia and alcohol withdrawal with DTs. Pharmacy has been consulted for Zosyn dosing and monitoring for aspiration PNA. CXR  Shows small Rt pleural effusion. WBC 10, temp 100, Scr 0.58, CrCl 97.. Vancomycin was discontinued today this morning .   Plan:  Will continue patient on Zosyn regimen of 3.375 q8 hours. Pharmacy will continue to monitor patients renal function and labs and make adjustments as needed.   Nancy Fetter, PharmD Pharmacy Resident

## 2015-05-11 NOTE — Progress Notes (Signed)
RN spoke with Dr. Volanda Napoleon and asked MD about needing sitter at bedside for patient's safety for withdrawal symptoms. Dr. Volanda Napoleon gave order for PRN sitter.

## 2015-05-11 NOTE — Progress Notes (Signed)
Patient agitated trying to get out of bed therefore PRN sitter at bedside now. Safety mitts on.  Patient alert to self only.  Speech is clearer than earlier in shift. NSR. Afebrile. No respiratory distress. o2 sats mid to upper 90's on RA. Congested non-productive cough. Family visited throughout shift.

## 2015-05-12 ENCOUNTER — Inpatient Hospital Stay: Payer: Self-pay

## 2015-05-12 LAB — CBC
HEMATOCRIT: 27.6 % — AB (ref 40.0–52.0)
HEMOGLOBIN: 8.9 g/dL — AB (ref 13.0–18.0)
MCH: 26.5 pg (ref 26.0–34.0)
MCHC: 32.2 g/dL (ref 32.0–36.0)
MCV: 82.3 fL (ref 80.0–100.0)
Platelets: 121 10*3/uL — ABNORMAL LOW (ref 150–440)
RBC: 3.35 MIL/uL — AB (ref 4.40–5.90)
RDW: 17.6 % — ABNORMAL HIGH (ref 11.5–14.5)
WBC: 8.5 10*3/uL (ref 3.8–10.6)

## 2015-05-12 LAB — TYPE AND SCREEN
ABO/RH(D): A POS
Antibody Screen: NEGATIVE
UNIT DIVISION: 0
UNIT DIVISION: 0
UNIT DIVISION: 0
Unit division: 0

## 2015-05-12 LAB — BASIC METABOLIC PANEL
ANION GAP: 6 (ref 5–15)
BUN: 16 mg/dL (ref 6–20)
CHLORIDE: 114 mmol/L — AB (ref 101–111)
CO2: 25 mmol/L (ref 22–32)
Calcium: 7.8 mg/dL — ABNORMAL LOW (ref 8.9–10.3)
Creatinine, Ser: 0.6 mg/dL — ABNORMAL LOW (ref 0.61–1.24)
GFR calc non Af Amer: >60 mL/min (ref >60)
Glucose, Bld: 112 mg/dL — ABNORMAL HIGH (ref 65–99)
POTASSIUM: 3.8 mmol/L (ref 3.5–5.1)
Sodium: 145 mmol/L (ref 135–145)

## 2015-05-12 LAB — HEPATITIS PANEL, ACUTE
HCV Ab: <0.1 s/co ratio (ref 0.0–0.9)
HEP A IGM: NEGATIVE
Hep B C IgM: NEGATIVE
Hepatitis B Surface Ag: NEGATIVE

## 2015-05-12 LAB — GLUCOSE, CAPILLARY
GLUCOSE-CAPILLARY: 98 mg/dL (ref 65–99)
GLUCOSE-CAPILLARY: 97 mg/dL (ref 65–99)
Glucose-Capillary: 109 mg/dL — ABNORMAL HIGH (ref 65–99)
Glucose-Capillary: 104 mg/dL — ABNORMAL HIGH (ref 65–99)

## 2015-05-12 LAB — HIV ANTIBODY (ROUTINE TESTING W REFLEX): HIV Screen 4th Generation wRfx: NONREACTIVE

## 2015-05-12 MED ORDER — AMOXICILLIN-POT CLAVULANATE 875-125 MG PO TABS
1.0000 | ORAL_TABLET | Freq: Two times a day (BID) | ORAL | Status: DC
  Administered 2015-05-12 – 2015-05-13 (×3): 1 via ORAL
  Filled 2015-05-12 (×4): qty 1

## 2015-05-12 MED ORDER — PANTOPRAZOLE SODIUM 40 MG PO TBEC
40.0000 mg | DELAYED_RELEASE_TABLET | Freq: Every day | ORAL | Status: DC
  Administered 2015-05-13 – 2015-05-14 (×2): 40 mg via ORAL
  Filled 2015-05-12 (×2): qty 1

## 2015-05-12 MED ORDER — IPRATROPIUM-ALBUTEROL 0.5-2.5 (3) MG/3ML IN SOLN
3.0000 mL | RESPIRATORY_TRACT | Status: DC | PRN
  Filled 2015-05-12: qty 3

## 2015-05-12 NOTE — Progress Notes (Signed)
ANTIBIOTIC CONSULT NOTE - FOLLOW UP  Pharmacy Consult for Zosyn Indication: Aspiration Pneumonia  No Known Allergies  Patient Measurements: Height: 5\' 11"  (180.3 cm) Weight: 147 lb 14.9 oz (67.1 kg) IBW/kg (Calculated) : 75.3  Vital Signs: Temp: 96.8 F (36 C) (10/11 0700) Temp Source: Axillary (10/11 0700) BP: 121/61 mmHg (10/11 0800) Pulse Rate: 68 (10/11 0600) Intake/Output from previous day: 10/10 0701 - 10/11 0700 In: 2623 [I.V.:2383; NG/GT:90; IV Piggyback:150] Out: 1551 [Urine:1550; Stool:1] Intake/Output from this shift: Total I/O In: 380.5 [P.O.:60; I.V.:320.5] Out: 100 [Urine:100]  Labs:  Recent Labs  05/10/15 0415 05/10/15 2151 05/11/15 0359 05/12/15 0509  WBC 18.5*  --  10.2 8.5  HGB 7.9* 7.9* 8.8* 8.9*  PLT 201  --  127* 121*  CREATININE 0.75  --  0.58* 0.60*   Estimated Creatinine Clearance: 99 mL/min (by C-G formula based on Cr of 0.6).  Recent Labs  05/11/15 0123  [place] 12     Microbiology: Recent Results (from the past 720 hour(s))  Culture, blood (routine x 2)     Status: None (Preliminary result)   Collection Time: 05/08/15  6:05 PM  Result Value Ref Range Status   Specimen Description BLOOD LEFT AC  Final   Special Requests   Final    BOTTLES DRAWN AEROBIC AND ANAEROBIC  AER 2 CC ANA 8CC   Culture NO GROWTH 3 DAYS  Final   Report Status PENDING  Incomplete  Culture, blood (routine x 2)     Status: None (Preliminary result)   Collection Time: 05/08/15  6:25 PM  Result Value Ref Range Status   Specimen Description BLOOD RIGHT AC  Final   Special Requests   Final    BOTTLES DRAWN AEROBIC AND ANAEROBIC  AER 1CC ANA [place]   Culture NO GROWTH 3 DAYS  Final   Report Status PENDING  Incomplete  MRSA PCR Screening     Status: None   Collection Time: 05/08/15  7:48 PM  Result Value Ref Range Status   MRSA by PCR NEGATIVE NEGATIVE Final    Comment:        The GeneXpert MRSA Assay (FDA approved for NASAL specimens only), is one  component of a comprehensive MRSA colonization surveillance program. It is not intended to diagnose MRSA infection nor to guide or monitor treatment for MRSA infections.   Culture, bal-quantitative     Status: None (Preliminary result)   Collection Time: 05/09/15  9:20 AM  Result Value Ref Range Status   Specimen Description BRONCHIAL ALVEOLAR LAVAGE  Final   Special Requests NONE  Final   Gram Stain   Final    MODERATE WBC SEEN FEW GRAM POSITIVE COCCI RARE GRAM NEGATIVE RODS    Culture   Final    LIGHT GROWTH HAEMOPHILUS INFLUENZAE RARE STAPHYLOCOCCUS AUREUS RARE GRAM NEGATIVE RODS IDENTIFICATION AND SUSCEPTIBILITIES TO FOLLOW    Report Status PENDING  Incomplete    Anti-infectives    Start     Dose/Rate Route Frequency Ordered Stop   05/11/15 1300  vancomycin (VANCOCIN) 1,250 mg in sodium chloride 0.9 % 250 mL IVPB  Status:  Discontinued     1,250 mg 166.7 mL/hr over 90 Minutes Intravenous Every 12 hours 05/11/15 0532 05/11/15 0916   05/09/15 0130  vancomycin (VANCOCIN) IVPB 1000 mg/200 mL premix  Status:  Discontinued     1,000 mg 200 mL/hr over 60 Minutes Intravenous Every 12 hours 05/08/15 1917 05/11/15 0532   05/08/15 1700  piperacillin-tazobactam (ZOSYN) IVPB 3.375  g     3.375 g 12.5 mL/hr over 240 Minutes Intravenous 3 times per day 05/08/15 1649     05/08/15 1700  vancomycin (VANCOCIN) IVPB 1000 mg/200 mL premix     1,000 mg 200 mL/hr over 60 Minutes Intravenous STAT 05/08/15 1650 05/08/15 1938      Assessment: 54 yo male admitted to ICU for symptomatic anemia and alcohol withdrawal with DTs. Pharmacy has been consulted for Zosyn dosing and monitoring for aspiration PNA. Patient also received vancomycin X4 days (Dcd 10/10).  CXR 10/11 Shows RLL infiltrate partial clearing. WBC 8.5 (down from 10), Tmax last 24 hours 98.6, Scr 0.6, and CrCl 99.  Plan:  Will continue patient on Zosyn regimen of 3.375 q8 hours. Pharmacy will continue to monitor patients renal  function and labs and make adjustments as needed.   Nancy Fetter, PharmD Pharmacy Resident

## 2015-05-12 NOTE — Progress Notes (Addendum)
No new complaints No Distress Mildly confused  Filed Vitals:   05/12/15 0800 05/12/15 0900 05/12/15 1000 05/12/15 1200  BP: 121/61 102/68 135/99   Pulse:  69 97   Temp:    98.5 F (36.9 C)  TempSrc:   Axillary Axillary  Resp: 29 27 26    Height:      Weight:      SpO2:  97% 97%    NAD HEENT WNL No JVD No wheezes RRR s M NABS, soft, NT No C/C/E No focal neuro deficits  BMP Latest Ref Rng 05/12/2015 05/11/2015 05/10/2015  Glucose 65 - 99 mg/dL 112(H) 161(H) 188(H)  BUN 6 - 20 mg/dL 16 12 16   Creatinine 0.61 - 1.24 mg/dL 0.60(L) 0.58(L) 0.75  Sodium 135 - 145 mmol/L 145 146(H) 147(H)  Potassium 3.5 - 5.1 mmol/L 3.8 3.2(L) 3.3(L)  Chloride 101 - 111 mmol/L 114(H) 113(H) 114(H)  CO2 22 - 32 mmol/L 25 28 26   Calcium 8.9 - 10.3 mg/dL 7.8(L) 7.8(L) 7.8(L)    CBC Latest Ref Rng 05/12/2015 05/11/2015 05/10/2015  WBC 3.8 - 10.6 K/uL 8.5 10.2 -  Hemoglobin 13.0 - 18.0 g/dL 8.9(L) 8.8(L) 7.9(L)  Hematocrit 40.0 - 52.0 % 27.6(L) 26.9(L) 25.3(L)  Platelets 150 - 440 K/uL 121(L) 127(L) -    CXR: [place] RLL atx vs infiltrate  IMPRESSION: VDRF, resolved Suspect RLL asp PNA UGIB - not actively bleeding presently Hypernatremia Hypokalemia, resolved EtOH abuse Delirium - improving  PLAN/REC: Cont supplemental O2 as needed to maintain SpO2 > 92% DC pip-tazo Augmentin X 3 more days DC Foley cath DC CVL Transfer to med-surg with sitter Elective EGD planned after fully recovers from acute critical illness or if bleeding recurs Cont PT Discussed with Dr Volanda Napoleon PCCM will sign off. Please call if we can be of further assistance  Merton Border, MD PCCM service Mobile [phone] Pager [phone]

## 2015-05-12 NOTE — Progress Notes (Signed)
[place] at [place] NAME: Manuel Jimenez    MR#:  [id number]  DATE OF BIRTH:  08-01-1961  SUBJECTIVE:  CHIEF COMPLAINT:   Chief Complaint  Patient presents with  . Fatigue   Doing well this morning. Alert, oriented, breathing well.  REVIEW OF SYSTEMS:   Review of Systems  Constitutional: Negative for fever.  Respiratory: Positive for cough and shortness of breath. Negative for sputum production and wheezing.   Cardiovascular: Negative for chest pain and palpitations.  Gastrointestinal: Negative for nausea, vomiting and abdominal pain.  Genitourinary: Negative for dysuria.    DRUG ALLERGIES:  No Known Allergies  VITALS:  Blood pressure 98/66, pulse 93, temperature 98.5 F (36.9 C), temperature source Axillary, resp. rate 23, height 5\' 11"  (1.803 m), weight 67.1 kg (147 lb 14.9 oz), SpO2 94 %.  PHYSICAL EXAMINATION:  GENERAL:  54 y.o.-year-old patient lying in the bed no distress, thin LUNGS: good air movement, no wheezes, rhonchi or rales CARDIOVASCULAR: S1, S2 normal. No murmurs, rubs, or gallops. tachycardic ABDOMEN: Soft, tender to palpation in the upper quadrants, nondistended. Bowel sounds present. No organomegaly or mass.  EXTREMITIES: No pedal edema, cyanosis, or clubbing.  NEUROLOGIC: CN 2-12 intact, strength 5/5, sensation in tact PSYCHIATRIC: AOx3, calm SKIN: No obvious rash, lesion, or ulcer.    LABORATORY PANEL:   CBC  Recent Labs Lab 05/12/15 0509  WBC 8.5  HGB 8.9*  HCT 27.6*  PLT 121*   ------------------------------------------------------------------------------------------------------------------  Chemistries   Recent Labs Lab 05/09/15 0104 05/10/15 0415  05/12/15 0509  NA 147* 147*  < > 145  K 3.3* 3.3*  < > 3.8  CL 117* 114*  < > 114*  CO2 22 26  < > 25  GLUCOSE 153* 188*  < > 112*  BUN 12 16  < > 16  CREATININE 0.53* 0.75  < > 0.60*  CALCIUM 7.5* 7.8*  < > 7.8*  MG  --  2.0  --    --   AST 22  --   --   --   ALT 20  --   --   --   ALKPHOS 120  --   --   --   BILITOT 1.1  --   --   --   < > = values in this interval not displayed. ------------------------------------------------------------------------------------------------------------------  Cardiac Enzymes No results for input(s): TROPONINI in the last 168 hours. ------------------------------------------------------------------------------------------------------------------  RADIOLOGY:  Dg Chest 1 View  05/11/2015   CLINICAL DATA:  Hypoxia  EXAM: CHEST 1 VIEW  COMPARISON:  May 09, 2015  FINDINGS: Endotracheal tube tip is 2.9 cm above the carina. Central catheter tip is at the cavoatrial junction. Nasogastric tube tip and side port are below the diaphragm. No pneumothorax.  There is been interval clearing of much of the airspace consolidation in the right upper and mid lung regions. Patchy infiltrate remains in these areas. There is a small effusion with patchy infiltrate in the right base, also present on prior study. Left lung is clear. Heart size and pulmonary vascularity are normal. No adenopathy. No bone lesions appreciable.  IMPRESSION: Tube and catheter positions as described without pneumothorax. Significant partial clearing of consolidation from the right mid upper lung regions. Patchy infiltrate remains on the right, much less than on recent prior study. No new opacity is seen. There is a small right effusion. Cardiac silhouette is within normal limits.   Electronically Signed   By: Lowella Grip  III M.D.   On: 05/11/2015 07:05   Dg Chest Port 1 View  05/12/2015   CLINICAL DATA:  Respiratory failure.  EXAM: PORTABLE CHEST 1 VIEW  COMPARISON:  05/11/2015.  FINDINGS: Interim extubation and removal of NG tube. Right IJ line in stable position. Continued partial clearing of right lower lobe infiltrate. Mild left base subsegmental atelectasis. No pleural effusion or pneumothorax.  IMPRESSION: 1. Interim  extubation and removal of NG tube. Right IJ line stable position.  2. Continued partial clearing of right lower lobe infiltrate . Mild left base subsegmental atelectasis.   Electronically Signed   By: Marcello Moores  Register   On: 05/12/2015 07:53    EKG:   Orders placed or performed during the hospital encounter of 05/06/15  . ED EKG  . ED EKG  . EKG 12-Lead  . EKG 12-Lead    ASSESSMENT AND PLAN:   #1 symptomatic anemia, likely upper GI in the setting of heavy alcohol use and use of Goody powders - Has received 3 units packed red blood cells during admission hemoglobin 5.9 >>> 8.9 - Continue Protonix twice a day  - Plan is for EGD when stable  #2 alcohol withdrawal/DT - off precidex, continue CIWA protocol - much more clear today  #3 acute respiratory failure with hypoxia due to aspiration on 10/7 - Intubated 10/7-10/10 - BAL cultures light growth MRSA, transition to augmentin  #4 portal vein thrombus - unable to anticoagulate due to ongoing bleeding - likely due to cirrhosis  #5 possible hepatic mass - will need CT when stable. - Hepatitis and HIV negative  #6 hypokalemia - ccu electrolyte replacement protocol  PT eval ordered  CODE STATUS: Full   TOTAL TIME TAKING CARE OF THIS PATIENT: 25 minutes.  Greater than 50% of time spent in care coordination and counseling. Care plan discussed with the patient's sister at the bedside today POSSIBLE D/C IN 2-3 DAYS, DEPENDING ON CLINICAL CONDITION.   Myrtis Ser M.D on 05/12/2015 at 3:33 PM  Between 7am to 6pm - Pager - [phone]  After 6pm go to [email or web address] - password [email or web address] [facility]  [facility]  Office  [phone]  CC: Primary care physician; Marinda Elk, MD

## 2015-05-12 NOTE — Consult Note (Signed)
Pt extubated and on floor bed.  His breathing is marginal with poor breath sounds in posterior fields but somewhat better anterior fields.  Abd non tender, non distended,  bowel sounds present.  Some tachyptnea. It will be a few days before I would do and EGD, although one is needed due to his presenting anemia of 5.  Will repeat CXR tomorrow to see if any improvement.  Holding on to blood transfusions without signif fall in blood count after transfusion.

## 2015-05-12 NOTE — Evaluation (Signed)
Physical Therapy Evaluation Patient Details Name: Manuel Jimenez MRN: [id number] DOB: 1960-08-19 Today's Date: 05/12/2015   History of Present Illness  Manuel Jimenez is a 54 y.o. male with a known history of essential hypertension, alcohol abuse presenting with generalized fatigue/weakness. He describes approximately 3-4 week total duration of progressive fatigue and generalized weakness. He also complains of associated epigastric pain, "pain" when asked to further qualify, nonradiating, intensity 4/10 no worsening or relieving factors. Also describes having dyspnea on exertion with his fatigue. Given progressive symptoms decided presents to Hospital further workup and evaluation found to be anemic with hemoglobin of 5.9. Emergency department course: Transfusion 2 unit packed red blood cells initiated. Pt has since been intubated (10/8) and extubated (10/10) with another 2 units of blood transfused.   Clinical Impression  Pt A&O to person only and still appears to have some mild lingering cognitive deficits and tends to mumble when he speaks which makes it hard for him to understand. Pt would look to family member present for answering of questions while taking hx. Pt is independent with bed mobility and modified independent with transfers. Pt demonstrates good core and functional LE strength with mobility. Pt was able to ambulate x 150 ft with min assist to prevent pt from drifting toward the R with gait. Pt tends to keep a wide BOS with standing activity which suggests some underlying instability. Pt's vitals were monitored throughout the duration of the evaluation; all of which stayed within healthy levels. Pt will potentially benefit from ambulation with a walker or other LRAD in order to increase stability during gait; will trial next treatment session. Pt will continue to benefit from skilled acute PT services in order to increase capacity for functional mobility and increase stability/balance.     Follow Up Recommendations Home health PT (with 24 hour supervision provided by family/friends if available. If this can not be arranged pt will benefit from SNF upon d/c. )    Equipment Recommendations       Recommendations for Other Services       Precautions / Restrictions Precautions Precautions: Fall Restrictions Weight Bearing Restrictions: No      Mobility  Bed Mobility Overal bed mobility: Independent             General bed mobility comments: pt able to transfer supine <>sitting on EOB independently; displays adequate core strength for transfers  Transfers Overall transfer level: Modified independent Equipment used: None             General transfer comment: CGA for sit<>stand transfer without used of asssistive device. Pt tended to used a wide BOS for increased stability during transfer  Ambulation/Gait Ambulation/Gait assistance: Min assist Ambulation Distance (Feet): 150 Feet Assistive device: None Gait Pattern/deviations: Step-through pattern;Decreased step length - right;Decreased step length - left;Decreased stride length;Decreased dorsiflexion - right;Decreased dorsiflexion - left;Wide base of support;Drifts right/left   Gait velocity interpretation: Below normal speed for age/gender General Gait Details: Pt is unsteady on feet compared to baseline; keeps a wider BOS than normal in order to help with balance. Min assist required to keep patient from drifting toward the R while walking.   Stairs            Wheelchair Mobility    Modified Rankin (Stroke Patients Only)       Balance Overall balance assessment: Modified Independent (wide BOS kept for balance; increased postural sway noted with static/dynamic acitivity)  Pertinent Vitals/Pain Pain Assessment: No/denies pain    Home Living Family/patient expects to be discharged to:: Private residence Living Arrangements:  Spouse/significant other Available Help at Discharge: Family Type of Home: House Home Access: Stairs to enter Entrance Stairs-Rails: Right Entrance Stairs-Number of Steps: 4-5 Home Layout: One level   Additional Comments: No equipment available; pt was independent prior to admission    Prior Function Level of Independence: Independent         Comments: independent with all ADLs     Hand Dominance        Extremity/Trunk Assessment   Upper Extremity Assessment: Overall WFL for tasks assessed           Lower Extremity Assessment: Overall WFL for tasks assessed (Gross LE strength at least 4-/5)         Communication   Communication: Expressive difficulties (pt tends to mumble and is slightly confused still)  Cognition Arousal/Alertness: Awake/alert Behavior During Therapy: WFL for tasks assessed/performed Overall Cognitive Status: Impaired/Different from baseline Area of Impairment: Orientation Orientation Level: Disoriented to;Place;Time;Situation   Memory: Decreased short-term memory         General Comments: Pt is still mildly confused which is differnent from his baseline level of cognition    General Comments      Exercises Other Exercises Other Exercises: forward/backward stepping x 5 bilat (demonstration and return demonstration method); current level of confusion affected pt's ability to follow exercise commands consistently      Assessment/Plan    PT Assessment Patient needs continued PT services  PT Diagnosis Difficulty walking;Abnormality of gait;Generalized weakness;Altered mental status   PT Problem List Decreased activity tolerance;Decreased balance;Decreased mobility;Decreased cognition  PT Treatment Interventions DME instruction;Gait training;Stair training;Functional mobility training;Therapeutic activities;Therapeutic exercise;Balance training;Patient/family education   PT Goals (Current goals can be found in the Care Plan section)  Acute Rehab PT Goals Patient Stated Goal: to go home PT Goal Formulation: With patient Time For Goal Achievement: 05/26/15 Potential to Achieve Goals: Good    Frequency Min 2X/week   Barriers to discharge        Co-evaluation               End of Session Equipment Utilized During Treatment: Gait belt Activity Tolerance: Patient tolerated treatment well Patient left: in bed;with family/visitor present;with call bell/phone within reach           Time: 1505-6979 PT Time Calculation (min) (ACUTE ONLY): 32 min   Charges:         PT G CodesMilon Score 05/12/2015, 10:48 AM

## 2015-05-12 NOTE — Care Management Note (Addendum)
Case Management Note  Patient Details  Name: Keonte Daubenspeck MRN: [id number] Date of Birth: 09-12-1960  Subjective/Objective:   Patient confused and needs a sitter.  PT recommending 24 hour care and if not available SNF at DC. Spoke with [employer]. This is patient's live in girlfriend. She states she works Monday through Friday from 8-5  But has taken a leave to provide 24 hour care for patient. Plan is transfer to the floor with a sitter.                Action/Plan:   Expected Discharge Date:               Expected Discharge Plan:     In-House Referral:     Discharge planning Services  CM Consult  Post Acute Care Choice:    Choice offered to:    DME Arranged:  N/A DME Agency:     HH Arranged:    HH Agency:     Status of Service:  In process, will continue to follow  Medicare Important Message Given:    Date Medicare IM Given:    Medicare IM give by:    Date Additional Medicare IM Given:    Additional Medicare Important Message give by:     If discussed at [place] of Stay Meetings, dates discussed:    Additional Comments:  Jolly Mango, RN 05/12/2015, 2:24 PM

## 2015-05-13 ENCOUNTER — Inpatient Hospital Stay: Payer: Self-pay

## 2015-05-13 LAB — CULTURE, BLOOD (ROUTINE X 2)
CULTURE: NO GROWTH
CULTURE: NO GROWTH

## 2015-05-13 LAB — CBC
HCT: 28.3 % — ABNORMAL LOW (ref 40.0–52.0)
HEMOGLOBIN: 9.3 g/dL — AB (ref 13.0–18.0)
MCH: 27.1 pg (ref 26.0–34.0)
MCHC: 33.1 g/dL (ref 32.0–36.0)
MCV: 81.9 fL (ref 80.0–100.0)
Platelets: 140 10*3/uL — ABNORMAL LOW (ref 150–440)
RBC: 3.45 MIL/uL — AB (ref 4.40–5.90)
RDW: 18.7 % — ABNORMAL HIGH (ref 11.5–14.5)
WBC: 10 10*3/uL (ref 3.8–10.6)

## 2015-05-13 LAB — PHOSPHORUS: PHOSPHORUS: 2.3 mg/dL — AB (ref 2.5–4.6)

## 2015-05-13 LAB — BASIC METABOLIC PANEL
ANION GAP: 9 (ref 5–15)
BUN: 10 mg/dL (ref 6–20)
CHLORIDE: 114 mmol/L — AB (ref 101–111)
CO2: 23 mmol/L (ref 22–32)
CREATININE: 0.57 mg/dL — AB (ref 0.61–1.24)
Calcium: 8.3 mg/dL — ABNORMAL LOW (ref 8.9–10.3)
GFR calc non Af Amer: >60 mL/min (ref >60)
Glucose, Bld: 92 mg/dL (ref 65–99)
POTASSIUM: 3 mmol/L — AB (ref 3.5–5.1)
SODIUM: 146 mmol/L — AB (ref 135–145)

## 2015-05-13 LAB — GLUCOSE, CAPILLARY
Glucose-Capillary: 83 mg/dL (ref 65–99)
Glucose-Capillary: 107 mg/dL — ABNORMAL HIGH (ref 65–99)

## 2015-05-13 LAB — CULTURE, BAL-QUANTITATIVE W GRAM STAIN

## 2015-05-13 LAB — CULTURE, BAL-QUANTITATIVE

## 2015-05-13 LAB — MAGNESIUM: MAGNESIUM: 1.9 mg/dL (ref 1.7–2.4)

## 2015-05-13 MED ORDER — ENSURE ENLIVE PO LIQD
237.0000 mL | Freq: Three times a day (TID) | ORAL | Status: DC
  Administered 2015-05-13 (×2): 237 mL via ORAL

## 2015-05-13 MED ORDER — POTASSIUM CHLORIDE CRYS ER 20 MEQ PO TBCR
40.0000 meq | EXTENDED_RELEASE_TABLET | Freq: Once | ORAL | Status: AC
  Administered 2015-05-13: 17:00:00 40 meq via ORAL
  Filled 2015-05-13: qty 2

## 2015-05-13 MED ORDER — DOXYCYCLINE HYCLATE 100 MG PO TABS
100.0000 mg | ORAL_TABLET | Freq: Two times a day (BID) | ORAL | Status: DC
  Administered 2015-05-13: 100 mg via ORAL
  Filled 2015-05-13: qty 1

## 2015-05-13 MED ORDER — POTASSIUM CHLORIDE CRYS ER 20 MEQ PO TBCR
20.0000 meq | EXTENDED_RELEASE_TABLET | Freq: Once | ORAL | Status: AC
  Administered 2015-05-13: 17:00:00 20 meq via ORAL
  Filled 2015-05-13: qty 1

## 2015-05-13 MED ORDER — SODIUM CHLORIDE 0.9 % IV SOLN
INTRAVENOUS | Status: DC
  Administered 2015-05-14: 06:00:00 via INTRAVENOUS

## 2015-05-13 MED ORDER — ENSURE ENLIVE PO LIQD: 237.0000 mL | Freq: Two times a day (BID) | ORAL | Status: DC

## 2015-05-13 MED ORDER — POTASSIUM & SODIUM PHOSPHATES 280-160-250 MG PO PACK
1.0000 | PACK | Freq: Three times a day (TID) | ORAL | Status: DC
  Administered 2015-05-13 – 2015-05-14 (×5): 1 via ORAL
  Filled 2015-05-13 (×8): qty 1

## 2015-05-13 MED ORDER — MAGNESIUM OXIDE 400 (241.3 MG) MG PO TABS
400.0000 mg | ORAL_TABLET | Freq: Two times a day (BID) | ORAL | Status: DC
  Administered 2015-05-13 (×2): 400 mg via ORAL
  Filled 2015-05-13 (×2): qty 1

## 2015-05-13 MED ORDER — POTASSIUM CHLORIDE CRYS ER 20 MEQ PO TBCR
40.0000 meq | EXTENDED_RELEASE_TABLET | Freq: Once | ORAL | Status: AC
  Administered 2015-05-13: 11:00:00 40 meq via ORAL
  Filled 2015-05-13: qty 2

## 2015-05-13 NOTE — Plan of Care (Signed)
Problem: Discharge Progression Outcomes Goal: Other Discharge Outcomes/Goals Outcome: Progressing Patient has no complaints of pain. VSS. Tolerating diet, no complaints. Walked around unit with physical therapy. EGD scheduled for tomorrow.

## 2015-05-13 NOTE — Plan of Care (Signed)
Problem: Discharge Progression Outcomes Goal: Other Discharge Outcomes/Goals Outcome: Progressing Pt transferred from CCU yesterday. Foley and PICC line removed prior to arrival to this unit. Pt voided incontinent within the 8 hour window post foley removal. Pt denies pain. Dry, NP cough with rhonchi present. Vital signs stable. No active bleeding this shift. Continues on contact isolation for MRSA.

## 2015-05-13 NOTE — Consult Note (Signed)
  GI Inpatient Follow-up Note  Patient Identification: Manuel Jimenez is a 54 y.o. male with anemia and liver cirrhosis.  Subjective: Events of last few days noted. Pt extubated. Alert. Breathing ok with good O2 sat. Ate breakfast short time ago.  Scheduled Inpatient Medications:  . amoxicillin-clavulanate  1 tablet Oral Q12H  . feeding supplement (ENSURE ENLIVE)  237 mL Oral TID WC  . magnesium oxide  400 mg Oral BID  . pantoprazole  40 mg Oral Q1200  . potassium & sodium phosphates  1 packet Oral TID WC & HS  . potassium chloride  20 mEq Oral Once    Continuous Inpatient Infusions:     PRN Inpatient Medications:  acetaminophen **OR** acetaminophen, ipratropium-albuterol, LORazepam, oxyCODONE  Review of Systems: Constitutional: Weight is stable.  Eyes: No changes in vision. ENT: No oral lesions, sore throat.  GI: see HPI.  Heme/Lymph: No easy bruising.  CV: No chest pain.  GU: No hematuria.  Integumentary: No rashes.  Neuro: No headaches.  Psych: No depression/anxiety.  Endocrine: No heat/cold intolerance.  Allergic/Immunologic: No urticaria.  Resp: No cough, SOB.  Musculoskeletal: No joint swelling.    Physical Examination: BP 151/77 mmHg  Pulse 94  Temp(Src) 99.1 F (37.3 C) (Oral)  Resp 18  Ht 5\' 11"  (1.803 m)  Wt 67.1 kg (147 lb 14.9 oz)  BMI 20.64 kg/m2  SpO2 99% Gen: NAD, alert and oriented x 4 HEENT: PEERLA, EOMI, Neck: supple, no JVD or thyromegaly Chest: CTA bilaterally, no wheezes, crackles, or other adventitious sounds CV: RRR, no m/g/c/r Abd: soft, NT, ND, +BS in all four quadrants; no HSM, guarding, ridigity, or rebound tenderness Ext: no edema, well perfused with 2+ pulses, Skin: no rash or lesions noted Lymph: no LAD  Data: Lab Results  Component Value Date   WBC 10.0 05/13/2015   HGB 9.3* 05/13/2015   HCT 28.3* 05/13/2015   MCV 81.9 05/13/2015   PLT 140* 05/13/2015    Recent Labs Lab 05/11/15 0359 05/12/15 0509 05/13/15 0537   HGB 8.8* 8.9* 9.3*   Lab Results  Component Value Date   NA 146* 05/13/2015   K 3.0* 05/13/2015   CL 114* 05/13/2015   CO2 23 05/13/2015   BUN 10 05/13/2015   CREATININE 0.57* 05/13/2015   Lab Results  Component Value Date   ALT 20 05/09/2015   AST 22 05/09/2015   ALKPHOS 120 05/09/2015   BILITOT 1.1 05/09/2015    Recent Labs Lab 05/08/15 1124  INR 1.48   Assessment/Plan: Mr. Manuel Jimenez is a 54 y.o. male with possible UGI bleeding. No active bleeding recently.  Recommendations: NPO after MN for EGD tomorrow AM. Make sure K remains above 3.0 please. Thanks. Please call with questions or concerns.  Barnes Florek, Lupita Dawn, MD

## 2015-05-13 NOTE — Progress Notes (Signed)
Patient ID: Manuel Jimenez, male   DOB: 1961-05-20, 54 y.o.   MRN: [id number] [facility] PROGRESS NOTE  PCP: Marinda Elk, MD  HPI/Subjective: Patient feeling weak. He has some shortness of breath and some cough. Family concerned about him with his mental status. He has had some hallucinations over the hospital stay. Patient concerned about slurred speech. He knows what is going to say but can't get it out.  Objective: Filed Vitals:   05/13/15 1419  BP: 141/83  Pulse: 93  Temp: 98.8 F (37.1 C)  Resp: 18   No intake or output data in the 24 hours ending 05/13/15 1625 Filed Weights   05/10/15 0500 05/11/15 0552 05/12/15 0500  Weight: 65.9 kg (145 lb 4.5 oz) 65.4 kg (144 lb 2.9 oz) 67.1 kg (147 lb 14.9 oz)    ROS: Review of Systems  Constitutional: Negative for fever and chills.  Eyes: Negative for blurred vision.  Respiratory: Positive for cough, sputum production and shortness of breath.   Cardiovascular: Negative for chest pain.  Gastrointestinal: Positive for diarrhea. Negative for nausea, vomiting, abdominal pain and constipation.  Genitourinary: Negative for dysuria.  Musculoskeletal: Negative for joint pain.  Neurological: Negative for dizziness and headaches.   Exam: Physical Exam  HENT:  Nose: No mucosal edema.  Mouth/Throat: No oropharyngeal exudate or posterior oropharyngeal edema.  Eyes: Conjunctivae, EOM and lids are normal. Pupils are equal, round, and reactive to light.  Neck: No JVD present. Carotid bruit is not present. No edema present. No thyroid mass and no thyromegaly present.  Cardiovascular: S1 normal and S2 normal.  Exam reveals no gallop.   No murmur heard. Pulses:      Dorsalis pedis pulses are 2+ on the right side, and 2+ on the left side.  Respiratory: No respiratory distress. He has no wheezes. He has no rhonchi. He has no rales.  GI: Soft. Bowel sounds are normal. There is no tenderness.  Musculoskeletal:       Right  ankle: He exhibits swelling.       Left ankle: He exhibits swelling.  Lymphadenopathy:    He has no cervical adenopathy.  Neurological: He is alert. No cranial nerve deficit.  Skin: Skin is warm. No rash noted. Nails show no clubbing.  Psychiatric: He has a normal mood and affect.    Data Reviewed: Basic Metabolic Panel:  Recent Labs Lab 05/06/15 2038  05/08/15 0454 05/09/15 0104 05/10/15 0415 05/11/15 0359 05/12/15 0509 05/13/15 0537  NA 136  < > 144 147* 147* 146* 145 146*  K 2.4*  < > 3.5 3.3* 3.3* 3.2* 3.8 3.0*  CL 104  < > 116* 117* 114* 113* 114* 114*  CO2 25  < > 21* 22 26 28 25 23   GLUCOSE 162*  < > 116* 153* 188* 161* 112* 92  BUN 13  < > 13 12 16 12 16 10   CREATININE 0.67  < > 0.64 0.53* 0.75 0.58* 0.60* 0.57*  CALCIUM 7.5*  < > 7.9* 7.5* 7.8* 7.8* 7.8* 8.3*  MG 1.8  --  1.9  --  2.0  --   --  1.9  PHOS 2.4*  --   --   --  1.7* 2.6  --  2.3*  < > = values in this interval not displayed. Liver Function Tests:  Recent Labs Lab 05/06/15 2038 05/08/15 0454 05/09/15 0104  AST 27 27 22   ALT 28 23 20   ALKPHOS 150* 136* 120  BILITOT 1.3* 1.4* 1.1  PROT 5.5* 5.8* 5.4*  ALBUMIN 1.8* 1.9* 1.7*    Recent Labs Lab 05/07/15 1659  LIPASE 21*    Recent Labs Lab 05/06/15 2038 05/08/15 1124 05/08/15 1807  AMMONIA 35 9 19   CBC:  Recent Labs Lab 05/06/15 2038  05/09/15 1343 05/10/15 0415 05/10/15 2151 05/11/15 0359 05/12/15 0509 05/13/15 0537  WBC 12.5*  < > 8.8 18.5*  --  10.2 8.5 10.0  NEUTROABS 10.9*  --   --   --   --   --   --   --   HGB 5.9*  < > 6.9* 7.9* 7.9* 8.8* 8.9* 9.3*  HCT 18.5*  < > 22.2* 25.7* 25.3* 26.9* 27.6* 28.3*  MCV 78.1*  < > 81.0 81.1  --  82.8 82.3 81.9  PLT 142*  < > 126* 201  --  127* 121* 140*  < > = values in this interval not displayed.  CBG:  Recent Labs Lab 05/12/15 0728 05/12/15 1116 05/12/15 1837 05/12/15 2147 05/13/15 0753  GLUCAP 109* 104* 98 97 83    Recent Results (from the past 240 hour(s))   Culture, blood (routine x 2)     Status: None   Collection Time: 05/08/15  6:05 PM  Result Value Ref Range Status   Specimen Description BLOOD LEFT AC  Final   Special Requests   Final    BOTTLES DRAWN AEROBIC AND ANAEROBIC  AER 2 CC ANA 8CC   Culture NO GROWTH 5 DAYS  Final   Report Status 05/13/2015 FINAL  Final  Culture, blood (routine x 2)     Status: None   Collection Time: 05/08/15  6:25 PM  Result Value Ref Range Status   Specimen Description BLOOD RIGHT AC  Final   Special Requests   Final    BOTTLES DRAWN AEROBIC AND ANAEROBIC  AER 1CC ANA Two Strike   Culture NO GROWTH 5 DAYS  Final   Report Status 05/13/2015 FINAL  Final  MRSA PCR Screening     Status: None   Collection Time: 05/08/15  7:48 PM  Result Value Ref Range Status   MRSA by PCR NEGATIVE NEGATIVE Final    Comment:        The GeneXpert MRSA Assay (FDA approved for NASAL specimens only), is one component of a comprehensive MRSA colonization surveillance program. It is not intended to diagnose MRSA infection nor to guide or monitor treatment for MRSA infections.   Culture, bal-quantitative     Status: None   Collection Time: 05/09/15  9:20 AM  Result Value Ref Range Status   Specimen Description BRONCHIAL ALVEOLAR LAVAGE  Final   Special Requests NONE  Final   Gram Stain   Final    MODERATE WBC SEEN FEW GRAM POSITIVE COCCI RARE GRAM NEGATIVE RODS    Culture   Final    LIGHT GROWTH HAEMOPHILUS INFLUENZAE RARE METHICILLIN RESISTANT STAPHYLOCOCCUS AUREUS RARE ESCHERICHIA COLI CRITICAL RESULT CALLED TO, READ BACK BY AND VERIFIED WITH: [facility] BORBA AT 1025 05/12/15 CTJ    Report Status 05/13/2015 FINAL  Final   Organism ID, Bacteria METHICILLIN RESISTANT STAPHYLOCOCCUS AUREUS  Final   Organism ID, Bacteria ESCHERICHIA COLI  Final      Susceptibility   Escherichia coli - MIC*    AMPICILLIN Value in next row Sensitive      SENSITIVE<=2    CEFTAZIDIME Value in next row Sensitive      SENSITIVE<=1     CEFEPIME Value in next row Sensitive  SENSITIVE<=1    CEFAZOLIN Value in next row Sensitive      SENSITIVE<=4    CEFTRIAXONE Value in next row Sensitive      SENSITIVE<=1    CIPROFLOXACIN Value in next row Sensitive      SENSITIVE<=0.25    GENTAMICIN Value in next row Sensitive      SENSITIVE<=1    IMIPENEM Value in next row Sensitive      SENSITIVE<=0.25    TRIMETH/SULFA Value in next row Sensitive      SENSITIVE<=20    PIP/TAZO Value in next row Sensitive      SENSITIVE<=4    * RARE ESCHERICHIA COLI   Methicillin resistant staphylococcus aureus - MIC*    CEFOXITIN SCREEN Value in next row Resistant      POSITIVECEFOXITIN SCREEN - This test may be used to predict mecA-mediated oxacillin resistance, and it is based on the cefoxitin disk screen test.  The cefoxitin screen and oxacillin work in combination to determine the final interpretation reported for oxacillin.     CIPROFLOXACIN Value in next row Resistant      RESISTANT>=8    ERYTHROMYCIN Value in next row Resistant      RESISTANT>=8    GENTAMICIN Value in next row Sensitive      SENSITIVE<=0.5    OXACILLIN Value in next row Resistant      MODERATELY RESISTANT>=4    TETRACYCLINE Value in next row Sensitive      SENSITIVE<=1    VANCOMYCIN Value in next row Sensitive      SENSITIVE1    TRIMETH/SULFA Value in next row Sensitive      SENSITIVE<=10    CLINDAMYCIN Value in next row Resistant      RESISTANT>=8    LINEZOLID Value in next row Sensitive      SENSITIVE2    LEVOFLOXACIN Value in next row Resistant      RESISTANT>=8    Inducible Clindamycin Value in next row Sensitive      RESISTANT>=8    * RARE METHICILLIN RESISTANT STAPHYLOCOCCUS AUREUS     Studies: Dg Chest Port 1 View  05/12/2015  CLINICAL DATA:  Respiratory failure. EXAM: PORTABLE CHEST 1 VIEW COMPARISON:  05/11/2015. FINDINGS: Interim extubation and removal of NG tube. Right IJ line in stable position. Continued partial clearing of right lower lobe  infiltrate. Mild left base subsegmental atelectasis. No pleural effusion or pneumothorax. IMPRESSION: 1. Interim extubation and removal of NG tube. Right IJ line stable position. 2. Continued partial clearing of right lower lobe infiltrate . Mild left base subsegmental atelectasis. Electronically Signed   By: Marcello Moores  Register   On: 05/12/2015 07:53    Scheduled Meds: . doxycycline  100 mg Oral Q12H  . feeding supplement (ENSURE ENLIVE)  237 mL Oral TID WC  . magnesium oxide  400 mg Oral BID  . pantoprazole  40 mg Oral Q1200  . potassium & sodium phosphates  1 packet Oral TID WC & HS  . potassium chloride  20 mEq Oral Once  . potassium chloride  40 mEq Oral Once    Assessment/Plan:   1. Acute blood loss anemia. Patient was transfused a total of 3 units of packed red blood cells for an initial hemoglobin of 5.9. Today's hemoglobin is up at 9.3. GI to do an endoscopy tomorrow. Patient is on Protonix. I advised him not to take any Goody powder or any anti-inflammatories and no alcohol. 2. Alcohol withdrawal and delirium tremens, ICU psychosis- patient finished Ciwa protocol  and is off Precedex. 3. Acute respiratory failure with hypoxia with aspiration pneumonia. Patient was intubated 05/08/2015 through 05/11/2015. BAL cultures showing rare MRSA and also Escherichia coli. Switch over to doxycycline 4. 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 No anticoagulation with severe anemia and bleeding. 5. Hypokalemia and hypomagnesemia- replace potassium and magnesium orally 6. Weakness physical therapy evaluation. 7. Slurred speech- Will get an MRI of the brain. 8. Ultrasound showing possible liver mass- will end up getting an MRI of the liver or CT scan of the liver soon. 9. Severe protein calorie malnutrition  Code Status:     Code Status Orders        Start     Ordered   05/06/15 2220  Full code   Continuous     05/06/15 2219     Family Communication: Family at  bedside Disposition Plan: To be determined  Consultants:  GI  Critical care specialist  Antibiotics:  Doxycycline  Time spent: 25 minutes  Loletha Grayer  [facility]

## 2015-05-13 NOTE — Progress Notes (Signed)
Nutrition Follow-up  DOCUMENTATION CODES:   Severe malnutrition in context of acute illness/injury  INTERVENTION:   Meals and Snacks: Cater to patient preferences Medical Food Supplement Therapy: will recommend Ensure Enlive po BID, each supplement provides 350 kcal and 20 grams of protein   NUTRITION DIAGNOSIS:   Inadequate oral intake related to inability to eat as evidenced by NPO status, improved with diet advancement  GOAL:   Patient will meet greater than or equal to 90% of their needs; ongoing  MONITOR:    (Energy Intake, Glucose Profile, Electrolyte and Renal Profile, Hepatic Profile, Digestive system)   ASSESSMENT:   Pt admitted with symptomatic anemia per MD note, likely from upper GI bleed. Per MD note, pt with h/o heavy EtOH use, on CIWA, and with abnormal liver function tests. Pt remains on isolation.  Pt s/p extubation transferred back to 1C. Per GI note, pt scheduled for EGD tomorrow am.   Diet Order:  Diet regular Room service appropriate?: Yes; Fluid consistency:: Thin    Current Nutrition: RD took breakfast tray in on visit this am. Pt reports feeling hungry was sipping on soda this am. Pt reports eating 'a little bit' last night at dinner. Pt willing to try Ensure as he has in the past.   Gastrointestinal Profile: Last BM: 05/13/2015   Medications: KCl, Potassium Sodium Phosphate, Mag-Ox, Protonix  Electrolyte/Renal Profile and Glucose Profile:   Recent Labs Lab 05/08/15 0454  05/10/15 0415 05/11/15 0359 05/12/15 0509 05/13/15 0537  NA 144  < > 147* 146* 145 146*  K 3.5  < > 3.3* 3.2* 3.8 3.0*  CL 116*  < > 114* 113* 114* 114*  CO2 21*  < > _0 BUN 13  < > _1 CREATININE 0.64  < > 0.75 0.58* 0.60* 0.57*  CALCIUM 7.9*  < > 7.8* 7.8* 7.8* 8.3*  MG 1.9  --  2.0  --   --  1.9  PHOS  --   --  1.7* 2.6  --  2.3*  GLUCOSE 116*  < > 188* 161* 112* 92  < > = values in this interval not displayed.   Protein Profile:  Recent  Labs Lab 05/06/15 2038 05/08/15 0454 05/09/15 0104  ALBUMIN 1.8* 1.9* 1.7*    Hepatic Function Latest Ref Rng 05/09/2015 05/08/2015 05/06/2015  Total Protein 6.5 - 8.1 g/dL 5.4(L) 5.8(L) 5.5(L)  Albumin 3.5 - 5.0 g/dL 1.7(L) 1.9(L) 1.8(L)  AST 15 - 41 U/L _2 ALT 17 - 63 U/L _3 Alk Phosphatase 38 - 126 U/L 120 136(H) 150(H)  Total Bilirubin 0.3 - 1.2 mg/dL 1.1 1.4(H) 1.3(H)   Nutritional Anemia Profile:  CBC Latest Ref Rng 05/13/2015 05/12/2015 05/11/2015  WBC 3.8 - 10.6 K/uL 10.0 8.5 10.2  Hemoglobin 13.0 - 18.0 g/dL 9.3(L) 8.9(L) 8.8(L)  Hematocrit 40.0 - 52.0 % 28.3(L) 27.6(L) 26.9(L)  Platelets 150 - 440 K/uL 140(L) 121(L) 127(L)     Weight Trend since Admission: Filed Weights   05/10/15 0500 05/11/15 0552 05/12/15 0500  Weight: 145 lb 4.5 oz (65.9 kg) 144 lb 2.9 oz (65.4 kg) 147 lb 14.9 oz (67.1 kg)     Skin:  Reviewed, no issues   BMI:  Body mass index is 20.64 kg/(m^2).  Estimated Nutritional Needs:   Kcal:  4098-1191 kcals (BEE 1506, 1.2 AF, 1.1-1.3 IF)   Protein:  78-98 g (1.2-1.5 g/kg)   Fluid:  1950-2330m of fluid (25-383mkg)  EDUCATION NEEDS:   Education needs no appropriate at this time   [place], [place], LDN Pager [phone]

## 2015-05-13 NOTE — Progress Notes (Signed)
Electrolyte replacement consult  Pharmacy Consult for electrolyte replacement  No Known Allergies  Patient Measurements: Height: 5\' 11"  (180.3 cm) Weight: 147 lb 14.9 oz (67.1 kg) IBW/kg (Calculated) : 75.3  Vital Signs: Temp: 99.1 F (37.3 C) (10/12 0525) Temp Source: Oral (10/12 0525) BP: 151/77 mmHg (10/12 0525) Pulse Rate: 94 (10/12 0525) ILabs:  Recent Labs  05/11/15 0359 05/12/15 0509 05/13/15 0537  WBC 10.2 8.5 10.0  HGB 8.8* 8.9* 9.3*  HCT 26.9* 27.6* 28.3*  PLT 127* 121* 140*     Recent Labs  05/11/15 0359 05/12/15 0509 05/13/15 0537  NA 146* 145 146*  K 3.2* 3.8 3.0*  CL 113* 114* 114*  CO2 28 25 23   GLUCOSE 161* 112* 92  BUN 12 16 10   CREATININE 0.58* 0.60* 0.57*  CALCIUM 7.8* 7.8* 8.3*  MG  --   --  1.9  PHOS 2.6  --  2.3*   Estimated Creatinine Clearance: 99 mL/min (by C-G formula based on Cr of 0.57).   MAGNESIUM  Date/Time Value Ref Range Status  05/13/2015 05:37 AM 1.9 1.7 - 2.4 mg/dL Final     Recent Labs  05/12/15 1837 05/12/15 2147 05/13/15 0753  GLUCAP 98 97 83    Medical History: Past Medical History  Diagnosis Date  . Hypertension    Assessment: Pharmacy consulted to monitor and replace electrolytes in this 54 year old male. Potassium, phosphorus low.   Plan:  Give K-phos 250mg  qid for 2 days- recheck phos following 2 days of replacement. K 40 MEQ once ordered this AM. Pt will receive 4.4 Meq per day of K from K-phos. Will give an additional 20 MEQ this evening. Recheck in AM  Jaylia Pettus D Fe Okubo, Pharm.D Clinical Pharmacist   05/13/2015 8:00 AM

## 2015-05-13 NOTE — Progress Notes (Signed)
Physical Therapy Treatment Patient Details Name: Manuel Jimenez MRN: [id number] DOB: 1961-01-10 Today's Date: 05/13/2015    History of Present Illness Manuel Jimenez is a 54 y.o. male with a known history of essential hypertension, alcohol abuse presenting with generalized fatigue/weakness. He describes approximately 3-4 week total duration of progressive fatigue and generalized weakness. He also complains of associated epigastric pain, "pain" when asked to further qualify, nonradiating, intensity 4/10 no worsening or relieving factors. Also describes having dyspnea on exertion with his fatigue. Given progressive symptoms decided presents to Hospital further workup and evaluation found to be anemic with hemoglobin of 5.9. Emergency department course: Transfusion 2 unit packed red blood cells initiated. Pt has since been intubated (10/8) and extubated (10/10) with another 2 units of blood transfused.     PT Comments    Pt is still confused at baseline today which was apparent through statements he made throughout the session and his inability to follow directional commands during gait and poor attention/ becoming easily distracted while ambulating in hallway. Pt is able to demonstrate independence with bed mobility. All other transfers and functional mobility pt requires CGA-min assist for safety with movement. Pt is initially unsteady on feet during static stance after performing sit<>stand transfer. When ambulating pt still tends to drift toward his R side and requires min assist to keep on a straight path. Pt displayed poor obstacle avoidance when ambulating in hallway and would tend to drift into objects and people. Pt also became easily distracted by the surrounding environment. Pt poses a significant fall risk due to the baseline confusion that he is experiencing and his tendency to drift when walking. Pt will continue to benefit from skilled acute PT in order to work on safe functional mobility and  increasing endurance. Current d/c recommendations are still appropriate.   Follow Up Recommendations  Home health PT     Equipment Recommendations       Recommendations for Other Services       Precautions / Restrictions Precautions Precautions: Fall Restrictions Weight Bearing Restrictions: No    Mobility  Bed Mobility Overal bed mobility: Independent             General bed mobility comments: pt able to transfer supine <>sitting on EOB independently; displays adequate core strength for transfers  Transfers Overall transfer level: Needs assistance Equipment used: None Transfers: Sit to/from Stand           General transfer comment: CGA-min assist +1 for sit <>stand transfers; pt failed on first attempt getting up today but was able to transfer successfully on second attempt with min assist. Sit <>stand transfer from low toilet requierd min assist as well. Pt kept a more narrow BOS today with transfers. Initially unsteady on his feet when standing.   Ambulation/Gait Ambulation/Gait assistance: Min assist Ambulation Distance (Feet): 300 Feet Assistive device: None Gait Pattern/deviations: Step-through pattern;Decreased step length - right;Decreased step length - left;Decreased stride length;Decreased dorsiflexion - right;Decreased dorsiflexion - left;Wide base of support Gait velocity: decreased Gait velocity interpretation: Below normal speed for age/gender General Gait Details: Pt still tends to drift toward the R if not given min assist to keep on a straight path. Pt displays poor obstacle avoidance (will drift right into objects/people in the hallway if allowed). Pt becomes easily distracted by environment and tends to drift toward the direction he's looking.    Stairs            Wheelchair Mobility    Modified Rankin (Stroke Patients Only)  Balance Overall balance assessment: Needs assistance Sitting-balance support: Feet supported;No upper  extremity supported Sitting balance-Leahy Scale: Good     Standing balance support: No upper extremity supported Standing balance-Leahy Scale: Fair Standing balance comment: pt does well with static standing balance but dynamic standing balance tasks such as walking put pt at a fall risk due to drifting toward R side and poor obstacle avoidance                    Cognition Arousal/Alertness: Awake/alert Behavior During Therapy: WFL for tasks assessed/performed Overall Cognitive Status: Impaired/Different from baseline Area of Impairment: Attention;Safety/judgement;Awareness;Problem solving   Current Attention Level: Divided     Safety/Judgement: Decreased awareness of safety;Decreased awareness of deficits   Problem Solving: Slow processing;Requires verbal cues General Comments: Pt would make statements throughout session which suggest that he is quite confused. Pt is easliy distracted by environment and tends to vear of course when ambulating. Does not follow directional commands well.     Exercises Other Exercises Other Exercises: toilet transfer (sit<>stand) to and from low toilet surface: min assist and no assistive device; pt used grab bars on wall to brace himself on way down to toilet    General Comments        Pertinent Vitals/Pain Pain Assessment: No/denies pain    Home Living                      Prior Function            PT Goals (current goals can now be found in the care plan section) Acute Rehab PT Goals Patient Stated Goal: to go home PT Goal Formulation: With patient Time For Goal Achievement: 05/26/15 Potential to Achieve Goals: Fair Progress towards PT goals: Progressing toward goals    Frequency  Min 2X/week    PT Plan Current plan remains appropriate    Co-evaluation             End of Session Equipment Utilized During Treatment: Gait belt Activity Tolerance: Patient tolerated treatment well Patient left: in  chair;with call bell/phone within reach;with chair alarm set;with family/visitor present     Time: 2395-3202 PT Time Calculation (min) (ACUTE ONLY): 25 min  Charges:                       G CodesMilon Score 05/13/2015, 10:02 AM

## 2015-05-14 ENCOUNTER — Inpatient Hospital Stay: Payer: MEDICAID | Admitting: Anesthesiology

## 2015-05-14 ENCOUNTER — Inpatient Hospital Stay: Payer: Self-pay | Admitting: Anesthesiology

## 2015-05-14 ENCOUNTER — Encounter
Admission: EM | Disposition: A | Discharge status: Home / Self Care | Payer: Self-pay | Source: Home / Self Care | Attending: Internal Medicine

## 2015-05-14 ENCOUNTER — Encounter: Payer: Self-pay | Admitting: Anesthesiology

## 2015-05-14 HISTORY — PX: ESOPHAGOGASTRODUODENOSCOPY: SHX5428

## 2015-05-14 LAB — BASIC METABOLIC PANEL
Anion gap: 5 (ref 5–15)
BUN: 7 mg/dL (ref 6–20)
CHLORIDE: 111 mmol/L (ref 101–111)
CO2: 26 mmol/L (ref 22–32)
CREATININE: 0.65 mg/dL (ref 0.61–1.24)
Calcium: 8.5 mg/dL — ABNORMAL LOW (ref 8.9–10.3)
GFR calc Af Amer: >60 mL/min (ref >60)
GLUCOSE: 103 mg/dL — AB (ref 65–99)
POTASSIUM: 3.7 mmol/L (ref 3.5–5.1)
SODIUM: 142 mmol/L (ref 135–145)

## 2015-05-14 LAB — GLUCOSE, CAPILLARY
GLUCOSE-CAPILLARY: 99 mg/dL (ref 65–99)
GLUCOSE-CAPILLARY: 127 mg/dL — AB (ref 65–99)
Glucose-Capillary: 120 mg/dL — ABNORMAL HIGH (ref 65–99)
Glucose-Capillary: 129 mg/dL — ABNORMAL HIGH (ref 65–99)

## 2015-05-14 SURGERY — EGD (ESOPHAGOGASTRODUODENOSCOPY)
Anesthesia: General

## 2015-05-14 MED ORDER — FERROUS SULFATE 325 (65 FE) MG PO TABS: 325.0000 mg | ORAL_TABLET | Freq: Every day | ORAL | Status: DC

## 2015-05-14 MED ORDER — MIDAZOLAM HCL 2 MG/2ML IJ SOLN
INTRAMUSCULAR | Status: DC | PRN
  Administered 2015-05-14: 1 mg via INTRAVENOUS

## 2015-05-14 MED ORDER — MAGNESIUM OXIDE 400 (241.3 MG) MG PO TABS: 400.0000 mg | ORAL_TABLET | Freq: Every day | ORAL | Status: DC

## 2015-05-14 MED ORDER — ENSURE ENLIVE PO LIQD: 237.0000 mL | Freq: Three times a day (TID) | ORAL | Status: DC

## 2015-05-14 MED ORDER — PROPOFOL 500 MG/50ML IV EMUL
INTRAVENOUS | Status: DC | PRN
  Administered 2015-05-14: 120 ug/kg/min via INTRAVENOUS

## 2015-05-14 MED ORDER — DOXYCYCLINE HYCLATE 100 MG PO TABS: 100.0000 mg | ORAL_TABLET | Freq: Two times a day (BID) | ORAL | Status: DC

## 2015-05-14 MED ORDER — FENTANYL CITRATE (PF) 100 MCG/2ML IJ SOLN
INTRAMUSCULAR | Status: DC | PRN
  Administered 2015-05-14: 50 ug via INTRAVENOUS

## 2015-05-14 MED ORDER — IPRATROPIUM-ALBUTEROL 0.5-2.5 (3) MG/3ML IN SOLN: 3.0000 mL | Freq: Once | RESPIRATORY_TRACT | Status: DC

## 2015-05-14 MED ORDER — OMEPRAZOLE 20 MG PO CPDR: 20.0000 mg | DELAYED_RELEASE_CAPSULE | Freq: Every day | ORAL | Status: DC

## 2015-05-14 NOTE — Progress Notes (Signed)
Physical Therapy Treatment Patient Details Name: Manuel Jimenez MRN: [id number] DOB: 1961/04/19 Today's Date: 05/14/2015    History of Present Illness Manuel Jimenez is a 54 y.o. male with a known history of essential hypertension, alcohol abuse presenting with generalized fatigue/weakness. He describes approximately 3-4 week total duration of progressive fatigue and generalized weakness. He also complains of associated epigastric pain, "pain" when asked to further qualify, nonradiating, intensity 4/10 no worsening or relieving factors. Also describes having dyspnea on exertion with his fatigue. Given progressive symptoms decided presents to Hospital further workup and evaluation found to be anemic with hemoglobin of 5.9. Emergency department course: Transfusion 2 unit packed red blood cells initiated. Pt has since been intubated (10/8) and extubated (10/10) with another 2 units of blood transfused.     PT Comments    Patient status post EGD this AM; sleeping upon arrival, but easily arousable. Patient reporting L ankle soreness with initial WBing efforts, but resolves with progressive mobility.  Able to complete all mobility (sit/stand, basic transfers, gait x200' and stairs x6 with bilat rails) with RW and no greater than cga/min assist.  Improved dynamic balance and overall safety with use of RW; recommend continued use upon discharge.  Per patient report, patient has one at home; [facility] to verify with girlfriend prior to discharge. Remains globally confused and generally unaware of deficits and safety needs; constant verbal instruction for overall safety awareness with mobility.  Recommend strict 24 hour sup/assist upon discharge; per notes, girlfriend Manuel Jimenez aware and able to provide upon discharge.   Follow Up Recommendations  Home health PT;Supervision/Assistance - 24 hour     Equipment Recommendations  Rolling walker with 5" wheels    Recommendations for Other Services        Precautions / Restrictions Precautions Precautions: Fall Precaution Comments: contact isolation Restrictions Weight Bearing Restrictions: No    Mobility  Bed Mobility Overal bed mobility: Modified Independent                Transfers Overall transfer level: Needs assistance Equipment used: Rolling walker (2 wheeled) Transfers: Sit to/from Stand Sit to Stand: Supervision;Min guard         General transfer comment: requires UE support for successful sit to stand transfers, esp from lower surface heights (standard toilet)  Ambulation/Gait Ambulation/Gait assistance: Min guard Ambulation Distance (Feet): 200 Feet Assistive device: Rolling walker (2 wheeled)       General Gait Details: step through gait pattern with fair step height/length bilat.  initially reporting L ankle soreness, but resolves with progressive mobility.  Improved ability to maintain midline and overall balance this date; optimal safety/indep with use of RW at this time.   Stairs Stairs: Yes Stairs assistance: Min assist Stair Management: Two rails Number of Stairs: 6 General stair comments: mixed step to and step through gait pattern; fair safety/stability without overt buckling or LOB.  Does require use of rails for safety; reports having bilat rails available at home.  Wheelchair Mobility    Modified Rankin (Stroke Patients Only)       Balance Overall balance assessment: Needs assistance Sitting-balance support: No upper extremity supported;Feet supported Sitting balance-Leahy Scale: Good     Standing balance support: Bilateral upper extremity supported Standing balance-Leahy Scale: Fair                      Cognition Arousal/Alertness: Awake/alert Behavior During Therapy: WFL for tasks assessed/performed;Impulsive   Area of Impairment: Attention;Safety/judgement;Awareness;Problem solving Orientation Level: Disoriented to;Place;Time;Situation  Memory: Decreased  short-term memory   Safety/Judgement: Decreased awareness of safety;Decreased awareness of deficits          Exercises Other Exercises Other Exercises: Toilet transfer, ambulatory with RW, cga/min assist for safety with surface changes and narrowed spaces.  Patient initally attempting to walk backwards into bathroom; mod redirection for safety with transition.  Sit/stand with grab bars, min assist from lower surface heights.    General Comments        Pertinent Vitals/Pain Pain Assessment: No/denies pain    Home Living                      Prior Function            PT Goals (current goals can now be found in the care plan section) Acute Rehab PT Goals Patient Stated Goal: to go home PT Goal Formulation: With patient Time For Goal Achievement: 05/26/15 Potential to Achieve Goals: Fair Progress towards PT goals: Progressing toward goals    Frequency  Min 2X/week    PT Plan Current plan remains appropriate    Co-evaluation             End of Session Equipment Utilized During Treatment: Gait belt Activity Tolerance: Patient tolerated treatment well Patient left: with call bell/phone within reach;in bed;with bed alarm set     Time: 8850-2774 PT Time Calculation (min) (ACUTE ONLY): 21 min  Charges:  $Gait Training: 8-22 mins                    G Codes:      Matthew Pais H. Owens Shark, PT, DPT, NCS 05/14/2015, 3:09 PM [phone]

## 2015-05-14 NOTE — Transfer of Care (Signed)
Immediate Anesthesia Transfer of Care Note  Patient: Manuel Jimenez  Procedure(s) Performed: Procedure(s): ESOPHAGOGASTRODUODENOSCOPY (EGD) (N/A)  Patient Location: PACU and Endoscopy Unit  Anesthesia Type:General  Level of Consciousness: awake, alert  and oriented  Airway & Oxygen Therapy: Patient Spontanous Breathing and Patient connected to nasal cannula oxygen  Post-op Assessment: Report given to RN and Post -op Vital signs reviewed and stable  Post vital signs: Reviewed and stable  Last Vitals:  Filed Vitals:   05/14/15 0844  BP: 111/88  Pulse: 95  Temp: 36.7 C  Resp: 22    Complications: No apparent anesthesia complications

## 2015-05-14 NOTE — Progress Notes (Signed)
Electrolyte replacement consult  Pharmacy Consult for electrolyte replacement  No Known Allergies  Patient Measurements: Height: 5\' 11"  (180.3 cm) Weight: 147 lb 14.9 oz (67.1 kg) IBW/kg (Calculated) : 75.3  Vital Signs: Temp: 97.8 F (36.6 C) (10/13 0851) Temp Source: Tympanic (10/13 0844) BP: 118/76 mmHg (10/13 0910) Pulse Rate: 93 (10/13 0910) ILabs:  Recent Labs  05/12/15 0509 05/13/15 0537  WBC 8.5 10.0  HGB 8.9* 9.3*  HCT 27.6* 28.3*  PLT 121* 140*     Recent Labs  05/12/15 0509 05/13/15 0537 05/14/15 0621  NA 145 146* 142  K 3.8 3.0* 3.7  CL 114* 114* 111  CO2 25 23 26   GLUCOSE 112* 92 103*  BUN 16 10 7   CREATININE 0.60* 0.57* 0.65  CALCIUM 7.8* 8.3* 8.5*  MG  --  1.9  --   PHOS  --  2.3*  --    Estimated Creatinine Clearance: 99 mL/min (by C-G formula based on Cr of 0.65).   MAGNESIUM  Date/Time Value Ref Range Status  05/13/2015 05:37 AM 1.9 1.7 - 2.4 mg/dL Final     Recent Labs  05/13/15 2138 05/14/15 0710 05/14/15 1001  GLUCAP 107* 99 120*    Medical History: Past Medical History  Diagnosis Date  . Hypertension    Assessment: Pharmacy consulted to monitor and replace electrolytes in this 54 year old male.   All electrolytes are within normal limits.   Plan:  Continue patient's current orders of PhosNak packets TID wm and at bed time x 2 days. Pharmacy to follow. Order electrolyte panel with am labs.   Larene Beach, PharmD  Clinical Pharmacist   05/14/2015 11:13 AM

## 2015-05-14 NOTE — Anesthesia Preprocedure Evaluation (Signed)
Anesthesia Evaluation  Patient identified by MRN, date of birth, ID band Patient awake    Reviewed: Allergy & Precautions, H&P , NPO status , Patient's Chart, lab work & pertinent test results  Airway Mallampati: II  TM Distance: >3 FB Neck ROM: limited    Dental  (+) Poor Dentition   Pulmonary pneumonia, unresolved, Current Smoker,    Pulmonary exam normal breath sounds clear to auscultation       Cardiovascular Exercise Tolerance: Good hypertension, (-) angina(-) Past MI and (-) DOE Normal cardiovascular exam Rhythm:regular Rate:Normal     Neuro/Psych PSYCHIATRIC DISORDERS negative neurological ROS     GI/Hepatic negative GI ROS, Neg liver ROS,   Endo/Other  negative endocrine ROS  Renal/GU negative Renal ROS  negative genitourinary   Musculoskeletal   Abdominal   Peds  Hematology negative hematology ROS (+)   Anesthesia Other Findings Past Medical History:   Hypertension                                                BMI    Body Mass Index   20.64 kg/m 2      Reproductive/Obstetrics negative OB ROS                             Anesthesia Physical Anesthesia Plan  ASA: III  Anesthesia Plan: General   Post-op Pain Management:    Induction:   Airway Management Planned:   Additional Equipment:   Intra-op Plan:   Post-operative Plan:   Informed Consent: I have reviewed the patients History and Physical, chart, labs and discussed the procedure including the risks, benefits and alternatives for the proposed anesthesia with the patient or authorized representative who has indicated his/her understanding and acceptance.   Dental Advisory Given  Plan Discussed with: Anesthesiologist, CRNA and Surgeon  Anesthesia Plan Comments:         Anesthesia Quick Evaluation

## 2015-05-14 NOTE — Plan of Care (Signed)
Problem: Discharge Progression Outcomes Goal: Other Discharge Outcomes/Goals Outcome: Progressing Pt remain NPO for procedure some time today,alert with periods of confusion, c/o being anxious during this shift antianxiety med given with satisfactory result, ambulating to bathroom with assist, voiding without difficulty.

## 2015-05-14 NOTE — Care Management (Signed)
Will arrange a rolling walker, home health and physical therapy thru [facility]. Girlfriend will transport. Shelbie Ammons RN MSN Care management [phone]

## 2015-05-14 NOTE — Op Note (Signed)
EGD showed benign GE junction stricture, that was dilated with TTS balloon. Pt also had mild to moderate portal gastropathy, with no active bleeding at this time. Diet ordered. Will need colonoscopy as outpatient later once respiratory status stabilizes. thanks

## 2015-05-14 NOTE — Anesthesia Postprocedure Evaluation (Signed)
  Anesthesia Post-op Note  Patient: Manuel Jimenez  Procedure(s) Performed: Procedure(s): ESOPHAGOGASTRODUODENOSCOPY (EGD) (N/A)  Anesthesia type:General  Patient location: PACU  Post pain: Pain level controlled  Post assessment: Post-op Vital signs reviewed, Patient's Cardiovascular Status Stable, Respiratory Function Stable, Patent Airway and No signs of Nausea or vomiting  Post vital signs: Reviewed and stable  Last Vitals:  Filed Vitals:   05/14/15 0910  BP: 118/76  Pulse: 93  Temp:   Resp: 24    Level of consciousness: awake, alert  and patient cooperative  Complications: No apparent anesthesia complications

## 2015-05-14 NOTE — Plan of Care (Signed)
Problem: Discharge Progression Outcomes Goal: Activity appropriate for discharge plan Outcome: Progressing Pt is alert with confusion, egd performed in am without intervention, pt will be discharged on protonix. Pt denies pain, growing increasingly confused throughout shift, poor appetite, roaming in room, 2 episodes of loose stool, hgb stable at 9.4, physical therapy reevaluated patient and recommends 24 hour care and walker, pt's gf is committed to stay home with patient until he improves, pt will use walker from his mother, spoke with gf in person and gave update to mother over the phone, patient is on room air, continues to have congested cough and will be d/c on doxycycline. Prescriptions provided to gf, recommended to take rx to [facility] for 4$ list. Reviewed d/c instructions with patient and gf, family reports understanding of d/c instructions, pt is to f/u with pcp at [facility] clinic. Pt refused follow up ct of abdomen and wishes to go home.

## 2015-05-14 NOTE — Discharge Summary (Signed)
[name] NAME: [name]    MR#:  [id number]  DATE OF BIRTH:  19-Jul-1961  DATE OF ADMISSION:  05/06/2015 ADMITTING PHYSICIAN: Lytle Butte, MD  DATE OF DISCHARGE: 05/14/2015  PRIMARY CARE PHYSICIAN: Marinda Elk, MD    ADMISSION DIAGNOSIS:  Hypokalemia [E87.6] Occult GI bleeding [R19.5] Symptomatic anemia [D64.9]  DISCHARGE DIAGNOSIS:  Principal Problem:   Symptomatic anemia Active Problems:   Hypokalemia   Protein-calorie malnutrition, severe   SOB (shortness of breath)   Occult GI bleeding   Aspiration pneumonia (HCC)   Confusion   Distended abdomen   Pneumonia   SECONDARY DIAGNOSIS:   Past Medical History  Diagnosis Date  . Hypertension     HOSPITAL COURSE:   1. Acute on chronic blood loss anemia. The patient was transfused a total of 3 units of packed red blood cells on an initial hemoglobin of 5.9. Yesterday's hemoglobin was 9.3. Endoscopy done on 05/14/2015 showed an esophageal stricture that was dilated and portal gastropathy. Patient has been on PPI since the entire time in the hospital. Patient was advised to stop taking Goody powder and other NSAIDs and alcohol must be stopped also. 2. Acute alcohol withdrawal and delirium tremens and ICU psychosis- patient finished Ciwa protocol and was on Precedex drip in the ICU. Once the patient was transferred to the floor he wanted to go home. 3. Acute respiratory failure with hypoxia with aspiration pneumonia patient was intubated on 05/08/2015 and extubated on 05/11/2015 BAL cultures grew out MRSA and Escherichia coli. Antibiotics were switched over to doxycycline. 4. 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 Hypokalemia and hypomagnesemia- these were replaced orally during the hospital course 6. Weakness physical therapy recommended home with home health. 7. Slurred speech- MRI of the  brain was negative for acute stroke 8. Ultrasound showing possible liver mass. I explained that I wanted to get a CT scan to further evaluate the ultrasound findings. The patient absolutely refused to get a CT scan and wanted to go home. I will be unable to get this test before he goes home secondary to the patient refusing. I explained that I would like to make sure that this is not a liver cancer before he goes home. Patient still refused. 9. Severe protein calorie malnutrition- and sure prescribed upon discharge  DISCHARGE CONDITIONS:   Fair  CONSULTS OBTAINED:  Treatment Team:  Lytle Butte, MD  DRUG ALLERGIES:  No Known Allergies  DISCHARGE MEDICATIONS:   Current Discharge Medication List    START taking these medications   Details  doxycycline (VIBRA-TABS) 100 MG tablet Take 1 tablet (100 mg total) by mouth every 12 (twelve) hours. Qty: 18 tablet, Refills: 0    feeding supplement, ENSURE ENLIVE, (ENSURE ENLIVE) LIQD Take 237 mLs by mouth 3 (three) times daily with meals. Qty: 90 Bottle, Refills: 12    ferrous sulfate 325 (65 FE) MG tablet Take 1 tablet (325 mg total) by mouth daily with breakfast. Qty: 30 tablet, Refills: 0    magnesium oxide (MAG-OX) 400 (241.3 MG) MG tablet Take 1 tablet (400 mg total) by mouth daily. Qty: 30 tablet, Refills: 0    omeprazole (PRILOSEC) 20 MG capsule Take 1 capsule (20 mg total) by mouth daily. Qty: 30 capsule, Refills: 0      STOP taking these medications     Aspirin-Acetaminophen-Caffeine (GOODY HEADACHE PO)  DISCHARGE INSTRUCTIONS:   Follow-up with PMD 1-2 weeks Follow-up with Dr. Candace Cruise gastroenterology 2-3 weeks for colonoscopy  If you experience worsening of your admission symptoms, develop shortness of breath, life threatening emergency, suicidal or homicidal thoughts you must seek medical attention immediately by calling 911 or calling your MD immediately  if symptoms less severe.  You Must read complete  instructions/literature along with all the possible adverse reactions/side effects for all the Medicines you take and that have been prescribed to you. Take any new Medicines after you have completely understood and accept all the possible adverse reactions/side effects.   Please note  You were cared for by a hospitalist during your hospital stay. If you have any questions about your discharge medications or the care you received while you were in the hospital after you are discharged, you can call the unit and asked to speak with the hospitalist on call if the hospitalist that took care of you is not available. Once you are discharged, your primary care physician will handle any further medical issues. Please note that NO REFILLS for any discharge medications will be authorized once you are discharged, as it is imperative that you return to your primary care physician (or establish a relationship with a primary care physician if you do not have one) for your aftercare needs so that they can reassess your need for medications and monitor your lab values.    Today   CHIEF COMPLAINT:   Chief Complaint  Patient presents with  . Fatigue    HISTORY OF PRESENT ILLNESS:  Manuel Jimenez  is a 54 y.o. male with a known history of alcohol abuse. Patient was found to have a severely low hemoglobin on presentation.   VITAL SIGNS:  Blood pressure 118/76, pulse 93, temperature 97.8 F (36.6 C), temperature source Tympanic, resp. rate 24, height 5\' 11"  (1.803 m), weight 67.1 kg (147 lb 14.9 oz), SpO2 98 %.    PHYSICAL EXAMINATION:  GENERAL:  54 y.o.-year-old patient lying in the bed with no acute distress.  EYES: Pupils equal, round, reactive to light and accommodation. No scleral icterus. Extraocular muscles intact.  HEENT: Head atraumatic, normocephalic. Oropharynx and nasopharynx clear.  NECK:  Supple, no jugular venous distention. No thyroid enlargement, no tenderness.  LUNGS: Normal breath  sounds bilaterally, no wheezing  or rales, scattered rhonchi. No use of accessory muscles of respiration.  CARDIOVASCULAR: S1, S2 normal. No murmurs, rubs, or gallops.  ABDOMEN: Soft, non-tender, non-distended. Bowel sounds present. No organomegaly or mass.  EXTREMITIES: No pedal edema, cyanosis, or clubbing.  NEUROLOGIC: Cranial nerves II through XII are intact. Muscle strength 5/5 in all extremities. Sensation intact. Gait observed while he is walking to the bathroom with the nurse was pretty unsteady. PSYCHIATRIC: The patient is alert and answers questions appropriately.  SKIN: No obvious rash, lesion, or ulcer.   DATA REVIEW:   CBC  Recent Labs Lab 05/13/15 0537  WBC 10.0  HGB 9.3*  HCT 28.3*  PLT 140*    Chemistries   Recent Labs Lab 05/09/15 0104  05/13/15 0537 05/14/15 0621  NA 147*  < > 146* 142  K 3.3*  < > 3.0* 3.7  CL 117*  < > 114* 111  CO2 22  < > 23 26  GLUCOSE 153*  < > 92 103*  BUN 12  < > 10 7  CREATININE 0.53*  < > 0.57* 0.65  CALCIUM 7.5*  < > 8.3* 8.5*  MG  --   < >  1.9  --   AST 22  --   --   --   ALT 20  --   --   --   ALKPHOS 120  --   --   --   BILITOT 1.1  --   --   --   < > = values in this interval not displayed.  Cardiac Enzymes No results for input(s): TROPONINI in the last 168 hours.  Microbiology Results  Results for orders placed or performed during the hospital encounter of 05/06/15  Culture, blood (routine x 2)     Status: None   Collection Time: 05/08/15  6:05 PM  Result Value Ref Range Status   Specimen Description BLOOD LEFT AC  Final   Special Requests   Final    BOTTLES DRAWN AEROBIC AND ANAEROBIC  AER 2 CC ANA 8CC   Culture NO GROWTH 5 DAYS  Final   Report Status 05/13/2015 FINAL  Final  Culture, blood (routine x 2)     Status: None   Collection Time: 05/08/15  6:25 PM  Result Value Ref Range Status   Specimen Description BLOOD RIGHT AC  Final   Special Requests   Final    BOTTLES DRAWN AEROBIC AND ANAEROBIC  AER 1CC  ANA Keeler   Culture NO GROWTH 5 DAYS  Final   Report Status 05/13/2015 FINAL  Final  MRSA PCR Screening     Status: None   Collection Time: 05/08/15  7:48 PM  Result Value Ref Range Status   MRSA by PCR NEGATIVE NEGATIVE Final    Comment:        The GeneXpert MRSA Assay (FDA approved for NASAL specimens only), is one component of a comprehensive MRSA colonization surveillance program. It is not intended to diagnose MRSA infection nor to guide or monitor treatment for MRSA infections.   Culture, bal-quantitative     Status: None   Collection Time: 05/09/15  9:20 AM  Result Value Ref Range Status   Specimen Description BRONCHIAL ALVEOLAR LAVAGE  Final   Special Requests NONE  Final   Gram Stain   Final    MODERATE WBC SEEN FEW GRAM POSITIVE COCCI RARE GRAM NEGATIVE RODS    Culture   Final    LIGHT GROWTH HAEMOPHILUS INFLUENZAE RARE METHICILLIN RESISTANT STAPHYLOCOCCUS AUREUS RARE ESCHERICHIA COLI CRITICAL RESULT CALLED TO, READ BACK BY AND VERIFIED WITH: [facility] BORBA AT 7846 05/12/15 CTJ    Report Status 05/13/2015 FINAL  Final   Organism ID, Bacteria METHICILLIN RESISTANT STAPHYLOCOCCUS AUREUS  Final   Organism ID, Bacteria ESCHERICHIA COLI  Final      Susceptibility   Escherichia coli - MIC*    AMPICILLIN Value in next row Sensitive      SENSITIVE<=2    CEFTAZIDIME Value in next row Sensitive      SENSITIVE<=1    CEFEPIME Value in next row Sensitive      SENSITIVE<=1    CEFAZOLIN Value in next row Sensitive      SENSITIVE<=4    CEFTRIAXONE Value in next row Sensitive      SENSITIVE<=1    CIPROFLOXACIN Value in next row Sensitive      SENSITIVE<=0.25    GENTAMICIN Value in next row Sensitive      SENSITIVE<=1    IMIPENEM Value in next row Sensitive      SENSITIVE<=0.25    TRIMETH/SULFA Value in next row Sensitive      SENSITIVE<=20    PIP/TAZO Value in next row  Sensitive      SENSITIVE<=4    * RARE ESCHERICHIA COLI   Methicillin resistant staphylococcus aureus  - MIC*    CEFOXITIN SCREEN Value in next row Resistant      POSITIVECEFOXITIN SCREEN - This test may be used to predict mecA-mediated oxacillin resistance, and it is based on the cefoxitin disk screen test.  The cefoxitin screen and oxacillin work in combination to determine the final interpretation reported for oxacillin.     CIPROFLOXACIN Value in next row Resistant      RESISTANT>=8    ERYTHROMYCIN Value in next row Resistant      RESISTANT>=8    GENTAMICIN Value in next row Sensitive      SENSITIVE<=0.5    OXACILLIN Value in next row Resistant      MODERATELY RESISTANT>=4    TETRACYCLINE Value in next row Sensitive      SENSITIVE<=1    VANCOMYCIN Value in next row Sensitive      SENSITIVE1    TRIMETH/SULFA Value in next row Sensitive      SENSITIVE<=10    CLINDAMYCIN Value in next row Resistant      RESISTANT>=8    LINEZOLID Value in next row Sensitive      SENSITIVE2    LEVOFLOXACIN Value in next row Resistant      RESISTANT>=8    Inducible Clindamycin Value in next row Sensitive      RESISTANT>=8    * RARE METHICILLIN RESISTANT STAPHYLOCOCCUS AUREUS    RADIOLOGY:  Mr Brain Wo Contrast  05/13/2015  CLINICAL DATA:  Slurred speech.  Generalized fatigue and weakness. EXAM: MRI HEAD WITHOUT CONTRAST TECHNIQUE: Multiplanar, multiecho pulse sequences of the brain and surrounding structures were obtained without intravenous contrast. COMPARISON:  CT head without contrast 05/08/2015. FINDINGS: No acute infarct, hemorrhage, or mass lesion is present. The ventricles are of normal size. No significant extraaxial fluid collection is present. No significant white matter disease is present. The internal auditory canals and brainstem normal. Flow is present in the major intracranial arteries. The globes and orbits are intact. Mucosal thickening is present at the floor of the maxillary sinuses bilaterally. There is some fluid in the right maxillary sinus. The paranasal sinuses are otherwise  clear. There is fluid in the mastoid air cells bilaterally. No obstructing nasopharyngeal lesion is evident. Skullbase is within normal limits. Midline structures are unremarkable. IMPRESSION: 1. Normal MRI of the brain for age. Electronically Signed   By: San Morelle M.D.   On: 05/13/2015 19:08    Management plans discussed with the patient, family and they are in agreement.  CODE STATUS:     Code Status Orders        Start     Ordered   05/06/15 2220  Full code   Continuous     05/06/15 2219      TOTAL TIME TAKING CARE OF THIS PATIENT: 35 minutes.    Loletha Grayer M.D on 05/14/2015 at 10:32 AM  Between 7am to 6pm - Pager - [phone]  After 6pm go to [email or web address] - password [email or web address] [facility]  [place] [facility]  Office  [phone]  CC: Primary care physician; Marinda Elk, MD

## 2015-05-14 NOTE — Op Note (Signed)
[facility] Gastroenterology Patient Name: Manuel Jimenez Procedure Date: 05/14/2015 8:15 AM MRN: [id number] Account #: [id number] Date of Birth: 01/16/1961 Admit Type: Inpatient Age: 54 Room: [facility] ENDO ROOM 4 Gender: Male Note Status: Finalized Procedure:         Upper GI endoscopy Indications:       Iron deficiency anemia secondary to chronic blood loss,                     liver cirrhosis Providers:         Lupita Dawn. Candace Cruise, MD Referring MD:      Precious Bard, MD (Referring MD) Medicines:         Monitored Anesthesia Care Complications:     No immediate complications. Procedure:         Pre-Anesthesia Assessment:                    - Prior to the procedure, a History and Physical was                     performed, and patient medications, allergies and                     sensitivities were reviewed. The patient's tolerance of                     previous anesthesia was reviewed.                    - The risks and benefits of the procedure and the sedation                     options and risks were discussed with the patient. All                     questions were answered and informed consent was obtained.                    - After reviewing the risks and benefits, the patient was                     deemed in satisfactory condition to undergo the procedure.                    After obtaining informed consent, the endoscope was passed                     under direct vision. Throughout the procedure, the                     patient's blood pressure, pulse, and oxygen saturations                     were monitored continuously. The Endoscope was introduced                     through the mouth, and advanced to the second part of                     duodenum. The upper GI endoscopy was accomplished without                     difficulty. The patient tolerated the procedure well. Findings:      A benign-appearing, intrinsic mild stenosis was found  at the     gastroesophageal junction. A TTS dilator was passed through the scope.       Dilation with an 18-19-20 mm balloon (to a maximum balloon size of 18       mm) dilator was performed.      The exam was otherwise without abnormality.      Mild to Moderate portal hypertensive gastropathy was found in the       gastric body.      The exam was otherwise without abnormality.      The examined duodenum was normal. Impression:        - Benign-appearing esophageal stricture. Dilated.                    - The examination was otherwise normal.                    - Portal hypertensive gastropathy.                    - The examination was otherwise normal.                    - Normal examined duodenum.                    - No specimens collected. Recommendation:    - Observe patient's clinical course.                    - Continue present medications.                    - The findings and recommendations were discussed with the                     patient. Procedure Code(s): --- Professional ---                    [phone], Esophagogastroduodenoscopy, flexible, transoral;                     with transendoscopic balloon dilation of esophagus (less                     than 30 mm diameter) Diagnosis Code(s): --- Professional ---                    K22.2, Esophageal obstruction                    K76.6, Portal hypertension                    K31.89, Other diseases of stomach and duodenum                    D50.0, Iron deficiency anemia secondary to blood loss                     (chronic) CPT copyright 2014 [employer]. All rights reserved. The codes documented in this report are preliminary and upon coder review may  be revised to meet current compliance requirements. Hulen Luster, MD 05/14/2015 8:36:17 AM This report has been signed electronically. Number of Addenda: 0 Note Initiated On: 05/14/2015 8:15 AM      [facility]

## 2015-05-20 ENCOUNTER — Encounter: Payer: Self-pay | Admitting: Gastroenterology

## 2015-05-28 ENCOUNTER — Other Ambulatory Visit (HOSPITAL_COMMUNITY): Payer: Self-pay | Admitting: Physician Assistant

## 2015-05-28 ENCOUNTER — Other Ambulatory Visit: Payer: Self-pay | Admitting: Physician Assistant

## 2015-05-28 DIAGNOSIS — R160 Hepatomegaly, not elsewhere classified: Secondary | ICD-10-CM

## 2015-06-02 ENCOUNTER — Ambulatory Visit
Admission: RE | Admit: 2015-06-02 | Discharge: 2015-06-02 | Disposition: A | Discharge status: Home / Self Care | Payer: MEDICAID | Source: Ambulatory Visit | Attending: Physician Assistant | Admitting: Physician Assistant

## 2015-06-02 DIAGNOSIS — R160 Hepatomegaly, not elsewhere classified: Secondary | ICD-10-CM

## 2015-06-02 DIAGNOSIS — I878 Other specified disorders of veins: Secondary | ICD-10-CM | POA: Insufficient documentation

## 2015-06-02 DIAGNOSIS — R161 Splenomegaly, not elsewhere classified: Secondary | ICD-10-CM | POA: Insufficient documentation

## 2015-06-02 DIAGNOSIS — I81 Portal vein thrombosis: Secondary | ICD-10-CM | POA: Insufficient documentation

## 2015-06-02 MED ORDER — IOHEXOL 350 MG/ML SOLN
100.0000 mL | Freq: Once | INTRAVENOUS | Status: AC | PRN
  Administered 2015-06-02: 100 mL via INTRAVENOUS

## 2015-06-05 ENCOUNTER — Inpatient Hospital Stay: Payer: Self-pay | Attending: Oncology | Admitting: Oncology

## 2015-06-05 ENCOUNTER — Encounter: Payer: Self-pay | Admitting: Oncology

## 2015-06-05 VITALS — BP 162/93 | HR 112 | Temp 96.9°F | Ht 71.0 in | Wt 136.0 lb

## 2015-06-05 DIAGNOSIS — Z79899 Other long term (current) drug therapy: Secondary | ICD-10-CM | POA: Insufficient documentation

## 2015-06-05 DIAGNOSIS — F17210 Nicotine dependence, cigarettes, uncomplicated: Secondary | ICD-10-CM | POA: Insufficient documentation

## 2015-06-05 DIAGNOSIS — R161 Splenomegaly, not elsewhere classified: Secondary | ICD-10-CM | POA: Insufficient documentation

## 2015-06-05 DIAGNOSIS — I81 Portal vein thrombosis: Secondary | ICD-10-CM | POA: Insufficient documentation

## 2015-06-05 DIAGNOSIS — K7460 Unspecified cirrhosis of liver: Secondary | ICD-10-CM | POA: Insufficient documentation

## 2015-06-05 DIAGNOSIS — R160 Hepatomegaly, not elsewhere classified: Secondary | ICD-10-CM | POA: Insufficient documentation

## 2015-06-05 DIAGNOSIS — Z7289 Other problems related to lifestyle: Secondary | ICD-10-CM | POA: Insufficient documentation

## 2015-06-05 DIAGNOSIS — D649 Anemia, unspecified: Secondary | ICD-10-CM | POA: Insufficient documentation

## 2015-06-05 DIAGNOSIS — I10 Essential (primary) hypertension: Secondary | ICD-10-CM | POA: Insufficient documentation

## 2015-06-05 LAB — COMPREHENSIVE METABOLIC PANEL
ALBUMIN: 3.4 g/dL — AB (ref 3.5–5.0)
ALT: 33 U/L (ref 17–63)
ANION GAP: 10 (ref 5–15)
AST: 39 U/L (ref 15–41)
Alkaline Phosphatase: 186 U/L — ABNORMAL HIGH (ref 38–126)
BILIRUBIN TOTAL: 0.4 mg/dL (ref 0.3–1.2)
BUN: 9 mg/dL (ref 6–20)
CHLORIDE: 104 mmol/L (ref 101–111)
CO2: 24 mmol/L (ref 22–32)
Calcium: 9.3 mg/dL (ref 8.9–10.3)
Creatinine, Ser: 0.44 mg/dL — ABNORMAL LOW (ref 0.61–1.24)
GFR calc Af Amer: >60 mL/min (ref >60)
GFR calc non Af Amer: >60 mL/min (ref >60)
GLUCOSE: 108 mg/dL — AB (ref 65–99)
POTASSIUM: 3.5 mmol/L (ref 3.5–5.1)
Sodium: 138 mmol/L (ref 135–145)
TOTAL PROTEIN: 7.7 g/dL (ref 6.5–8.1)

## 2015-06-05 LAB — CBC WITH DIFFERENTIAL/PLATELET
BASOS ABS: 0.1 10*3/uL (ref 0–0.1)
BASOS PCT: 1 %
EOS ABS: 0.1 10*3/uL (ref 0–0.7)
EOS PCT: 1 %
HEMATOCRIT: 37.1 % — AB (ref 40.0–52.0)
Hemoglobin: 12.1 g/dL — ABNORMAL LOW (ref 13.0–18.0)
Lymphocytes Relative: 14 %
Lymphs Abs: 1.4 10*3/uL (ref 1.0–3.6)
MCH: 27.5 pg (ref 26.0–34.0)
MCHC: 32.6 g/dL (ref 32.0–36.0)
MCV: 84.4 fL (ref 80.0–100.0)
MONO ABS: 0.7 10*3/uL (ref 0.2–1.0)
Monocytes Relative: 7 %
NEUTROS ABS: 8.1 10*3/uL — AB (ref 1.4–6.5)
Neutrophils Relative %: 77 %
PLATELETS: 220 10*3/uL (ref 150–440)
RBC: 4.39 MIL/uL — ABNORMAL LOW (ref 4.40–5.90)
RDW: 20.9 % — AB (ref 11.5–14.5)
WBC: 10.6 10*3/uL (ref 3.8–10.6)

## 2015-06-05 LAB — PROTIME-INR
INR: 0.98
PROTHROMBIN TIME: 13 s (ref 11.4–15.0)

## 2015-06-05 LAB — APTT: APTT: 26 s (ref 24–36)

## 2015-06-06 LAB — CANCER ANTIGEN 19-9: CA 19-9: 7 U/mL (ref 0–35)

## 2015-06-06 LAB — AFP TUMOR MARKER: AFP-Tumor Marker: 3.3 ng/mL (ref 0.0–8.3)

## 2015-06-06 LAB — CEA: CEA: 2.2 ng/mL (ref 0.0–4.7)

## 2015-06-11 ENCOUNTER — Ambulatory Visit (HOSPITAL_COMMUNITY)
Admission: RE | Admit: 2015-06-11 | Discharge: 2015-06-11 | Disposition: A | Discharge status: Home / Self Care | Payer: MEDICAID | Source: Ambulatory Visit | Attending: Oncology | Admitting: Oncology

## 2015-06-11 DIAGNOSIS — K7460 Unspecified cirrhosis of liver: Secondary | ICD-10-CM | POA: Insufficient documentation

## 2015-06-11 DIAGNOSIS — R160 Hepatomegaly, not elsewhere classified: Secondary | ICD-10-CM

## 2015-06-11 DIAGNOSIS — K769 Liver disease, unspecified: Secondary | ICD-10-CM | POA: Insufficient documentation

## 2015-06-11 DIAGNOSIS — I81 Portal vein thrombosis: Secondary | ICD-10-CM | POA: Insufficient documentation

## 2015-06-11 MED ORDER — GADOBENATE DIMEGLUMINE 529 MG/ML IV SOLN
7.0000 mL | Freq: Once | INTRAVENOUS | Status: AC | PRN
Start: 2015-06-11 — End: 2015-06-11
  Administered 2015-06-11: 7 mL via INTRAVENOUS

## 2015-06-12 ENCOUNTER — Inpatient Hospital Stay (HOSPITAL_BASED_OUTPATIENT_CLINIC_OR_DEPARTMENT_OTHER): Payer: Self-pay | Admitting: Oncology

## 2015-06-12 VITALS — BP 163/89 | HR 111 | Temp 98.2°F | Resp 16 | Wt 136.9 lb

## 2015-06-12 DIAGNOSIS — F17210 Nicotine dependence, cigarettes, uncomplicated: Secondary | ICD-10-CM

## 2015-06-12 DIAGNOSIS — R161 Splenomegaly, not elsewhere classified: Secondary | ICD-10-CM

## 2015-06-12 DIAGNOSIS — D649 Anemia, unspecified: Secondary | ICD-10-CM

## 2015-06-12 DIAGNOSIS — K7460 Unspecified cirrhosis of liver: Secondary | ICD-10-CM

## 2015-06-12 DIAGNOSIS — I81 Portal vein thrombosis: Secondary | ICD-10-CM

## 2015-06-12 DIAGNOSIS — Z7289 Other problems related to lifestyle: Secondary | ICD-10-CM

## 2015-06-12 DIAGNOSIS — Z79899 Other long term (current) drug therapy: Secondary | ICD-10-CM

## 2015-06-12 DIAGNOSIS — R160 Hepatomegaly, not elsewhere classified: Secondary | ICD-10-CM

## 2015-06-12 NOTE — Progress Notes (Signed)
Here to discuss results and offers no concerns or problems.

## 2015-06-12 NOTE — Progress Notes (Signed)
[place]  [phone] [phone]  ID: Manuel Jimenez OB: January 24, 1961  MR#: [id number]  [id number]  Patient Care Team: Marinda Elk, MD as PCP - General (Physician Assistant)  CHIEF COMPLAINT:  Chief Complaint  Patient presents with  . New Patient (Initial Visit)    liver mass    INTERVAL HISTORY: Patient is a 54 year old male who was recently admitted to the hospital with severe anemia from GI bleed requiring 3 units of packed red blood cells.  EGD revealed portal gastropathy.  He has a reported history of heavy alcohol and NSAID use. Ultrasound revealed a suspicious liver lesion and portal vein thrombosis. Patient initially refused a CT scan, but eventually had one completed as an outpatient on June 02, 2015.  He currently feels well and is nearly back to his baseline.  He has no further bleeding.  He denies any fevers. He has no neurologic complaints.  He denies and chest pain or shortness of breath.  He denies any nausea, vomiting, constipation, or diarrhea.  He has no melena or hematochezia.  He denies any urinary complaints.  He offers no further specific complaints today.  REVIEW OF SYSTEMS:   Review of Systems  Constitutional: Positive for malaise/fatigue. Negative for fever and weight loss.  Respiratory: Negative.   Cardiovascular: Negative.   Gastrointestinal: Negative for abdominal pain, blood in stool and melena.  Musculoskeletal: Negative.   Neurological: Positive for weakness.  Endo/Heme/Allergies: Does not bruise/bleed easily.  Psychiatric/Behavioral: Positive for substance abuse.    As per HPI. Otherwise, a complete review of systems is negatve.  PAST MEDICAL HISTORY: Past Medical History  Diagnosis Date  . Hypertension     PAST SURGICAL HISTORY: Past Surgical History  Procedure Laterality Date  . Esophagogastroduodenoscopy N/A 05/14/2015    Procedure: ESOPHAGOGASTRODUODENOSCOPY (EGD);  Surgeon: Hulen Luster,  MD;  Location: [facility] ENDOSCOPY;  Service: Endoscopy;  Laterality: N/A;    FAMILY HISTORY Family History  Problem Relation Age of Onset  . CAD Father        ADVANCED DIRECTIVES:    HEALTH MAINTENANCE: Social History  Substance Use Topics  . Smoking status: Current Every Day Smoker -- 1.00 packs/day for 30 years    Types: Cigarettes  . Smokeless tobacco: Never Used  . Alcohol Use: Yes     Comment: occ     Colonoscopy:  PAP:  Bone density:  Lipid panel:  No Known Allergies  Current Outpatient Prescriptions  Medication Sig Dispense Refill  . feeding supplement, ENSURE ENLIVE, (ENSURE ENLIVE) LIQD Take 237 mLs by mouth 3 (three) times daily with meals. 90 Bottle 12  . ferrous sulfate 325 (65 FE) MG tablet Take 1 tablet (325 mg total) by mouth daily with breakfast. 30 tablet 0  . magnesium oxide (MAG-OX) 400 (241.3 MG) MG tablet Take 1 tablet (400 mg total) by mouth daily. 30 tablet 0  . omeprazole (PRILOSEC) 20 MG capsule Take 1 capsule (20 mg total) by mouth daily. 30 capsule 0  . doxycycline (VIBRA-TABS) 100 MG tablet Take 1 tablet (100 mg total) by mouth every 12 (twelve) hours. (Patient not taking: Reported on 06/05/2015) 18 tablet 0   No current facility-administered medications for this visit.    OBJECTIVE: Filed Vitals:   06/05/15 1350  BP:   Pulse: 112  Temp:      Body mass index is 18.98 kg/(m^2).    ECOG FS:0 - Asymptomatic  General: Well-developed, well-nourished, no acute distress. Eyes: Pink conjunctiva, anicteric  sclera. HEENT: Normocephalic, moist mucous membranes, clear oropharnyx. Lungs: Clear to auscultation bilaterally. Heart: Regular rate and rhythm. No rubs, murmurs, or gallops. Abdomen: Soft, nontender, nondistended. No organomegaly noted, normoactive bowel sounds. Musculoskeletal: No edema, cyanosis, or clubbing. Neuro: Alert, answering all questions appropriately. Cranial nerves grossly intact. Skin: No rashes or petechiae noted. Psych:  Normal affect. Lymphatics: No cervical, calvicular, axillary or inguinal LAD.   LAB RESULTS:  Lab Results  Component Value Date   NA 138 06/05/2015   K 3.5 06/05/2015   CL 104 06/05/2015   CO2 24 06/05/2015   GLUCOSE 108* 06/05/2015   BUN 9 06/05/2015   CREATININE 0.44* 06/05/2015   CALCIUM 9.3 06/05/2015   PROT 7.7 06/05/2015   ALBUMIN 3.4* 06/05/2015   AST 39 06/05/2015   ALT 33 06/05/2015   ALKPHOS 186* 06/05/2015   BILITOT 0.4 06/05/2015   GFRNONAA >60 06/05/2015   GFRAA >60 06/05/2015    Lab Results  Component Value Date   WBC 10.6 06/05/2015   NEUTROABS 8.1* 06/05/2015   HGB 12.1* 06/05/2015   HCT 37.1* 06/05/2015   MCV 84.4 06/05/2015   PLT 220 06/05/2015     STUDIES: Mr Brain Wo Contrast  05/13/2015  CLINICAL DATA:  Slurred speech.  Generalized fatigue and weakness. EXAM: MRI HEAD WITHOUT CONTRAST TECHNIQUE: Multiplanar, multiecho pulse sequences of the brain and surrounding structures were obtained without intravenous contrast. COMPARISON:  CT head without contrast 05/08/2015. FINDINGS: No acute infarct, hemorrhage, or mass lesion is present. The ventricles are of normal size. No significant extraaxial fluid collection is present. No significant white matter disease is present. The internal auditory canals and brainstem normal. Flow is present in the major intracranial arteries. The globes and orbits are intact. Mucosal thickening is present at the floor of the maxillary sinuses bilaterally. There is some fluid in the right maxillary sinus. The paranasal sinuses are otherwise clear. There is fluid in the mastoid air cells bilaterally. No obstructing nasopharyngeal lesion is evident. Skullbase is within normal limits. Midline structures are unremarkable. IMPRESSION: 1. Normal MRI of the brain for age. Electronically Signed   By: San Morelle M.D.   On: 05/13/2015 19:08   Mr Liver W Wo Contrast  06/11/2015  CLINICAL DATA:  Liver lesion.  Portal vein  thrombosis. EXAM: MRI ABDOMEN WITHOUT AND WITH CONTRAST TECHNIQUE: Multiplanar multisequence MR imaging of the abdomen was performed both before and after the administration of intravenous contrast. CONTRAST:  86mL MULTIHANCE GADOBENATE DIMEGLUMINE 529 MG/ML IV SOLN COMPARISON:  CT of 06/02/2015 FINDINGS: Mild motion degradation throughout. Lower chest: Normal heart size without pericardial or pleural effusion. Hepatobiliary: Mild cirrhosis, as evidenced by caudate lobe enlargement and irregular hepatic capsule. Scattered small T2 hyperintense cysts. No suspicious liver lesion. There is vague portal venous phase hypo intensity within the lateral segment left liver lobe, corresponding to the CT abnormality (image 22, series 1,102). This is favored to be related to altered perfusion and/or focal steatosis. Normal gallbladder, without biliary ductal dilatation. Pancreas: Normal, without mass or ductal dilatation. Spleen: Borderline enlarged, 12 cm craniocaudal. Adrenals/Urinary Tract: Normal adrenal glands. Normal kidneys, without hydronephrosis. Stomach/Bowel: Gastric underdistention. Other abdominal bowel loops within normal limits. Vascular/Lymphatic: Extensive aortic atherosclerosis with ulcerative plaque throughout the lower thoracic and abdominal aorta. Celiac ectasia is mild at 11 mm (image 30, series 1101.) no splenic artery aneurysm. Hepatic veins patent. Similar distribution of nonocclusive thrombus within the right and main portal veins. Series 11002. The superior mesenteric vein demonstrates occlusive thrombus, similar. Prominent porta  hepatis nodes which are likely reactive Other: No ascites. Upper abdominal edema is likely related to portal vein thrombus. Musculoskeletal: No acute osseous abnormality. IMPRESSION: 1. Mild cirrhosis, without suspicious liver lesion. Lateral segment left liver lobe abnormality on CT was likely due to altered perfusion and possibly focal steatosis. 2. Nonocclusive thrombus in  the right and main portal veins. Occlusive thrombus in the superior mesenteric vein. Similar. 3. Gastric underdistention. Apparent wall thickening could be secondary. Given similar appearance on prior CT, gastritis cannot be excluded. Electronically Signed   By: Abigail Miyamoto M.D.   On: 06/11/2015 17:13   Ct Abd Wo & W Cm  06/02/2015  CLINICAL DATA:  Epigastric pain. Portal vein thrombosis and possible liver mass seen on recent ultrasound. EXAM: CT ABDOMEN WITHOUT AND WITH CONTRAST TECHNIQUE: Multidetector CT imaging of the abdomen was performed following the standard protocol before and following the bolus administration of intravenous contrast. CONTRAST:  190mL OMNIPAQUE IOHEXOL 350 MG/ML SOLN COMPARISON:  Ultrasound on 05/09/2015 FINDINGS: Lower chest: No acute findings. Hepatobiliary: Hepatomegaly is demonstrated. Nonocclusive thrombus is seen within the main portal vein and proximal right and left portal veins. Heterogeneous enhancement the liver is seen due to altered perfusion which limits evaluation of hepatic parenchyma. There are several subtle poorly defined low-attenuation lesions seen in the lateral segment left hepatic lobe best visualized during portal venous phase. Index lesion measures 2.5 cm on image 59/series 6. This could be due to abscess or neoplasm. Recanalization of periumbilical veins is seen, consistent with portal venous hypertension. Pancreas: No masses, inflammatory changes, or fluid collections demonstrated. Spleen: Mild splenomegaly, also consistent with portal venous hypertension. Adrenal Glands:  No masses identified. Kidneys: No renal masses identified. No evidence of hydronephrosis. Stomach/Bowel/Peritoneum: Visualized abdominal bowel loops show diverticulosis and associated colonic wall thickening involving the ascending colon and cecum, suspicious for diverticulitis. Vascular/Lymphatic: In addition to portal vein thrombosis, there is probable superior mesenteric vein  thrombosis. Shotty mesenteric lymph nodes are seen mainly in the right lower quadrant. Other: Poorly defined soft tissue density is seen in the right lower quadrant mesentery which measures 2.8 x 4.3 cm on images 128 and 133 of series 4. A small central fluid and gas collection is seen within this soft tissue density. Differential diagnosis includes necrotic neoplasm or lymphadenopathy, and inflammatory mass. Given proximity to the cecum, appendicitis cannot be excluded. Musculoskeletal:  No suspicious bone lesions identified. 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 Splenomegaly and recanalization of periumbilical veins, consistent with portal venous hypertension. Hepatomegaly, with several subtle ill-defined lesions in the lateral segment of the left hepatic lobe measuring up to 2.5 cm. Differential diagnosis includes abscess and neoplasm/metastatic disease. Findings suspicious for right colonic diverticulitis. Ill-defined soft tissue density with central fluid and gas collection within the right lower quadrant mesentery. Differential diagnosis includes neoplasm, lymphadenopathy, and inflammatory mass. Given proximity to the cecum, appendicitis cannot be excluded. Recommend clinical correlation, and consider pelvis CT with contrast for further evaluation. These results will be called to the ordering clinician or representative by the Radiologist Assistant, and communication documented in the PACS or zVision Dashboard. Electronically Signed   By: Earle Gell M.D.   On: 06/02/2015 09:58    ASSESSMENT: Cirrhosis, port vein thrombosis.  PLAN:    1. Liver mass: MRI results reviewed independently and reported as above with no obvious evidence of liver mass.  His AFP, CEA, and CA 19-9 are also negative.  Given is heavy alcohol use and cirrhosis, he  is at risk for [facility].  No further intervention is needed at this time.  No further imaging is necessary unless  patient becomes symptomatic.  Return to clinic in 1 week to discuss his imaging results. 2. Portal vein thrombosis:  Given patient's recent GI bleed requiring 3 units of packed red blood cells, anticoagulation is not recommended.  Monitor. 3. Anemia: Secondary to GI bleed.  Hemoglobin nearly within normal limits. No further intervention is needed.  Patient expressed understanding and was in agreement with this plan. He also understands that He can call clinic at any time with any questions, concerns, or complaints.    Lloyd Huger, MD   06/12/2015 6:58 AM

## 2015-06-15 ENCOUNTER — Encounter: Payer: Self-pay | Admitting: *Deleted

## 2015-06-15 NOTE — Discharge Instructions (Signed)

## 2015-06-17 ENCOUNTER — Encounter
Admission: RE | Disposition: A | Discharge status: Home / Self Care | Payer: Self-pay | Source: Ambulatory Visit | Attending: Gastroenterology

## 2015-06-17 ENCOUNTER — Ambulatory Visit
Admission: RE | Admit: 2015-06-17 | Discharge: 2015-06-17 | Disposition: A | Discharge status: Home / Self Care | Payer: Self-pay | Source: Ambulatory Visit | Attending: Gastroenterology | Admitting: Gastroenterology

## 2015-06-17 ENCOUNTER — Ambulatory Visit: Payer: Self-pay | Admitting: Anesthesiology

## 2015-06-17 DIAGNOSIS — Z79899 Other long term (current) drug therapy: Secondary | ICD-10-CM | POA: Insufficient documentation

## 2015-06-17 DIAGNOSIS — D122 Benign neoplasm of ascending colon: Secondary | ICD-10-CM | POA: Insufficient documentation

## 2015-06-17 DIAGNOSIS — D509 Iron deficiency anemia, unspecified: Secondary | ICD-10-CM | POA: Insufficient documentation

## 2015-06-17 DIAGNOSIS — R933 Abnormal findings on diagnostic imaging of other parts of digestive tract: Secondary | ICD-10-CM | POA: Insufficient documentation

## 2015-06-17 DIAGNOSIS — D125 Benign neoplasm of sigmoid colon: Secondary | ICD-10-CM | POA: Insufficient documentation

## 2015-06-17 DIAGNOSIS — K7460 Unspecified cirrhosis of liver: Secondary | ICD-10-CM | POA: Insufficient documentation

## 2015-06-17 DIAGNOSIS — D124 Benign neoplasm of descending colon: Secondary | ICD-10-CM | POA: Insufficient documentation

## 2015-06-17 DIAGNOSIS — K5730 Diverticulosis of large intestine without perforation or abscess without bleeding: Secondary | ICD-10-CM | POA: Insufficient documentation

## 2015-06-17 HISTORY — DX: Portal vein thrombosis: I81

## 2015-06-17 HISTORY — DX: Anemia, unspecified: D64.9

## 2015-06-17 HISTORY — PX: COLONOSCOPY: SHX5424

## 2015-06-17 SURGERY — COLONOSCOPY
Anesthesia: Monitor Anesthesia Care

## 2015-06-17 MED ORDER — SODIUM CHLORIDE 0.9 % IV SOLN: INTRAVENOUS | Status: DC

## 2015-06-17 MED ORDER — STERILE WATER FOR IRRIGATION IR SOLN
Status: DC | PRN
  Administered 2015-06-17: 10:00:00

## 2015-06-17 MED ORDER — ACETAMINOPHEN 160 MG/5ML PO SOLN: 325.0000 mg | ORAL | Status: DC | PRN

## 2015-06-17 MED ORDER — PROPOFOL 10 MG/ML IV BOLUS
INTRAVENOUS | Status: DC | PRN
  Administered 2015-06-17: 20 mg via INTRAVENOUS
  Administered 2015-06-17: 30 mg via INTRAVENOUS
  Administered 2015-06-17: 50 mg via INTRAVENOUS
  Administered 2015-06-17: 20 mg via INTRAVENOUS
  Administered 2015-06-17: 40 mg via INTRAVENOUS
  Administered 2015-06-17: 30 mg via INTRAVENOUS
  Administered 2015-06-17: 100 mg via INTRAVENOUS
  Administered 2015-06-17: 20 mg via INTRAVENOUS
  Administered 2015-06-17: 40 mg via INTRAVENOUS

## 2015-06-17 MED ORDER — LIDOCAINE HCL (CARDIAC) 20 MG/ML IV SOLN
INTRAVENOUS | Status: DC | PRN
  Administered 2015-06-17: 40 mg via INTRAVENOUS

## 2015-06-17 MED ORDER — ACETAMINOPHEN 325 MG PO TABS: 325.0000 mg | ORAL_TABLET | ORAL | Status: DC | PRN

## 2015-06-17 MED ORDER — LACTATED RINGERS IV SOLN
INTRAVENOUS | Status: DC
  Administered 2015-06-17: 10:00:00 via INTRAVENOUS

## 2015-06-17 SURGICAL SUPPLY — 30 items
CANISTER SUCT 1200ML W/VALVE (MISCELLANEOUS) ×2 IMPLANT
FCP ESCP3.2XJMB 240X2.8X (MISCELLANEOUS)
FORCEPS BIOP RAD 4 LRG CAP 4 (CUTTING FORCEPS) IMPLANT
FORCEPS BIOP RJ4 240 W/NDL (MISCELLANEOUS)
FORCEPS ESCP3.2XJMB 240X2.8X (MISCELLANEOUS) IMPLANT
GOWN CVR UNV OPN BCK APRN NK (MISCELLANEOUS) ×1 IMPLANT
GOWN ISOL THUMB LOOP REG UNIV (MISCELLANEOUS) ×1
GOWN STRL REUS W/ TWL LRG LVL3 (GOWN DISPOSABLE) ×1 IMPLANT
GOWN STRL REUS W/TWL LRG LVL3 (GOWN DISPOSABLE) ×1
HEMOCLIP INSTINCT (CLIP) IMPLANT
INJECTOR VARIJECT VIN23 (MISCELLANEOUS) IMPLANT
KIT CO2 TUBING (TUBING) IMPLANT
KIT DEFENDO VALVE AND CONN (KITS) IMPLANT
KIT ENDO PROCEDURE OLY (KITS) ×2 IMPLANT
LIGATOR MULTIBAND 6SHOOTER MBL (MISCELLANEOUS) IMPLANT
MARKER SPOT ENDO TATTOO 5ML (MISCELLANEOUS) IMPLANT
PAD GROUND ADULT SPLIT (MISCELLANEOUS) IMPLANT
SNARE SHORT THROW 13M SML OVAL (MISCELLANEOUS) IMPLANT
SNARE SHORT THROW 30M LRG OVAL (MISCELLANEOUS) ×2 IMPLANT
SPOT EX ENDOSCOPIC TATTOO (MISCELLANEOUS)
SUCTION POLY TRAP 4CHAMBER (MISCELLANEOUS) IMPLANT
TRAP SUCTION POLY (MISCELLANEOUS) IMPLANT
TUBING CONN 6MMX3.1M (TUBING)
TUBING SUCTION CONN 0.25 STRL (TUBING) IMPLANT
UNDERPAD 30X60 958B10 (PK) (MISCELLANEOUS) IMPLANT
VALVE BIOPSY ENDO (VALVE) IMPLANT
VARIJECT INJECTOR VIN23 (MISCELLANEOUS)
WATER AUXILLARY (MISCELLANEOUS) IMPLANT
WATER STERILE IRR 250ML POUR (IV SOLUTION) IMPLANT
WATER STERILE IRR 500ML POUR (IV SOLUTION) IMPLANT

## 2015-06-17 NOTE — Transfer of Care (Signed)
Immediate Anesthesia Transfer of Care Note  Patient: Manuel Jimenez  Procedure(s) Performed: Procedure(s): COLONOSCOPY (N/A)  Patient Location: PACU  Anesthesia Type: MAC  Level of Consciousness: awake, alert  and patient cooperative  Airway and Oxygen Therapy: Patient Spontanous Breathing and Patient connected to supplemental oxygen  Post-op Assessment: Post-op Vital signs reviewed, Patient's Cardiovascular Status Stable, Respiratory Function Stable, Patent Airway and No signs of Nausea or vomiting  Post-op Vital Signs: Reviewed and stable  Complications: No apparent anesthesia complications

## 2015-06-17 NOTE — Anesthesia Preprocedure Evaluation (Signed)
Anesthesia Evaluation  Patient identified by MRN, date of birth, ID band  Reviewed: Allergy & Precautions, H&P , NPO status , Patient's Chart, lab work & pertinent test results  Airway Mallampati: II  TM Distance: >3 FB Neck ROM: full    Dental no notable dental hx.    Pulmonary Current Smoker,    Pulmonary exam normal        Cardiovascular hypertension,  Rhythm:regular Rate:Normal     Neuro/Psych    GI/Hepatic GERD  ,  Endo/Other    Renal/GU      Musculoskeletal   Abdominal   Peds  Hematology   Anesthesia Other Findings   Reproductive/Obstetrics                             Anesthesia Physical Anesthesia Plan  ASA: II  Anesthesia Plan: MAC   Post-op Pain Management:    Induction:   Airway Management Planned:   Additional Equipment:   Intra-op Plan:   Post-operative Plan:   Informed Consent: I have reviewed the patients History and Physical, chart, labs and discussed the procedure including the risks, benefits and alternatives for the proposed anesthesia with the patient or authorized representative who has indicated his/her understanding and acceptance.     Plan Discussed with: CRNA  Anesthesia Plan Comments:         Anesthesia Quick Evaluation

## 2015-06-17 NOTE — Anesthesia Procedure Notes (Signed)
Procedure Name: MAC Date/Time: 06/17/2015 10:02 AM Performed by: Georga Bora Pre-anesthesia Checklist: Patient identified, Emergency Drugs available, Suction available, Patient being monitored and Timeout performed Patient Re-evaluated:Patient Re-evaluated prior to inductionOxygen Delivery Method: Nasal cannula Preoxygenation: Pre-oxygenation with 100% oxygen

## 2015-06-17 NOTE — Anesthesia Postprocedure Evaluation (Signed)
  Anesthesia Post-op Note  Patient: Manuel Jimenez  Procedure(s) Performed: Procedure(s): COLONOSCOPY (N/A)  Anesthesia type:MAC  Patient location: PACU  Post pain: Pain level controlled  Post assessment: Post-op Vital signs reviewed, Patient's Cardiovascular Status Stable, Respiratory Function Stable, Patent Airway and No signs of Nausea or vomiting  Post vital signs: Reviewed and stable  Last Vitals:  Filed Vitals:   06/17/15 1037  BP:   Pulse:   Temp: 36.6 C  Resp:     Level of consciousness: awake, alert  and patient cooperative  Complications: No apparent anesthesia complications

## 2015-06-17 NOTE — H&P (Signed)
  Date of Initial H&P: 06/08/2015  History reviewed, patient examined, no change in status, stable for surgery. 

## 2015-06-17 NOTE — Op Note (Signed)
[facility] Patient Name: Manuel Jimenez Procedure Date: 06/17/2015 9:57 AM MRN: [id number] Account #: [id number] Date of Birth: 12/03/1960 Admit Type: Outpatient Age: 54 Room: [facility] OR ROOM 01 Gender: Male Note Status: Finalized Procedure:         Colonoscopy Indications:       Abnormal CT of the GI tract, , liver cirrhosis Providers:         Lupita Dawn. Candace Cruise, MD Referring MD:      Precious Bard, MD (Referring MD) Medicines:         Monitored Anesthesia Care Complications:     No immediate complications. Procedure:         Pre-Anesthesia Assessment:                    - Prior to the procedure, a History and Physical was                     performed, and patient medications, allergies and                     sensitivities were reviewed. The patient's tolerance of                     previous anesthesia was reviewed.                    - The risks and benefits of the procedure and the sedation                     options and risks were discussed with the patient. All                     questions were answered and informed consent was obtained.                    - After reviewing the risks and benefits, the patient was                     deemed in satisfactory condition to undergo the procedure.                    After obtaining informed consent, the colonoscope was                     passed under direct vision. Throughout the procedure, the                     patient's blood pressure, pulse, and oxygen saturations                     were monitored continuously. The Olympus CF-HQ190L                     Colonoscope (S#. [id number]) was introduced through the anus                     and advanced to the the terminal ileum, with                     identification of the appendiceal orifice and IC valve.                     The colonoscopy was performed without difficulty. The  patient tolerated the procedure well. The quality  of the                     bowel preparation was fair. Findings:      Multiple small and large-mouthed diverticula were found in the ascending       colon.      A small polyp was found in the ascending colon. The polyp was sessile.       The polyp was removed with a cold snare. Resection and retrieval were       complete.      A small polyp was found in the descending colon. The polyp was sessile.       The polyp was removed with a cold snare. Resection and retrieval were       complete.      A large polyp was found in the sigmoid colon. The polyp was sessile. The       polyp was removed with a hot snare. Resection and retrieval were       complete.      The exam was otherwise without abnormality.      This large polyp was at 30cm. Removed in piecemeal fashion. May need to       repeat CT later to make sure inflammatory mass resolved.      The exam was otherwise without abnormality.      The terminal ileum appeared normal. Impression:        - Diverticulosis in the ascending colon.                    - One small polyp in the ascending colon. Resected and                     retrieved.                    - One small polyp in the descending colon. Resected and                     retrieved.                    - One large polyp in the sigmoid colon. Resected and                     retrieved.                    - The examination was otherwise normal.                    - The examination was otherwise normal.                    - The examined portion of the ileum was normal. Recommendation:    - Discharge patient to home.                    - Await pathology results.                    - The findings and recommendations were discussed with the                     patient. Procedure Code(s): --- Professional ---                    [id number], Colonoscopy,  flexible; with removal of tumor(s),                     polyp(s), or other lesion(s) by snare technique Diagnosis Code(s): ---  Professional ---                    D12.2, Benign neoplasm of ascending colon                    D12.4, Benign neoplasm of descending colon                    D12.5, Benign neoplasm of sigmoid colon                    K57.30, Diverticulosis of large intestine without                     perforation or abscess without bleeding                    R93.3, Abnormal findings on diagnostic imaging of other                     parts of digestive tract CPT copyright 2014 [employer]. All rights reserved. The codes documented in this report are preliminary and upon coder review may  be revised to meet current compliance requirements. Hulen Luster, MD 06/17/2015 10:36:10 AM This report has been signed electronically. Number of Addenda: 0 Note Initiated On: 06/17/2015 9:57 AM Scope Withdrawal Time: 0 hours 15 minutes 53 seconds  Total Procedure Duration: 0 hours 20 minutes 54 seconds       [facility]

## 2015-06-18 ENCOUNTER — Encounter: Payer: Self-pay | Admitting: Gastroenterology

## 2015-06-19 LAB — SURGICAL PATHOLOGY

## 2015-06-29 NOTE — Progress Notes (Signed)
[place]  [phone] [phone]) [phone]  ID: [name] OB: 05/09/1961  MR#: [id number]  [id number]  Patient Care Team: Marinda Elk, MD as PCP - General (Physician Assistant)  CHIEF COMPLAINT:  Chief Complaint  Patient presents with  . liver mass    INTERVAL HISTORY: Patient returns to clinic today to discuss his MRI results. He continues to feel well and is asymptomatic.  He has no further bleeding.  He denies any fevers. He has no neurologic complaints.  He denies and chest pain or shortness of breath.  He denies any nausea, vomiting, constipation, or diarrhea.  He has no melena or hematochezia.  He denies any urinary complaints.  He offers no specific complaints today.  REVIEW OF SYSTEMS:   Review of Systems  Constitutional: Negative for fever, weight loss and malaise/fatigue.  Respiratory: Negative.   Cardiovascular: Negative.   Gastrointestinal: Negative for abdominal pain, blood in stool and melena.  Musculoskeletal: Negative.   Neurological: Negative for weakness.  Endo/Heme/Allergies: Does not bruise/bleed easily.  Psychiatric/Behavioral: Positive for substance abuse.    As per HPI. Otherwise, a complete review of systems is negatve.  PAST MEDICAL HISTORY: Past Medical History  Diagnosis Date  . Hypertension     in past  . Portal vein thrombosis     see chart review 06/05/15  . Anemia     PAST SURGICAL HISTORY: Past Surgical History  Procedure Laterality Date  . Esophagogastroduodenoscopy N/A 05/14/2015    Procedure: ESOPHAGOGASTRODUODENOSCOPY (EGD);  Surgeon: Hulen Luster, MD;  Location: [facility] ENDOSCOPY;  Service: Endoscopy;  Laterality: N/A;  . Colonoscopy N/A 06/17/2015    Procedure: COLONOSCOPY;  Surgeon: Hulen Luster, MD;  Location: [place];  Service: Gastroenterology;  Laterality: N/A;    FAMILY HISTORY Family History  Problem Relation Age of Onset  . CAD Father        ADVANCED DIRECTIVES:     HEALTH MAINTENANCE: Social History  Substance Use Topics  . Smoking status: Current Every Day Smoker -- 1.00 packs/day for 35 years    Types: Cigarettes  . Smokeless tobacco: Never Used  . Alcohol Use: No     Comment: no alcohol - several weeks     Colonoscopy:  PAP:  Bone density:  Lipid panel:  No Known Allergies  Current Outpatient Prescriptions  Medication Sig Dispense Refill  . doxycycline (VIBRA-TABS) 100 MG tablet Take 1 tablet (100 mg total) by mouth every 12 (twelve) hours. 18 tablet 0  . feeding supplement, ENSURE ENLIVE, (ENSURE ENLIVE) LIQD Take 237 mLs by mouth 3 (three) times daily with meals. 90 Bottle 12  . ferrous sulfate 325 (65 FE) MG tablet Take 1 tablet (325 mg total) by mouth daily with breakfast. 30 tablet 0  . magnesium oxide (MAG-OX) 400 (241.3 MG) MG tablet Take 1 tablet (400 mg total) by mouth daily. 30 tablet 0  . omeprazole (PRILOSEC) 20 MG capsule Take 1 capsule (20 mg total) by mouth daily. 30 capsule 0  . polyethylene glycol-electrolytes (NULYTELY/GOLYTELY) 420 G solution Take as directed for colonic prep.     No current facility-administered medications for this visit.    OBJECTIVE: Filed Vitals:   06/12/15 0926  BP: 163/89  Pulse: 111  Temp: 98.2 F (36.8 C)  Resp: 16     Body mass index is 19.1 kg/(m^2).    ECOG FS:0 - Asymptomatic  General: Well-developed, well-nourished, no acute distress. Eyes: Pink conjunctiva, anicteric sclera. Lungs: Clear to auscultation bilaterally. Heart:  Regular rate and rhythm. No rubs, murmurs, or gallops. Abdomen: Soft, nontender, nondistended. No organomegaly noted, normoactive bowel sounds. Musculoskeletal: No edema, cyanosis, or clubbing. Neuro: Alert, answering all questions appropriately. Cranial nerves grossly intact. Skin: No rashes or petechiae noted. Psych: Normal affect.   LAB RESULTS:  Lab Results  Component Value Date   NA 138 06/05/2015   K 3.5 06/05/2015   CL 104 06/05/2015    CO2 24 06/05/2015   GLUCOSE 108* 06/05/2015   BUN 9 06/05/2015   CREATININE 0.44* 06/05/2015   CALCIUM 9.3 06/05/2015   PROT 7.7 06/05/2015   ALBUMIN 3.4* 06/05/2015   AST 39 06/05/2015   ALT 33 06/05/2015   ALKPHOS 186* 06/05/2015   BILITOT 0.4 06/05/2015   GFRNONAA >60 06/05/2015   GFRAA >60 06/05/2015    Lab Results  Component Value Date   WBC 10.6 06/05/2015   NEUTROABS 8.1* 06/05/2015   HGB 12.1* 06/05/2015   HCT 37.1* 06/05/2015   MCV 84.4 06/05/2015   PLT 220 06/05/2015     STUDIES: Mr Liver W Wo Contrast  2015-07-02  CLINICAL DATA:  Liver lesion.  Portal vein thrombosis. EXAM: MRI ABDOMEN WITHOUT AND WITH CONTRAST TECHNIQUE: Multiplanar multisequence MR imaging of the abdomen was performed both before and after the administration of intravenous contrast. CONTRAST:  15mL MULTIHANCE GADOBENATE DIMEGLUMINE 529 MG/ML IV SOLN COMPARISON:  CT of 06/02/2015 FINDINGS: Mild motion degradation throughout. Lower chest: Normal heart size without pericardial or pleural effusion. Hepatobiliary: Mild cirrhosis, as evidenced by caudate lobe enlargement and irregular hepatic capsule. Scattered small T2 hyperintense cysts. No suspicious liver lesion. There is vague portal venous phase hypo intensity within the lateral segment left liver lobe, corresponding to the CT abnormality (image 22, series 1,102). This is favored to be related to altered perfusion and/or focal steatosis. Normal gallbladder, without biliary ductal dilatation. Pancreas: Normal, without mass or ductal dilatation. Spleen: Borderline enlarged, 12 cm craniocaudal. Adrenals/Urinary Tract: Normal adrenal glands. Normal kidneys, without hydronephrosis. Stomach/Bowel: Gastric underdistention. Other abdominal bowel loops within normal limits. Vascular/Lymphatic: Extensive aortic atherosclerosis with ulcerative plaque throughout the lower thoracic and abdominal aorta. Celiac ectasia is mild at 11 mm (image 30, series 1101.) no splenic  artery aneurysm. Hepatic veins patent. Similar distribution of nonocclusive thrombus within the right and main portal veins. Series 11002. The superior mesenteric vein demonstrates occlusive thrombus, similar. Prominent porta hepatis nodes which are likely reactive Other: No ascites. Upper abdominal edema is likely related to portal vein thrombus. Musculoskeletal: No acute osseous abnormality. IMPRESSION: 1. Mild cirrhosis, without suspicious liver lesion. Lateral segment left liver lobe abnormality on CT was likely due to altered perfusion and possibly focal steatosis. 2. Nonocclusive thrombus in the right and main portal veins. Occlusive thrombus in the superior mesenteric vein. Similar. 3. Gastric underdistention. Apparent wall thickening could be secondary. Given similar appearance on prior CT, gastritis cannot be excluded. Electronically Signed   By: Abigail Miyamoto M.D.   On: 2015-07-02 17:13   Ct Abd Wo & W Cm  06/02/2015  CLINICAL DATA:  Epigastric pain. Portal vein thrombosis and possible liver mass seen on recent ultrasound. EXAM: CT ABDOMEN WITHOUT AND WITH CONTRAST TECHNIQUE: Multidetector CT imaging of the abdomen was performed following the standard protocol before and following the bolus administration of intravenous contrast. CONTRAST:  159mL OMNIPAQUE IOHEXOL 350 MG/ML SOLN COMPARISON:  Ultrasound on 05/09/2015 FINDINGS: Lower chest: No acute findings. Hepatobiliary: Hepatomegaly is demonstrated. Nonocclusive thrombus is seen within the main portal vein and proximal right and left  portal veins. Heterogeneous enhancement the liver is seen due to altered perfusion which limits evaluation of hepatic parenchyma. There are several subtle poorly defined low-attenuation lesions seen in the lateral segment left hepatic lobe best visualized during portal venous phase. Index lesion measures 2.5 cm on image 59/series 6. This could be due to abscess or neoplasm. Recanalization of periumbilical veins is seen,  consistent with portal venous hypertension. Pancreas: No masses, inflammatory changes, or fluid collections demonstrated. Spleen: Mild splenomegaly, also consistent with portal venous hypertension. Adrenal Glands:  No masses identified. Kidneys: No renal masses identified. No evidence of hydronephrosis. Stomach/Bowel/Peritoneum: Visualized abdominal bowel loops show diverticulosis and associated colonic wall thickening involving the ascending colon and cecum, suspicious for diverticulitis. 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 Shotty mesenteric lymph nodes are seen mainly in the right lower quadrant. Other: Poorly defined soft tissue density is seen in the right lower quadrant mesentery which measures 2.8 x 4.3 cm on images 128 and 133 of series 4. A small central fluid and gas collection is seen within this soft tissue density. Differential diagnosis includes necrotic neoplasm or lymphadenopathy, and inflammatory mass. Given proximity to the cecum, appendicitis cannot be excluded. Musculoskeletal:  No suspicious bone lesions identified. 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 Splenomegaly and recanalization of periumbilical veins, consistent with portal venous hypertension. Hepatomegaly, with several subtle ill-defined lesions in the lateral segment of the left hepatic lobe measuring up to 2.5 cm. Differential diagnosis includes abscess and neoplasm/metastatic disease. Findings suspicious for right colonic diverticulitis. Ill-defined soft tissue density with central fluid and gas collection within the right lower quadrant mesentery. Differential diagnosis includes neoplasm, lymphadenopathy, and inflammatory mass. Given proximity to the cecum, appendicitis cannot be excluded. Recommend clinical correlation, and consider pelvis CT with contrast for further evaluation. These results will  be called to the ordering clinician or representative by the Radiologist Assistant, and communication documented in the PACS or zVision Dashboard. Electronically Signed   By: Earle Gell M.D.   On: 06/02/2015 09:58    ASSESSMENT: Cirrhosis, port vein thrombosis.  PLAN:    1. Liver mass: MRI results reviewed independently and reported as above with no obvious evidence of liver mass.  His AFP, CEA, and CA 19-9 are also negative.  Given is heavy alcohol use and cirrhosis, he is at risk for [facility].  No further intervention is needed at this time.  No further imaging is necessary unless patient becomes symptomatic.  No follow-up is necessary. 2. Portal vein thrombosis:  Given patient's recent GI bleed requiring 3 units of packed red blood cells, anticoagulation is not recommended.  Monitor. 3. Anemia: Secondary to GI bleed.  Hemoglobin nearly within normal limits. No further intervention is needed.  Patient expressed understanding and was in agreement with this plan. He also understands that He can call clinic at any time with any questions, concerns, or complaints.    Lloyd Huger, MD   06/29/2015 10:57 PM

## 2016-04-06 DIAGNOSIS — K5732 Diverticulitis of large intestine without perforation or abscess without bleeding: Secondary | ICD-10-CM | POA: Insufficient documentation

## 2016-04-06 DIAGNOSIS — K7030 Alcoholic cirrhosis of liver without ascites: Secondary | ICD-10-CM | POA: Insufficient documentation

## 2016-04-06 DIAGNOSIS — I81 Portal vein thrombosis: Secondary | ICD-10-CM | POA: Insufficient documentation

## 2016-04-18 ENCOUNTER — Encounter: Payer: Self-pay | Admitting: *Deleted

## 2016-04-18 ENCOUNTER — Other Ambulatory Visit: Payer: Self-pay | Admitting: Family Medicine

## 2016-04-18 ENCOUNTER — Ambulatory Visit (INDEPENDENT_AMBULATORY_CARE_PROVIDER_SITE_OTHER): Payer: Self-pay | Admitting: Family Medicine

## 2016-04-18 ENCOUNTER — Encounter: Payer: Self-pay | Admitting: Family Medicine

## 2016-04-18 VITALS — BP 152/79 | HR 80 | Temp 98.3°F | Resp 16 | Ht 71.0 in | Wt 123.0 lb

## 2016-04-18 DIAGNOSIS — I10 Essential (primary) hypertension: Secondary | ICD-10-CM | POA: Diagnosis not present

## 2016-04-18 DIAGNOSIS — K7030 Alcoholic cirrhosis of liver without ascites: Secondary | ICD-10-CM | POA: Diagnosis not present

## 2016-04-18 DIAGNOSIS — D500 Iron deficiency anemia secondary to blood loss (chronic): Secondary | ICD-10-CM | POA: Diagnosis not present

## 2016-04-18 DIAGNOSIS — R010 Benign and innocent cardiac murmurs: Secondary | ICD-10-CM | POA: Diagnosis not present

## 2016-04-18 DIAGNOSIS — I81 Portal vein thrombosis: Secondary | ICD-10-CM | POA: Diagnosis not present

## 2016-04-18 DIAGNOSIS — Z720 Tobacco use: Secondary | ICD-10-CM | POA: Diagnosis not present

## 2016-04-18 DIAGNOSIS — E43 Unspecified severe protein-calorie malnutrition: Secondary | ICD-10-CM

## 2016-04-18 DIAGNOSIS — E876 Hypokalemia: Secondary | ICD-10-CM

## 2016-04-18 DIAGNOSIS — K219 Gastro-esophageal reflux disease without esophagitis: Secondary | ICD-10-CM | POA: Insufficient documentation

## 2016-04-18 DIAGNOSIS — K5732 Diverticulitis of large intestine without perforation or abscess without bleeding: Secondary | ICD-10-CM

## 2016-04-18 DIAGNOSIS — R011 Cardiac murmur, unspecified: Secondary | ICD-10-CM | POA: Insufficient documentation

## 2016-04-18 NOTE — Assessment & Plan Note (Addendum)
Recent low Hgb to 8.3 with diverticulitis, in setting of chronic history of normocytic anemia, with prior baseline Hgb 12, previous significant low Hgb with hospitalization diverticular bleed 06/2015. - Check CBC today follow Hgb, may need repeat in 3-6 weeks after iron therapy - Suspect supplemental PO iron is causing abdominal cramping/discomfort symptoms, stop for few days, resume with food daily then inc to BID with food  UPDATE - Results of CBC with improved Hgb from 8.3 up to 9.9, still below baseline, Hct low, normal WBC. MCV 76 more consistent with microcytic anemia with chronic blood loss iron deficiency anemia, also suspect component of anemia of chronic disease 2/2 cirrhosis. Will add on labs Anemia Panel (iron, ferritin, TIBC, transferring, and possibly [id number] given alcoholic history).

## 2016-04-18 NOTE — Assessment & Plan Note (Signed)
Concern with low BMI 17 and low protein on chemistry in setting of alcoholic cirrhosis, prior difficulty with maintaining weight over past 1 yr after initial wt loss with acute illness last year, recent 2-3 weeks poor appetite due to abdominal cramping with iron tablets. Reassuring that no longer drinking alcohol Follow-up after taking iron with food titration, if still problems PO and poor weight gain, will refer to nutrition, may need supplement and MVI, thiamine, folic acid

## 2016-04-18 NOTE — Assessment & Plan Note (Signed)
Re-check chemistry, suspect electrolyte derangements were due to cirrhosis possibly acute bleeding and dehydration initially on hospitalization

## 2016-04-18 NOTE — Progress Notes (Addendum)
Subjective:    Patient ID: Manuel Jimenez, male    DOB: [birth year], 55 y.o.   MRN: [id number]  HIPOLITO Jimenez is a 55 y.o. 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 - Today requests referral to GI, recently seen at [facility] for diverticulitis and advised to f/u with [facility] Hepatology, however patient would like to see local GI doctor - Currently remains alcohol free for >1 year and 1 month, not following with AA or any therapy or treatment. Tries to avoid friends that associated with alcohol consumption to reduce triggers. When he quit he had tapered down on his own, no detox, but did get hospitalized for other problems, denies any formal dx DTs or seizures or complication - Previous heavy drinker 8-10 yrs, average 8-10 beers each weekday, weekend up to 24 beers  DIVERTICULITIS, follow-up recent acute flare / ANEMIA, iron loss: - Reports about 05/2015 hospitalized due to GIB with anemia and found to have diverticulitis, under went colonoscopy, last 07/2015 per Dr Candace Cruise, did find some polyps, advised f/u in 2 years, approx 2018. No longer has GI now and needs new referral - Recent acute hospitalization [facility] 04/07/2016 with acute diverticulitis flare with bleeding, completed Augmentin course, some question with darker stools concern for possible upper gastritis or PUD, now improved. He was using a lot of Goody powder daily, now stopped, started on Pepcid, tolerating well. Last Hemoglobin 8.3, started on ferrous sulfate BID - Today reports concerns with mid to lower abdominal cramping and bloating with gas, seems to attribute this only after leaving hospital and seems related to taking iron (was not taking with  food) - Denies any further rectal bleeding or melena, hematemesis, persistent abdominal pain  TOBACCO ABUSE: - Active smoker, 1-2 ppd currently. Quit on own last year during hospitalization with acute illness, then eventually resumed smoking. Never tried NRT or medications. Not ready to quit at this time.  Health Maintenance: - Discussed recommendation to get flu shot today, patient deferred at this time  Past Medical History:  Diagnosis Date  . Anemia   . Cirrhosis ([place])   . Colon polyps   . Hypertension    in past  . Portal vein thrombosis    see chart review 06/05/15  . Stomach ulcer    Social History   Social History  . Marital status: Single    Spouse name: N/A  . Number of children: N/A  . Years of education: High School   Occupational History  . Retired    Social History Main Topics  . Smoking status: Current Every Day Smoker    Packs/day: 1.00    Years: 35.00    Types: Cigarettes  . Smokeless tobacco: Never Used     Comment: Only successfully quit for 1 month before due to acute illness  . 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  . Drug use: No  . Sexual activity: Not on file   Other Topics Concern  . Not on file   Social History Narrative  . No narrative on file   Family History  Problem Relation Age of Onset  . CAD Father   . Heart attack Father  Current Outpatient Prescriptions on File Prior to Visit  Medication Sig  . ferrous sulfate 325 (65 FE) MG tablet Take 1 tablet (325 mg total) by mouth daily with breakfast.   No current facility-administered medications on file prior to visit.     Review of Systems  Constitutional: Positive for appetite change (recent reduced appetite since hospitalization 2 weeks ago). Negative for activity change, chills, diaphoresis, fatigue, fever and unexpected weight change (has not gained weight back from prior illness within 36yr ago).  HENT: Negative for congestion, hearing loss,  nosebleeds, sinus pressure, trouble swallowing and voice change.   Eyes: Negative for visual disturbance.  Respiratory: Negative for cough, chest tightness, shortness of breath and wheezing.   Cardiovascular: Negative for chest pain, palpitations and leg swelling.  Gastrointestinal: Positive for abdominal pain (cramping mid to lower abdomen). Negative for abdominal distention, anal bleeding, blood in stool, constipation, diarrhea, nausea, rectal pain and vomiting.       Bloating with some gas symptoms  Endocrine: Negative for cold intolerance, heat intolerance and polyuria.  Genitourinary: Negative for decreased urine volume, dysuria, frequency and hematuria.  Musculoskeletal: Negative for arthralgias, back pain and neck pain.  Skin: Negative for rash.  Allergic/Immunologic: Negative for environmental allergies.  Neurological: Positive for tremors (bilateral hand tremors chronic). Negative for dizziness, weakness, light-headedness, numbness and headaches.  Hematological: Negative for adenopathy.  Psychiatric/Behavioral: Negative for agitation, behavioral problems, confusion, dysphoric mood, hallucinations, self-injury, sleep disturbance and suicidal ideas. The patient is not nervous/anxious.    Per HPI unless specifically indicated above     Objective:    BP (!) 152/79 (BP Location: Left Arm, Patient Position: Sitting, Cuff Size: Small)   Pulse 80   Temp 98.3 F (36.8 C) (Oral)   Resp 16   Ht 5\' 11"  (1.803 m)   Wt 123 lb (55.8 kg)   BMI 17.16 kg/m   Wt Readings from Last 3 Encounters:  04/18/16 123 lb (55.8 kg)  06/17/15 134 lb (60.8 kg)  06/12/15 136 lb 14.5 oz (62.1 kg)    Physical Exam  Constitutional: He is oriented to person, place, and time. He appears well-developed and well-nourished. No distress.  Thin appearing, appears older than stated age, comfortable, cooperative, pleasant  HENT:  Head: Normocephalic and atraumatic.  Mouth/Throat: Oropharynx is clear and moist.   Eyes: Conjunctivae and EOM are normal. Pupils are equal, round, and reactive to light.  Neck: Normal range of motion. Neck supple. No thyromegaly present.  Cardiovascular: Normal rate, regular rhythm and intact distal pulses.   Murmur (2/6 systolic murmur right intercosteral / left sternal border with radiation to bilateral carotids) heard. Pulmonary/Chest: Effort normal and breath sounds normal. No respiratory distress. He has no wheezes. He has no rales.  Abdominal: Soft. Bowel sounds are normal. He exhibits no distension and no mass. There is tenderness (very mild RUQ tenderness on deeper palpation). There is no rebound and no guarding.  Mild enlarged lower liver margin on percussion  Musculoskeletal: Normal range of motion. He exhibits no edema or tenderness.  Back normal without deformity or abnormal curvature.  Upper / Lower Extremities: - Normal muscle tone, strength bilateral upper extremities 5/5, lower extremities 5/5 - Bilateral shoulders, knees, wrist, ankles without deformity, tenderness, effusion - Normal Gait  Lymphadenopathy:    He has no cervical adenopathy.  Neurological: He is alert and oriented to person, place, and time.  Skin: Skin is warm and dry. No rash noted. He is not diaphoretic.  Psychiatric: He has a normal  mood and affect. His behavior is normal. Thought content normal.  Nursing note and vitals reviewed.      Assessment & Plan:   Problem List Items Addressed This Visit    Tobacco abuse    Active smoker, not ready to quit 1-2 ppds Counseling on smoking cessation today, will follow-up      PVT (portal vein thrombosis)    Chronic problem, with mild / small portal venous thrombosis seen on MRI 06/2015, non-occlusive with some involvement of SMA. Suspected secondary to alcoholic cirrhosis. Referral to Gastroenterology - Dr Allen Norris, for further work-up management      Relevant Orders   Ambulatory referral to Gastroenterology   Protein-calorie  malnutrition, severe    Concern with low BMI 17 and low protein on chemistry in setting of alcoholic cirrhosis, prior difficulty with maintaining weight over past 1 yr after initial wt loss with acute illness last year, recent 2-3 weeks poor appetite due to abdominal cramping with iron tablets. Reassuring that no longer drinking alcohol Follow-up after taking iron with food titration, if still problems PO and poor weight gain, will refer to nutrition, may need supplement and MVI, thiamine, folic acid      Iron deficiency anemia due to chronic blood loss    Recent low Hgb to 8.3 with diverticulitis, in setting of chronic history of normocytic anemia, with prior baseline Hgb 12, previous significant low Hgb with hospitalization diverticular bleed 06/2015. - Check CBC today follow Hgb, may need repeat in 3-6 weeks after iron therapy - Suspect supplemental PO iron is causing abdominal cramping/discomfort symptoms, stop for few days, resume with food daily then inc to BID with food  UPDATE - Results of CBC with improved Hgb from 8.3 up to 9.9, still below baseline, Hct low, normal WBC. MCV 76 more consistent with microcytic anemia with chronic blood loss iron deficiency anemia, also suspect component of anemia of chronic disease 2/2 cirrhosis. Will add on labs Anemia Panel (iron, ferritin, TIBC, transferring, and possibly [id number] given alcoholic history).      Relevant Orders   CBC with Differential/Platelet (Completed)   Hypokalemia    Re-check chemistry, suspect electrolyte derangements were due to cirrhosis possibly acute bleeding and dehydration initially on hospitalization      Relevant Orders   COMPLETE METABOLIC PANEL WITH GFR (Completed)   Hypertension    Chronic problem, with wide pulse pressure, no prior treatment at this time, reportedly previously treated Check chemistry Follow-up within few weeks for physical, will re-check BP, if need may need to resume anti-HTN therapy       Relevant Orders   COMPLETE METABOLIC PANEL WITH GFR (Completed)   Heart murmur previously undiagnosed    Auscultated heart murmur on exam R 2nd ICS space and L sternal border, with radiation to bilateral carotids. No prior documented heart murmur or imaging with ECHO. Currently asymptomatic. Follow-up exam, and if persistent or develops change of symptoms will discuss ECHO and further work-up      GERD (gastroesophageal reflux disease)    Stable, continue H2 blocker with pepcid Remain off NSAIDs, likely contribution to gastritis If worsening symptoms or concerns for PUD, consider H pylori breath testing / inc to PPI therapy      Relevant Medications   famotidine (PEPCID) 20 MG tablet   Diverticulitis of large intestine without perforation or abscess without bleeding    Resolved, recent flare, acute diverticulitis with GIB 04/07/16 hospitalization. Completed Augmentin course. Last Hgb 8.3 Check CBC today to follow Hgb, now  on 2-3 weeks of iron Referral to GI for cirrhosis, can establish for diverticulosis as well and follow-up colonoscopy as advised previously, next due in 1-2 years, likely 2018      Relevant Medications   traMADol (ULTRAM) 50 MG tablet   Other Relevant Orders   CBC with Differential/Platelet (Completed)   Ambulatory referral to Gastroenterology   Alcoholic cirrhosis of liver without ascites ([place]) - Primary    Stable, suspected mild-moderate, secondary to prior heavy alcohol abuse, now abstaining from alcohol. Complication with portal venous thrombosis, low albumin, prior INR stable. Last imaging Liver MRI 06/2015. No prior gastroenterology establishment.  Plan: 1. Referral to establish with Gastroenterology - Dr Allen Norris, as he preferred local GI, never established with [facility] Hepatology, if needed will defer further referral to GI 2. Check chemistry CMET, CBC 3. Remain alcohol free, offered any resources if needed 4. Follow-up      Relevant Orders   COMPLETE METABOLIC  PANEL WITH GFR (Completed)   CBC with Differential/Platelet (Completed)   Ambulatory referral to Gastroenterology    Other Visit Diagnoses   None.     Meds ordered this encounter  Medications  . famotidine (PEPCID) 20 MG tablet    Sig: Take 20 mg by mouth 2 (two) times daily.  . traMADol (ULTRAM) 50 MG tablet    Sig: Take 50 mg by mouth every 6 (six) hours as needed.      Follow up plan: Return in about 4 weeks (around 05/16/2016) for physical, anemia follow-up.  [name], [facility] 04/19/2016, 8:52 AM

## 2016-04-18 NOTE — Assessment & Plan Note (Signed)
Auscultated heart murmur on exam R 2nd ICS space and L sternal border, with radiation to bilateral carotids. No prior documented heart murmur or imaging with ECHO. Currently asymptomatic. Follow-up exam, and if persistent or develops change of symptoms will discuss ECHO and further work-up

## 2016-04-18 NOTE — Assessment & Plan Note (Addendum)
Resolved, recent flare, acute diverticulitis with GIB 04/07/16 hospitalization. Completed Augmentin course. Last Hgb 8.3 Check CBC today to follow Hgb, now on 2-3 weeks of iron Referral to GI for cirrhosis, can establish for diverticulosis as well and follow-up colonoscopy as advised previously, next due in 1-2 years, likely 2018

## 2016-04-18 NOTE — Assessment & Plan Note (Signed)
Active smoker, not ready to quit 1-2 ppds Counseling on smoking cessation today, will follow-up

## 2016-04-18 NOTE — Patient Instructions (Addendum)
Thank you for coming in to clinic today.  1. Referral to Dr [name] today to establish, can get future colonoscopy in 2 years as needed, also if any future need for Hepatologist or other Liver Specialist, they should be able to refer you as needed  Labs check chemistry panel including liver, kidney and CBC with Hemoglobin blood counts  - For abdominal cramping, STOP iron for a few days, then resume once daily with food for several days, then if tolerated, increase to twice a day with food. - I will contact you with Hemoglobin results - and we may need to change this plan  Congratulations on remaining alcohol free! Let me know if you ever need any resources or help. Keep up the good work.  Let me know if you are interested or ready to consider quitting smoking.  You are due for Flu shot in near future.  Please schedule a follow-up appointment with [name] in 4-6 weeks to follow-up for Annual Physical, arrive fasting for labs, can follow-up Anemia  - ARRIVE FASTING, no food or drink after midnight  If you have any other questions or concerns, please feel free to call the clinic or send a message through [place]. You may also schedule an earlier appointment if necessary.  [name], DO [place]

## 2016-04-18 NOTE — Assessment & Plan Note (Signed)
Stable, suspected mild-moderate, secondary to prior heavy alcohol abuse, now abstaining from alcohol. Complication with portal venous thrombosis, low albumin, prior INR stable. Last imaging Liver MRI 06/2015. No prior gastroenterology establishment.  Plan: 1. Referral to establish with Gastroenterology - Dr Allen Norris, as he preferred local GI, never established with [facility] Hep[facility]ology, if needed will defer further referral to GI 2. Check chemistry CMET, CBC 3. Remain alcohol free, offered any resources if needed 4. Follow-up

## 2016-04-18 NOTE — Assessment & Plan Note (Signed)
Chronic problem, with wide pulse pressure, no prior treatment at this time, reportedly previously treated Check chemistry Follow-up within few weeks for physical, will re-check BP, if need may need to resume anti-HTN therapy

## 2016-04-18 NOTE — Assessment & Plan Note (Signed)
Chronic problem, with mild / small portal venous thrombosis seen on MRI 06/2015, non-occlusive with some involvement of SMA. Suspected secondary to alcoholic cirrhosis. Referral to Gastroenterology - Dr Allen Norris, for further work-up management

## 2016-04-18 NOTE — Assessment & Plan Note (Addendum)
Stable, continue H2 blocker with pepcid Remain off NSAIDs, likely contribution to gastritis If worsening symptoms or concerns for PUD, consider H pylori breath testing / inc to PPI therapy

## 2016-04-19 LAB — COMPLETE METABOLIC PANEL WITH GFR
ALBUMIN: 3.8 g/dL (ref 3.6–5.1)
ALK PHOS: 111 U/L (ref 40–115)
ALT: 10 U/L (ref 9–46)
AST: 14 U/L (ref 10–35)
BILIRUBIN TOTAL: 0.4 mg/dL (ref 0.2–1.2)
BUN: 10 mg/dL (ref 7–25)
CO2: 23 mmol/L (ref 20–31)
Calcium: 9.2 mg/dL (ref 8.6–10.3)
Chloride: 106 mmol/L (ref 98–110)
Creat: 0.57 mg/dL — ABNORMAL LOW (ref 0.70–1.33)
GFR, Est African American: >89 mL/min (ref >=60)
GFR, Est Non African American: >89 mL/min (ref >=60)
GLUCOSE: 88 mg/dL (ref 65–99)
Potassium: 3.3 mmol/L — ABNORMAL LOW (ref 3.5–5.3)
Sodium: 140 mmol/L (ref 135–146)
TOTAL PROTEIN: 6 g/dL — AB (ref 6.1–8.1)

## 2016-04-19 LAB — CBC WITH DIFFERENTIAL/PLATELET
BASOS ABS: 101 {cells}/uL (ref 0–200)
BASOS PCT: 1 %
EOS ABS: 202 {cells}/uL (ref 15–500)
Eosinophils Relative: 2 %
HEMATOCRIT: 31.9 % — AB (ref 38.5–50.0)
HEMOGLOBIN: 9.9 g/dL — AB (ref 13.2–17.1)
LYMPHS ABS: 1010 {cells}/uL (ref 850–3900)
Lymphocytes Relative: 10 %
MCH: 23.8 pg — ABNORMAL LOW (ref 27.0–33.0)
MCHC: 31 g/dL — ABNORMAL LOW (ref 32.0–36.0)
MCV: 76.7 fL — AB (ref 80.0–100.0)
MPV: 8.8 fL (ref 7.5–12.5)
Monocytes Absolute: 909 cells/uL (ref 200–950)
Monocytes Relative: 9 %
NEUTROS PCT: 78 %
Neutro Abs: 7878 cells/uL — ABNORMAL HIGH (ref 1500–7800)
Platelets: 119 10*3/uL — ABNORMAL LOW (ref 140–400)
RBC: 4.16 MIL/uL — ABNORMAL LOW (ref 4.20–5.80)
RDW: 19.1 % — ABNORMAL HIGH (ref 11.0–15.0)
WBC: 10.1 10*3/uL (ref 3.8–10.8)

## 2016-04-19 LAB — VITAMIN B12: VITAMIN B 12: 1077 pg/mL (ref 200–1100)

## 2016-04-19 LAB — RETICULOCYTES
ABS RETIC: 134400 {cells}/uL — AB (ref 25000–90000)
RBC.: 4.2 MIL/uL (ref 4.20–5.80)
Retic Ct Pct: 3.2 %

## 2016-04-19 LAB — IRON, TOTAL/TOTAL IRON BINDING CAP
%SAT: 63 % — ABNORMAL HIGH (ref 15–60)
IRON: 221 ug/dL — AB (ref 50–180)
TIBC: 353 ug/dL (ref 250–425)

## 2016-04-19 LAB — FERRITIN: Ferritin: 19 ng/mL — ABNORMAL LOW (ref 20–380)

## 2016-05-18 ENCOUNTER — Encounter: Payer: Self-pay | Admitting: Gastroenterology

## 2016-05-20 ENCOUNTER — Encounter: Payer: Self-pay | Admitting: Family Medicine

## 2016-06-17 ENCOUNTER — Ambulatory Visit (INDEPENDENT_AMBULATORY_CARE_PROVIDER_SITE_OTHER): Payer: 59 | Admitting: Family Medicine

## 2016-06-17 ENCOUNTER — Encounter: Payer: Self-pay | Admitting: Family Medicine

## 2016-06-17 VITALS — BP 135/84 | HR 72 | Temp 98.2°F | Resp 16 | Ht 71.0 in | Wt 127.7 lb

## 2016-06-17 DIAGNOSIS — Z23 Encounter for immunization: Secondary | ICD-10-CM | POA: Diagnosis not present

## 2016-06-17 DIAGNOSIS — K7030 Alcoholic cirrhosis of liver without ascites: Secondary | ICD-10-CM

## 2016-06-17 DIAGNOSIS — E440 Moderate protein-calorie malnutrition: Secondary | ICD-10-CM

## 2016-06-17 DIAGNOSIS — Z0000 Encounter for general adult medical examination without abnormal findings: Secondary | ICD-10-CM | POA: Diagnosis not present

## 2016-06-17 DIAGNOSIS — K219 Gastro-esophageal reflux disease without esophagitis: Secondary | ICD-10-CM | POA: Diagnosis not present

## 2016-06-17 DIAGNOSIS — R748 Abnormal levels of other serum enzymes: Secondary | ICD-10-CM | POA: Diagnosis not present

## 2016-06-17 DIAGNOSIS — I10 Essential (primary) hypertension: Secondary | ICD-10-CM | POA: Diagnosis not present

## 2016-06-17 DIAGNOSIS — D500 Iron deficiency anemia secondary to blood loss (chronic): Secondary | ICD-10-CM | POA: Diagnosis not present

## 2016-06-17 DIAGNOSIS — Z720 Tobacco use: Secondary | ICD-10-CM | POA: Diagnosis not present

## 2016-06-17 LAB — LIPID PANEL
Cholesterol: 151 mg/dL (ref <200)
HDL: 40 mg/dL — ABNORMAL LOW (ref >40)
LDL Cholesterol: 94 mg/dL (ref <100)
TRIGLYCERIDES: 85 mg/dL (ref <150)
Total CHOL/HDL Ratio: 3.8 Ratio (ref <5.0)
VLDL: 17 mg/dL (ref <30)

## 2016-06-17 MED ORDER — FAMOTIDINE 20 MG PO TABS: 20.0000 mg | ORAL_TABLET | Freq: Two times a day (BID) | ORAL | 0 refills | Status: DC

## 2016-06-17 NOTE — Patient Instructions (Addendum)
Thank you for coming in to clinic today.  1. Today check blood work cholesterol and prealbumin, nutritional marker, will follow-up discussion on these. We will use cholesterol to calculate 10 yr heart attack or stroke risk score and discuss medications for prevention  2. For Cirrhosis / Liver and Colonoscopy - please discuss further with Dr Allen Norris once you schedule apt  3. For iron deficiency, keep taking the one tablet every 3 days as you are currently, we can re-check iron levels again at next appointment 4-5 months from now. If not improving, we may need Hematology to do IV iron or other treatments, especially if you get symptoms of anemia.  4. For smoking cessation - Start by reducing on your own to a stable amount that is regular without fluctuation, if you can get to 0.5ppd great, otherwise 3/4 pack or 1 ppd is ok, but problem is when you change from 1 to 2ppd it makes it harder to quit - Try to have a fixed taper regimen, take one cigarette away every 2 or 3 days for a few weeks and plan it out to help you succeed - Once you are at a stable amount for at least a month, let me know by message or phone call if ready to start nicotine patches, usually we do 21 mg patch 24 hours at a time every day for 4 weeks then down to 14 mg patch for 2 weeks then 7 mg for 2 weeks, and you quit smoking early on  Future PSA prostate screening in 1-2 years with repeat blood PSA test  Please schedule LAB ONLY non fasting apt 1-2 weeks before your next visit to check anemia labs first  Please schedule a follow-up appointment with [name] in 4 to 5 months follow-up Anemia, Smoking  If you have any other questions or concerns, please feel free to call the clinic or send a message through [place]. You may also schedule an earlier appointment if necessary.  [name], DO [place]

## 2016-06-17 NOTE — Progress Notes (Signed)
Subjective:    Patient ID: Manuel Jimenez, male    DOB: 1961/06/29, 55 y.o.   MRN: [id number]  Manuel Jimenez is a 55 y.o. 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 He had work-up with liver MRI, endoscopy, never established with [facility] Hepatology or regular GI. He was referred to Dr Allen Norris (GI) but has not scheduled apt yet, plans to in 1-2 months - He is doing well without concerns today. Remains alcohol free >1 year without concerns. No prior dx of complicated alcohol withdrawal.  Poor Weight Gain / Suspected Moderate Protein-Calorie Malnutrition - Thought to be related to chronic alcohol abuse history and cirrhosis, prior labs with low total protein. He does not have any concerns of edema or swelling. Reports appetite is good, but has difficulty gaining weight. Taking MVI. Fluctuating weight, recent weight gain +4 lbs since last visit, improving.  ANEMIA, suspected chronic blood loss with gastritis / diverticulosis: - Last visit to establish care reviewed recent interval course over past 1 year with episodes of blood loss anemia, GIB, diverticulitis, dx gastritis, with concerns of alcohol abuse history. - Last labs 04/2016 with Hemoglobin 9.9, he has been on oral iron supplement, previously unable to tolerate 2 or more times a day due to GI intolerance, resolved now only on 1 iron every 3 days. Taking Pepcid for GERD / gastritis. - Denies any further rectal bleeding or melena, hematemesis, persistent abdominal pain, fatigue, dyspnea, chest pain, pre-syncope  SCREENING CHOLESTEROL: - No known prior history of HLD. Interested in fasting cholesterol screening today. Never on statin.  HTN No concerns. Does not check at home No Anti-HTN therapy, had previously been treated in  past.  TOBACCO ABUSE: - Active smoker, 1-2 ppd currently. Quit on own last year during hospitalization with acute illness, then eventually resumed smoking. Never tried NRT or medications. Not ready to quit at this time - Previously tried Wellbutrin/Zyban but was not successful - When ready discuss patches, get to 0.5 to 1 ppd steady  Health Maintenance: - Due for Flu shot, received today - UTD on TDap - Last colonoscopy 06/2015 (Dr Candace Cruise), with polyps and diverticulosis, he was advised to follow-up with repeat colonoscopy 1-2 years, plans to discuss with Dr Allen Norris GI at upcoming new visit (for cirrhosis) - No known family history of prostate cancer. Unsure if prior PSA. He has had DRE within past 1-2 years reportedly normal. Would like to hold off prostate cancer screening today, repeat again in 1-2 years  Additional Social History: - Mother lives nearby has alzheimer's dementia, and he helps provide care for her, also has hospice services for dementia care  Past Medical History:  Diagnosis Date  . Anemia   . Cirrhosis ([place])   . Colon polyps   . Hypertension    in past  . Portal vein thrombosis    see chart review 06/05/15  . Stomach ulcer    Social History   Social History  . Marital status: Single    Spouse name: N/A  . Number of children: N/A  . Years of education: High School   Occupational History  . Retired    Social History Main Topics  . Smoking status: Current Every Day Smoker    Packs/day: 1.00    Years: 35.00    Types:  Cigarettes  . Smokeless tobacco: Current User     Comment: Only successfully quit for 1 month before due to acute illness  . 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  . Drug use: No  . Sexual activity: Not on file   Other Topics Concern  . Not on file   Social History Narrative  . No narrative on file   Family History  Problem Relation Age of Onset  . CAD Father   . Heart attack Father    Current Outpatient  Prescriptions on File Prior to Visit  Medication Sig  . ferrous sulfate 325 (65 FE) MG tablet Take 1 tablet (325 mg total) by mouth daily with breakfast.   No current facility-administered medications on file prior to visit.     Review of Systems  Constitutional: Negative for activity change, appetite change (improved), chills, diaphoresis, fatigue, fever and unexpected weight change (has not gained weight back from prior illness within 82yr ago).  HENT: Negative for congestion, hearing loss, nosebleeds, sinus pressure, trouble swallowing and voice change.   Eyes: Negative for visual disturbance.  Respiratory: Negative for cough, chest tightness, shortness of breath and wheezing.   Cardiovascular: Negative for chest pain, palpitations and leg swelling.  Gastrointestinal: Negative for abdominal distention, abdominal pain (improved), anal bleeding, blood in stool, constipation, diarrhea, nausea, rectal pain and vomiting.  Endocrine: Negative for cold intolerance, heat intolerance and polyuria.  Genitourinary: Negative for decreased urine volume, dysuria, frequency and hematuria.  Musculoskeletal: Negative for arthralgias, back pain and neck pain.  Skin: Negative for rash.  Allergic/Immunologic: Negative for environmental allergies.  Neurological: Positive for tremors (bilateral hand tremors chronic). Negative for dizziness, weakness, light-headedness, numbness and headaches.  Hematological: Negative for adenopathy.  Psychiatric/Behavioral: Negative for agitation, behavioral problems, confusion, dysphoric mood, hallucinations, self-injury, sleep disturbance and suicidal ideas. The patient is not nervous/anxious.    Per HPI unless specifically indicated above     Objective:    BP 135/84   Pulse 72   Temp 98.2 F (36.8 C) (Oral)   Resp 16   Ht 5\' 11"  (1.803 m)   Wt 127 lb 11.2 oz (57.9 kg)   BMI 17.81 kg/m   Wt Readings from Last 3 Encounters:  06/17/16 127 lb 11.2 oz (57.9 kg)   04/18/16 123 lb (55.8 kg)  06/17/15 134 lb (60.8 kg)    Physical Exam  Constitutional: He appears well-developed and well-nourished. No distress.  Thin appearing, appears older than stated age, comfortable, cooperative, pleasant  HENT:  Head: Normocephalic and atraumatic.  Mouth/Throat: Oropharynx is clear and moist.  Eyes: Conjunctivae are normal. Pupils are equal, round, and reactive to light.  Neck: Normal range of motion. Neck supple. No thyromegaly present.  Cardiovascular: Normal rate, regular rhythm and intact distal pulses.   Murmur (Stable 2/6 systolic murmur right intercosteral / left sternal border with radiation to bilateral carotids) heard. Pulmonary/Chest: Effort normal and breath sounds normal. No respiratory distress. He has no wheezes. He has no rales.  Abdominal: Soft. Bowel sounds are normal. He exhibits no distension and no mass. There is no tenderness (Resolved RUQ tenderness). There is no rebound and no guarding.  Repeat liver percussion today seems to be within normal limits, compared to previous, possibly stable to improved  Musculoskeletal: Normal range of motion. He exhibits no edema or tenderness.  Back normal without deformity or abnormal curvature.  Upper / Lower Extremities: - Normal muscle tone, strength bilateral upper extremities  5/5, lower extremities 5/5 - Bilateral shoulders, knees, wrist, ankles without deformity, tenderness, effusion - Normal Gait  Neurological: He is alert.  Skin: Skin is warm and dry. No rash noted. He is not diaphoretic.  Psychiatric: He has a normal mood and affect. His behavior is normal. Thought content normal.  Nursing note and vitals reviewed.      Assessment & Plan:   Problem List Items Addressed This Visit    Tobacco abuse    Ready to start process of quitting, but smoking variable amount 1-2ppd - Prior failed Zyban  Plan: - Self taper slowly with schedule over next few weeks to months to stable amount 0.5 to  1ppd - Notify office and can rx NRT patches, taper 21mg  x 4 weeks > 14 x 2 > 7 x 2 then off, given [place] Quitline resource, follow-up as needed      Moderate protein-calorie malnutrition ([place])    Weight gain since last visit +4 lbs in 2 months. BMI still 17. - Low Protein, without edema - Remains alcohol free  Plan: 1. Check pre-albumin today, pending result 2. Continue MVI, encouraged regular nutrition 3. Follow-up weight trend, consider nutritional referral in future      Relevant Orders   Prealbumin (Completed)   Low serum HDL    Fasting lipids today, results with low HDL 40, otherwise normal cholesterol panel, no indication for statin therapy. Likely low cholesterol with low weight and concern protein-calorie malnutrition      Iron deficiency anemia due to chronic blood loss    Improved, Hgb up to 9.9 on oral iron (reduced frequency now from BID to 1 q 3 days due to GI intolerance), thought secondary to gastritis, diverticulitis some GI losses - Iron panel 04/2016: low ferritin. Not consistent with hemochromatosis - Asymptomatic  Plan: 1. Continue iron oral as tolerated, can try to gradually increase frequency in future 2. Future orders repeat iron studies CBC, iron panel in 5 moths with next visit, if improved or maintaining then continue, otherwise consider hematology for IV iron eval      Relevant Orders   CBC with Differential/Platelet   Iron and TIBC   Ferritin   Hypertension    Stable, controlled currently without medication. Likely with weight loss. Follow-up      Relevant Orders   Lipid panel (Completed)   GERD (gastroesophageal reflux disease)    Controlled on Pepcid. No NSAIDs Follow-up if recurrent gastritis or worsening bleeding, consider re-eval for PUD      Relevant Medications   famotidine (PEPCID) 20 MG tablet   Alcoholic cirrhosis of liver without ascites (HCC)    Stable without evidence of worsening. Remains alcohol free >1 year. Complication with  portal venous thrombosis on prior imaging, low albumin, iron deficiency  Plan: 1. Awaiting patient to schedule apt to establish with GI Dr Allen Norris, in future may need [facility] Hepatology 2. Encouraged remain abstained from alcohol 3. Follow-up      Relevant Orders   Lipid panel (Completed)   Prealbumin (Completed)    Other Visit Diagnoses    Annual physical exam    -  Primary   Needs flu shot       Relevant Orders   Flu Vaccine QUAD 36+ mos IM (Completed)      Meds ordered this encounter  Medications  . famotidine (PEPCID) 20 MG tablet    Sig: Take 1 tablet (20 mg total) by mouth 2 (two) times daily.    Dispense:  60 tablet  Refill:  0      Follow up plan: Return in about 5 months (around 11/15/2016) for anemia, smoking.  [name], DO [facility] 06/18/2016, 10:38 AM

## 2016-06-18 DIAGNOSIS — R748 Abnormal levels of other serum enzymes: Secondary | ICD-10-CM | POA: Insufficient documentation

## 2016-06-18 NOTE — Assessment & Plan Note (Signed)
Stable, controlled currently without medication. Likely with weight loss. Follow-up

## 2016-06-18 NOTE — Assessment & Plan Note (Signed)
Ready to start process of quitting, but smoking variable amount 1-2ppd - Prior failed Zyban  Plan: - Self taper slowly with schedule over next few weeks to months to stable amount 0.5 to 1ppd - Notify office and can rx NRT patches, taper 21mg  x 4 weeks > 14 x 2 > 7 x 2 then off, given [place] Quitline resource, follow-up as needed

## 2016-06-18 NOTE — Assessment & Plan Note (Signed)
Weight gain since last visit +4 lbs in 2 months. BMI still 17. - Low Protein, without edema - Remains alcohol free  Plan: 1. Check pre-albumin today, pending result 2. Continue MVI, encouraged regular nutrition 3. Follow-up weight trend, consider nutritional referral in future

## 2016-06-18 NOTE — Assessment & Plan Note (Signed)
Stable without evidence of worsening. Remains alcohol free >1 year. Complication with portal venous thrombosis on prior imaging, low albumin, iron deficiency  Plan: 1. Awaiting patient to schedule apt to establish with GI Dr Allen Norris, in future may need [facility] Hepatology 2. Encouraged remain abstained from alcohol 3. Follow-up

## 2016-06-18 NOTE — Assessment & Plan Note (Signed)
Fasting lipids today, results with low HDL 40, otherwise normal cholesterol panel, no indication for statin therapy. Likely low cholesterol with low weight and concern protein-calorie malnutrition

## 2016-06-18 NOTE — Assessment & Plan Note (Signed)
Controlled on Pepcid. No NSAIDs Follow-up if recurrent gastritis or worsening bleeding, consider re-eval for PUD

## 2016-06-18 NOTE — Assessment & Plan Note (Addendum)
Improved, Hgb up to 9.9 on oral iron (reduced frequency now from BID to 1 q 3 days due to GI intolerance), thought secondary to gastritis, diverticulitis some GI losses - Iron panel 04/2016: low ferritin. Not consistent with hemochromatosis - Asymptomatic  Plan: 1. Continue iron oral as tolerated, can try to gradually increase frequency in future 2. Future orders repeat iron studies CBC, iron panel in 5 moths with next visit, if improved or maintaining then continue, otherwise consider hematology for IV iron eval

## 2016-06-21 LAB — PREALBUMIN: Prealbumin: 24 mg/dL (ref 21–43)

## 2016-09-29 DIAGNOSIS — B029 Zoster without complications: Secondary | ICD-10-CM

## 2016-09-29 HISTORY — DX: Zoster without complications: B02.9

## 2016-10-19 ENCOUNTER — Ambulatory Visit (INDEPENDENT_AMBULATORY_CARE_PROVIDER_SITE_OTHER): Payer: 59 | Admitting: Family Medicine

## 2016-10-19 ENCOUNTER — Encounter: Payer: Self-pay | Admitting: Family Medicine

## 2016-10-19 VITALS — BP 142/74 | HR 79 | Temp 98.8°F | Resp 16 | Ht 71.0 in | Wt 140.8 lb

## 2016-10-19 DIAGNOSIS — B029 Zoster without complications: Secondary | ICD-10-CM

## 2016-10-19 MED ORDER — VALACYCLOVIR HCL 1 G PO TABS: 1000.0000 mg | ORAL_TABLET | Freq: Three times a day (TID) | ORAL | 0 refills | Status: DC

## 2016-10-19 NOTE — Patient Instructions (Signed)
Thank you for coming in to clinic today.  1. Consistent with Shingles outbreak, from old virus in your system, chicken pox now re-activated due to variety of factors likely stress, age, and liver problems - This is only on one side of body and spreads in nerve pattern - Associated with tingling, burning, itching, can linger after the rash is healed, and you can get "post-herpetic neuralgia" this can be chronic pain that lingers in this area, nerve pain we can discuss treatment for this in future as needed  Start Valacyclovir anti viral one pill 3 times a day for 7 days to treat the flare up  Limit scratching, if skin is red swelling, spreading redness, fevers/chills, drainage of pus, then maybe secondary bacterial skin infection, return to office sooner  Can use vaseline or neosporin topical ointment to help protect rash currently  Please schedule a follow-up appointment with [name] in 3-4 weeks follow-up Shingles  If you have any other questions or concerns, please feel free to call the clinic or send a message through [place]. You may also schedule an earlier appointment if necessary.  [name], DO [place]

## 2016-10-19 NOTE — Assessment & Plan Note (Signed)
Clinically consistent with new acute shingles herpes zoster outbreak onset 4-5 days ago, with only mild neuropathic symptoms without significant pain. - Suspect likely trigger with reduced immune system with alcoholic cirrhosis and recent life stressors - Never received zoster vaccine - No evidence of complications (without secondary skin infection), no ocular or facial involvement  Plan: 1. Start Valacyclovir 1000mg  TID with food x 7 days, counseled on treatment course and side effects 2. Discussed additional neuropathic pain med options such as gabapentin, TCA, Tramadol PRN however mutual decision hold off on these for now due to mild symptoms and well tolerated - additionally he is especially not interested in gabapentin at this moment, expresses concern with abuse potential with his daughter using substances and taking high dose gabapentin 3. Counseling on infectious nature of problem with avoiding close contact with high risk populations 4. Follow-up as needed within 3-4 weeks if not resolving or if residual pain, strict return criteria if ocular involvement or other acute concerns

## 2016-10-19 NOTE — Progress Notes (Signed)
Subjective:    Patient ID: Manuel Jimenez, male    DOB: May 04, 1961, 56 y.o.   MRN: [id number]  Manuel Jimenez is a 56 y.o. male presenting on 10/19/2016 for Rash (onset 5 days started on back side and spreadng in front abdominal area)   HPI  ACUTE SHINGLES Outbreak, Rash: - Reports symptoms started acutely about 4-5 days ago, felt initial symptoms with "tingling and itching", he did scratch this area more, and then later developed red raised rash across Right lower abdomen to flank and back. - Today rash seems to be gradually worsening, some areas of scab. Not tried any treatments, he did read about shingles and wanted to get it evaluated today - He has history of chronic illness with alcoholic liver cirrhosis, also recent life stressors with family stress (ill family member with dementia) - No prior shingles flare and no prior shingles vaccine - Denies any fevers/chills, spreading redness, drainage of pus, numbness, vision changes, other rash   Social History  Substance Use Topics  . Smoking status: Current Every Day Smoker    Packs/day: 1.00    Years: 35.00    Types: Cigarettes  . Smokeless tobacco: Current User     Comment: Only successfully quit for 1 month before due to acute illness  . 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    Review of Systems Per HPI unless specifically indicated above     Objective:    BP (!) 142/74   Pulse 79   Temp 98.8 F (37.1 C) (Oral)   Resp 16   Ht 5\' 11"  (1.803 m)   Wt 140 lb 12.8 oz (63.9 kg)   BMI 19.64 kg/m   Wt Readings from Last 3 Encounters:  10/19/16 140 lb 12.8 oz (63.9 kg)  06/17/16 127 lb 11.2 oz (57.9 kg)  04/18/16 123 lb (55.8 kg)    Physical Exam  Constitutional: He is oriented to person, place, and time. He appears well-developed and well-nourished. No distress.  Well-appearing, comfortable, cooperative  HENT:  Head: Normocephalic and atraumatic.  Mouth/Throat: Oropharynx is clear and  moist.  Eyes: Conjunctivae are normal.  Cardiovascular: Normal rate.   Pulmonary/Chest: Effort normal.  Neurological: He is alert and oriented to person, place, and time.  Skin: Skin is warm and dry. Rash (Characteristic patchy maculapular rash with internal vesicular formations scattered from R abdomen near midline across R flank to R low to mid back, again not crossing midline. Non tender. No extending erythema. Some scabbing on back rash.) noted. He is not diaphoretic. No erythema.  Psychiatric: His behavior is normal.  Nursing note and vitals reviewed.    Right Abdomen / Flank   Right Back       Assessment & Plan:   Problem List Items Addressed This Visit    Herpes zoster without complication - Primary    Clinically consistent with new acute shingles herpes zoster outbreak onset 4-5 days ago, with only mild neuropathic symptoms without significant pain. - Suspect likely trigger with reduced immune system with alcoholic cirrhosis and recent life stressors - Never received zoster vaccine - No evidence of complications (without secondary skin infection), no ocular or facial involvement  Plan: 1. Start Valacyclovir 1000mg  TID with food x 7 days, counseled on treatment course and side effects 2. Discussed additional neuropathic pain med options such as gabapentin, TCA, Tramadol PRN however mutual decision hold off on these for now due to mild symptoms  and well tolerated - additionally he is especially not interested in gabapentin at this moment, expresses concern with abuse potential with his daughter using substances and taking high dose gabapentin 3. Counseling on infectious nature of problem with avoiding close contact with high risk populations 4. Follow-up as needed within 3-4 weeks if not resolving or if residual pain, strict return criteria if ocular involvement or other acute concerns      Relevant Medications   valACYclovir (VALTREX) 1000 MG tablet      Meds ordered this  encounter  Medications  . valACYclovir (VALTREX) 1000 MG tablet    Sig: Take 1 tablet (1,000 mg total) by mouth 3 (three) times daily. For 7 days, take with food    Dispense:  21 tablet    Refill:  0      Follow up plan: Return in about 3 weeks (around 11/09/2016), or if symptoms worsen or fail to improve, for shingles.  Manuel Jimenez, [facility] 10/19/2016, 11:02 AM

## 2016-11-21 ENCOUNTER — Other Ambulatory Visit: Payer: Self-pay

## 2016-11-21 DIAGNOSIS — D500 Iron deficiency anemia secondary to blood loss (chronic): Secondary | ICD-10-CM

## 2016-11-21 DIAGNOSIS — D696 Thrombocytopenia, unspecified: Secondary | ICD-10-CM

## 2016-11-21 LAB — IRON AND TIBC
%SAT: 4 % — AB (ref 15–60)
Iron: 19 ug/dL — ABNORMAL LOW (ref 50–180)
TIBC: 449 ug/dL — ABNORMAL HIGH (ref 250–425)
UIBC: 430 ug/dL — AB (ref 125–400)

## 2016-11-22 ENCOUNTER — Telehealth: Payer: Self-pay

## 2016-11-22 ENCOUNTER — Ambulatory Visit: Payer: 59 | Admitting: Family Medicine

## 2016-11-22 DIAGNOSIS — D696 Thrombocytopenia, unspecified: Secondary | ICD-10-CM | POA: Insufficient documentation

## 2016-11-22 DIAGNOSIS — D500 Iron deficiency anemia secondary to blood loss (chronic): Secondary | ICD-10-CM

## 2016-11-22 LAB — CBC WITH DIFFERENTIAL/PLATELET
BASOS ABS: 49 {cells}/uL (ref 0–200)
Basophils Relative: 1 %
Eosinophils Absolute: 196 cells/uL (ref 15–500)
Eosinophils Relative: 4 %
HEMATOCRIT: 35 % — AB (ref 38.5–50.0)
HEMOGLOBIN: 11 g/dL — AB (ref 13.2–17.1)
LYMPHS ABS: 735 {cells}/uL — AB (ref 850–3900)
Lymphocytes Relative: 15 %
MCH: 25 pg — ABNORMAL LOW (ref 27.0–33.0)
MCHC: 31.4 g/dL — AB (ref 32.0–36.0)
MCV: 79.5 fL — ABNORMAL LOW (ref 80.0–100.0)
MONO ABS: 392 {cells}/uL (ref 200–950)
Monocytes Relative: 8 %
NEUTROS PCT: 72 %
Neutro Abs: 3528 cells/uL (ref 1500–7800)
Platelets: 78 10*3/uL — ABNORMAL LOW (ref 140–400)
RBC: 4.4 MIL/uL (ref 4.20–5.80)
RDW: 20.1 % — ABNORMAL HIGH (ref 11.0–15.0)
WBC: 4.9 10*3/uL (ref 3.8–10.8)

## 2016-11-22 LAB — FERRITIN: FERRITIN: 5 ng/mL — AB (ref 20–380)

## 2016-11-22 NOTE — Telephone Encounter (Signed)
Referral placed for Gastroenterology.  Manuel Jimenez, [facility] 11/22/2016, 11:59 AM

## 2016-11-22 NOTE — Telephone Encounter (Signed)
-----   Message from [name], DO sent at 11/22/2016  8:21 AM EDT ----- Lab results released to MyChart with comments for patient.  1. CBC - shows a slightly elevated Hemoglobin 11.0 up from 9.9, still consistent with anemia. MCV remains low as well. And Platelets lower than before too at 78.  2. Iron Stores / Ferritin - These results are even lower than last time Ferritin 5 (down from 19), and Iron level is 19, saturation of iron is 4% down from 63%.  Overall I am concerned that anemia is getting worse. The oral iron pill every few days is not enough.  I have advised him that he will need referral to Hematology, this referral has been placed. Goal to discuss Anemia, Thrombocytopenia, may need IV iron therapy. Also he needs to schedule to see GI Dr Allen Norris as discussed at last visit, to notify us if needs new referral.  [name], [facility] 11/22/2016, 8:21 AM

## 2016-11-22 NOTE — Telephone Encounter (Signed)
Patient advised as below. Patient verbalizes understanding and is in agreement with treatment plan. Patient is requesting referral to GI.

## 2016-12-07 NOTE — Progress Notes (Signed)
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 He also has thrombocytopenia. He was attempted on a trial of oral iron, but could not tolerate secondary to severe constipation. Currently, patient feels well and is at his baseline. He does not complain of weakness or fatigue. He has no neurologic complaints. He denies any recent fevers or illnesses. He has a good appetite and denies weight loss. He denies any chest pain or shortness of breath. He denies any nausea, vomiting, constipation, or diarrhea. He has no urinary complaints. Patient offers no further specific complaints today.  REVIEW OF SYSTEMS:   Review of Systems  Constitutional: Negative.  Negative for fever, malaise/fatigue and weight loss.  Respiratory: Negative.  Negative for cough, hemoptysis and shortness of breath.   Cardiovascular: Negative.  Negative for chest pain and leg swelling.  Gastrointestinal: Negative.  Negative for abdominal pain, blood in stool and melena.  Genitourinary: Negative.   Musculoskeletal: Negative.   Skin: Negative.  Negative for rash.  Neurological: Negative.  Negative for sensory change and weakness.  Psychiatric/Behavioral: Negative.  The patient is not nervous/anxious.     As per HPI. Otherwise, a complete review of systems is negative.  PAST MEDICAL HISTORY: Past Medical History:  Diagnosis Date  . Anemia   . Cancer ([place])   . Cirrhosis ([place])   . Colon polyps   . Constipation   . Diarrhea   . Hemorrhoids   . Hypertension    in past  . Portal vein thrombosis    see chart review 06/05/15  .  Stomach ulcer   . Weight loss     PAST SURGICAL HISTORY: Past Surgical History:  Procedure Laterality Date  . COLONOSCOPY N/A 06/17/2015   Procedure: COLONOSCOPY;  Surgeon: Hulen Luster, MD;  Location: [facility];  Service: Gastroenterology;  Laterality: N/A;  . ESOPHAGOGASTRODUODENOSCOPY N/A 05/14/2015   Procedure: ESOPHAGOGASTRODUODENOSCOPY (EGD);  Surgeon: Hulen Luster, MD;  Location: [facility] ENDOSCOPY;  Service: Endoscopy;  Laterality: N/A;    FAMILY HISTORY: Family History  Problem Relation Age of Onset  . CAD Father   . Heart attack Father     ADVANCED DIRECTIVES (Y/N):  N  HEALTH MAINTENANCE: Social History  Substance Use Topics  . Smoking status: Current Every Day Smoker    Packs/day: 2.00    Years: 40.00    Types: Cigarettes  . Smokeless tobacco: Current User     Comment: Only successfully quit for 1 month before due to acute illness  . 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     Colonoscopy:  PAP:  Bone density:  Lipid panel:  Allergies  Allergen Reactions  . Lorazepam     Other reaction(s): Other (See Comments) "bad affect"    Current Outpatient Prescriptions  Medication Sig Dispense Refill  . famotidine (PEPCID) 20 MG tablet Take 1 tablet (20 mg total) by mouth 2 (two) times daily. 60 tablet 0  . loratadine (CLARITIN) 10 MG tablet Take 10 mg by mouth daily.    . ferrous sulfate 325 (65 FE) MG tablet Take 1 tablet (325 mg total) by mouth daily with  breakfast. (Patient not taking: Reported on 10/19/2016) 30 tablet 0  . valACYclovir (VALTREX) 1000 MG tablet Take 1 tablet (1,000 mg total) by mouth 3 (three) times daily. For 7 days, take with food (Patient not taking: Reported on 12/09/2016) 21 tablet 0   No current facility-administered medications for this visit.     OBJECTIVE: Vitals:   12/09/16 1131  BP: (!) 156/81  Pulse: 68  Resp: 18  Temp: 97.9 F (36.6 C)     Body mass index is 20.05 kg/m.    ECOG FS:0 -  Asymptomatic  General: Well-developed, well-nourished, no acute distress. Eyes: Pink conjunctiva, anicteric sclera. HEENT: Normocephalic, moist mucous membranes, clear oropharnyx. Lungs: Clear to auscultation bilaterally. Heart: Regular rate and rhythm. No rubs, murmurs, or gallops. Abdomen: Soft, nontender, nondistended. No organomegaly noted, normoactive bowel sounds. Musculoskeletal: No edema, cyanosis, or clubbing. Neuro: Alert, answering all questions appropriately. Cranial nerves grossly intact. Skin: No rashes or petechiae noted. Psych: Normal affect. Lymphatics: No cervical, calvicular, axillary or inguinal LAD.   LAB RESULTS:  Lab Results  Component Value Date   NA 140 04/18/2016   K 3.3 (L) 04/18/2016   CL 106 04/18/2016   CO2 23 04/18/2016   GLUCOSE 88 04/18/2016   BUN 10 04/18/2016   CREATININE 0.57 (L) 04/18/2016   CALCIUM 9.2 04/18/2016   PROT 6.0 (L) 04/18/2016   ALBUMIN 3.8 04/18/2016   AST 14 04/18/2016   ALT 10 04/18/2016   ALKPHOS 111 04/18/2016   BILITOT 0.4 04/18/2016   GFRNONAA >89 04/18/2016   GFRAA >89 04/18/2016    Lab Results  Component Value Date   WBC 4.9 11/21/2016   NEUTROABS 3,528 11/21/2016   HGB 11.0 (L) 11/21/2016   HCT 35.0 (L) 11/21/2016   MCV 79.5 (L) 11/21/2016   PLT 78 (L) 11/21/2016   Lab Results  Component Value Date   IRON 19 (L) 11/21/2016   TIBC 449 (H) 11/21/2016   IRONPCTSAT 4 (L) 11/21/2016   Lab Results  Component Value Date   FERRITIN 5 (L) 11/21/2016     STUDIES: No results found.  ASSESSMENT: Iron deficiency anemia, thrombocytopenia  PLAN:    1. Iron deficiency anemia: Likely secondary to GI blood loss. Patient has consultation with GI on Dec 15, 2016. He will likely need colonoscopy and/or EGD for further evaluation. Patient cannot tolerate oral iron supplementation, therefore will proceed with 510 mg of IV Feraheme next week. We will give him a second dose 1 week later. Patient will then return to  clinic in 2 months with repeat laboratory work and further evaluation. If there is not resolution of his iron deficiency anemia, will consider full workup at that point. 2. Thrombocytopenia: Likely secondary to ongoing cirrhosis. Patient was also noted to have mild splenomegaly on CT scan in November 2016. Consider repeat imaging in the near future. 3. Liver lesions: Patient was noted to have several poorly defined low-attenuation lesions in his left hepatic lobe on CT scan on June 02, 2015. No follow-up CT scan has been completed for evaluation. Consider repeat imaging as above.  Patient expressed understanding and was in agreement with this plan. He also understands that He can call clinic at any time with any questions, concerns, or complaints.   Cancer Staging No matching staging information was found for the patient.  Lloyd Huger, MD   12/11/2016 7:48 AM

## 2016-12-09 ENCOUNTER — Inpatient Hospital Stay: Payer: 59 | Attending: Oncology | Admitting: Oncology

## 2016-12-09 ENCOUNTER — Ambulatory Visit: Payer: Self-pay | Admitting: Oncology

## 2016-12-09 ENCOUNTER — Encounter: Payer: Self-pay | Admitting: Oncology

## 2016-12-09 VITALS — BP 156/81 | HR 68 | Temp 97.9°F | Resp 18 | Ht 71.0 in | Wt 143.7 lb

## 2016-12-09 DIAGNOSIS — D509 Iron deficiency anemia, unspecified: Secondary | ICD-10-CM | POA: Insufficient documentation

## 2016-12-09 DIAGNOSIS — F17210 Nicotine dependence, cigarettes, uncomplicated: Secondary | ICD-10-CM | POA: Diagnosis not present

## 2016-12-09 DIAGNOSIS — K7460 Unspecified cirrhosis of liver: Secondary | ICD-10-CM | POA: Diagnosis not present

## 2016-12-09 DIAGNOSIS — D696 Thrombocytopenia, unspecified: Secondary | ICD-10-CM | POA: Insufficient documentation

## 2016-12-09 DIAGNOSIS — K5900 Constipation, unspecified: Secondary | ICD-10-CM | POA: Insufficient documentation

## 2016-12-09 DIAGNOSIS — D500 Iron deficiency anemia secondary to blood loss (chronic): Secondary | ICD-10-CM

## 2016-12-09 DIAGNOSIS — Z86010 Personal history of colonic polyps: Secondary | ICD-10-CM | POA: Diagnosis not present

## 2016-12-09 DIAGNOSIS — Z79899 Other long term (current) drug therapy: Secondary | ICD-10-CM

## 2016-12-09 DIAGNOSIS — K769 Liver disease, unspecified: Secondary | ICD-10-CM

## 2016-12-09 DIAGNOSIS — I10 Essential (primary) hypertension: Secondary | ICD-10-CM | POA: Diagnosis not present

## 2016-12-09 NOTE — Progress Notes (Signed)
Patient here today as OPNA regarding iron deficiency anemia.  Referred by [name].  Patient has history of cirrhosis.  Patient has SOB, more on exertion.  Currently not taking oral iron.

## 2016-12-13 ENCOUNTER — Ambulatory Visit: Payer: Self-pay | Admitting: Hematology and Oncology

## 2016-12-14 ENCOUNTER — Inpatient Hospital Stay: Payer: 59

## 2016-12-14 VITALS — BP 105/66 | HR 61 | Temp 97.8°F | Resp 18

## 2016-12-14 DIAGNOSIS — D500 Iron deficiency anemia secondary to blood loss (chronic): Secondary | ICD-10-CM

## 2016-12-14 DIAGNOSIS — D509 Iron deficiency anemia, unspecified: Secondary | ICD-10-CM | POA: Diagnosis not present

## 2016-12-14 MED ORDER — SODIUM CHLORIDE 0.9 % IV SOLN
510.0000 mg | Freq: Once | INTRAVENOUS | Status: AC
  Administered 2016-12-14: 510 mg via INTRAVENOUS
  Filled 2016-12-14: qty 17

## 2016-12-14 MED ORDER — SODIUM CHLORIDE 0.9 % IV SOLN
Freq: Once | INTRAVENOUS | Status: AC
  Administered 2016-12-14: 14:00:00 via INTRAVENOUS
  Filled 2016-12-14: qty 1000

## 2016-12-14 NOTE — Patient Instructions (Signed)

## 2016-12-15 ENCOUNTER — Ambulatory Visit: Payer: Self-pay | Admitting: Gastroenterology

## 2016-12-16 ENCOUNTER — Ambulatory Visit: Payer: 59

## 2016-12-21 ENCOUNTER — Inpatient Hospital Stay: Payer: 59

## 2016-12-21 VITALS — BP 100/65 | HR 66 | Temp 97.0°F | Resp 18

## 2016-12-21 DIAGNOSIS — D509 Iron deficiency anemia, unspecified: Secondary | ICD-10-CM | POA: Diagnosis not present

## 2016-12-21 DIAGNOSIS — D500 Iron deficiency anemia secondary to blood loss (chronic): Secondary | ICD-10-CM

## 2016-12-21 MED ORDER — SODIUM CHLORIDE 0.9 % IV SOLN
510.0000 mg | Freq: Once | INTRAVENOUS | Status: AC
  Administered 2016-12-21: 510 mg via INTRAVENOUS
  Filled 2016-12-21: qty 17

## 2016-12-21 MED ORDER — SODIUM CHLORIDE 0.9 % IV SOLN
Freq: Once | INTRAVENOUS | Status: AC
  Administered 2016-12-21: 14:00:00 via INTRAVENOUS
  Filled 2016-12-21: qty 1000

## 2016-12-21 NOTE — Patient Instructions (Signed)

## 2017-01-19 ENCOUNTER — Other Ambulatory Visit
Admission: RE | Admit: 2017-01-19 | Discharge: 2017-01-19 | Disposition: A | Discharge status: Home / Self Care | Payer: 59 | Source: Ambulatory Visit | Attending: Gastroenterology | Admitting: Gastroenterology

## 2017-01-19 ENCOUNTER — Other Ambulatory Visit: Payer: Self-pay

## 2017-01-19 ENCOUNTER — Ambulatory Visit (INDEPENDENT_AMBULATORY_CARE_PROVIDER_SITE_OTHER): Payer: 59 | Admitting: Gastroenterology

## 2017-01-19 ENCOUNTER — Encounter: Payer: Self-pay | Admitting: Gastroenterology

## 2017-01-19 VITALS — BP 149/89 | HR 82 | Temp 98.7°F | Ht 71.0 in | Wt 138.5 lb

## 2017-01-19 DIAGNOSIS — K7460 Unspecified cirrhosis of liver: Secondary | ICD-10-CM

## 2017-01-19 LAB — COMPREHENSIVE METABOLIC PANEL
ALBUMIN: 4.4 g/dL (ref 3.5–5.0)
ALT: 19 U/L (ref 17–63)
AST: 19 U/L (ref 15–41)
Alkaline Phosphatase: 101 U/L (ref 38–126)
Anion gap: 10 (ref 5–15)
BILIRUBIN TOTAL: 0.8 mg/dL (ref 0.3–1.2)
BUN: 8 mg/dL (ref 6–20)
CHLORIDE: 105 mmol/L (ref 101–111)
CO2: 26 mmol/L (ref 22–32)
Calcium: 9.3 mg/dL (ref 8.9–10.3)
Creatinine, Ser: 0.64 mg/dL (ref 0.61–1.24)
GFR calc Af Amer: >60 mL/min (ref >60)
GFR calc non Af Amer: >60 mL/min (ref >60)
GLUCOSE: 92 mg/dL (ref 65–99)
POTASSIUM: 3.1 mmol/L — AB (ref 3.5–5.1)
Sodium: 141 mmol/L (ref 135–145)
Total Protein: 7.2 g/dL (ref 6.5–8.1)

## 2017-01-19 LAB — CBC WITH DIFFERENTIAL/PLATELET
Basophils Absolute: 0.1 10*3/uL (ref 0–0.1)
Basophils Relative: 1 %
EOS PCT: 2 %
Eosinophils Absolute: 0.1 10*3/uL (ref 0–0.7)
HCT: 45.5 % (ref 40.0–52.0)
Hemoglobin: 15 g/dL (ref 13.0–18.0)
LYMPHS ABS: 0.8 10*3/uL — AB (ref 1.0–3.6)
LYMPHS PCT: 11 %
MCH: 28.7 pg (ref 26.0–34.0)
MCHC: 33 g/dL (ref 32.0–36.0)
MCV: 86.9 fL (ref 80.0–100.0)
MONOS PCT: 9 %
Monocytes Absolute: 0.7 10*3/uL (ref 0.2–1.0)
Neutro Abs: 5.9 10*3/uL (ref 1.4–6.5)
Neutrophils Relative %: 77 %
PLATELETS: 68 10*3/uL — AB (ref 150–440)
RBC: 5.23 MIL/uL (ref 4.40–5.90)
RDW: 19.8 % — AB (ref 11.5–14.5)
WBC: 7.6 10*3/uL (ref 3.8–10.6)

## 2017-01-19 LAB — PROTIME-INR
INR: 1.04
PROTHROMBIN TIME: 13.6 s (ref 11.4–15.2)

## 2017-01-19 NOTE — Progress Notes (Signed)
Gastroenterology Consultation  Referring Provider:     [name] * Primary Care Physician:  [name], DO Primary Gastroenterologist:  [name]     Reason for Consultation:     Cirrhosis        HPI:   Manuel Jimenez is a 56 y.o. y/o male referred for consultation & management of Cirrhosis by [name], [name], DO.  This patient comes to see me today after being found to have cirrhosis.  The patient was followed by Dr. Candace Cruise in the past.  The patient had a CT scan done in 2016 that showed possible liver lesions with a follow-up MRI that did not show any lesions to be suspicious of cancer.  The patient was found to be thrombocytopenic and imaging did suggest cirrhosis.  The patient had repeat imaging in 2017 that showed signs of portal hypertension with varices and a persistent portal vein thrombosis without occlusion.  The patient was also found to have significantly low iron.  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  The patient reports that he has some right-sided pain with a bulging of his right side of his abdomen.  There is no report of any alcohol use in the last 2 years.  The patient has had a normal albumin in the past.  The patient is not sure if he has been checked for hepatitis A or B immunity and denies remembering ever receiving a vaccination for these.  Past Medical History:  Diagnosis Date  . Anemia   . Cancer ([place])   . Cirrhosis ([place])   . Colon polyps   . Constipation   . Diarrhea   . Hemorrhoids   . Hypertension    in past  . Portal vein thrombosis    see chart review 06/05/15  . Stomach ulcer   . Weight loss     Past Surgical History:  Procedure Laterality Date  . COLONOSCOPY N/A 06/17/2015   Procedure: COLONOSCOPY;  Surgeon: Hulen Luster, MD;  Location: [place];  Service: Gastroenterology;  Laterality: N/A;  . ESOPHAGOGASTRODUODENOSCOPY N/A 05/14/2015   Procedure:  ESOPHAGOGASTRODUODENOSCOPY (EGD);  Surgeon: Hulen Luster, MD;  Location: [facility] ENDOSCOPY;  Service: Endoscopy;  Laterality: N/A;    Prior to Admission medications   Medication Sig Start Date End Date Taking? Authorizing Provider  loratadine (CLARITIN) 10 MG tablet Take 10 mg by mouth daily.   Yes [provider]  famotidine (PEPCID) 20 MG tablet Take 1 tablet (20 mg total) by mouth 2 (two) times daily. 06/17/16 12/09/16  Karamalegos, [name], DO  ferrous sulfate 325 (65 FE) MG tablet Take 1 tablet (325 mg total) by mouth daily with breakfast. Patient not taking: Reported on 10/19/2016 05/15/15   Loletha Grayer, MD  valACYclovir (VALTREX) 1000 MG tablet Take 1 tablet (1,000 mg total) by mouth 3 (three) times daily. For 7 days, take with food Patient not taking: Reported on 12/09/2016 10/19/16   [name], DO    Family History  Problem Relation Age of Onset  . CAD Father   . Heart attack Father      Social History  Substance Use Topics  . Smoking status: Current Every Day Smoker    Packs/day: 2.00    Years: 40.00    Types: Cigarettes  . Smokeless tobacco: Never Used     Comment: Only successfully quit for 1 month before due to acute illness  . Alcohol use No  Comment: Sober / alcohol free for >1 year.    Allergies as of 01/19/2017 - Review Complete 01/19/2017  Allergen Reaction Noted  . Lorazepam  04/06/2016    Review of Systems:    All systems reviewed and negative except where noted in HPI.   Physical Exam:  BP (!) 149/89   Pulse 82   Temp 98.7 F (37.1 C) (Oral)   Ht 5\' 11"  (1.803 m)   Wt 138 lb 8 oz (62.8 kg)   BMI 19.32 kg/m  No LMP for male patient. Psych:  Alert and cooperative. Normal mood and affect. General:   Alert,  Well-developed, well-nourished, pleasant and cooperative in NAD Head:  Normocephalic and atraumatic. Positive for temporal wasting Eyes:  Sclera clear, no icterus.   Conjunctiva pink. Ears:  Normal auditory  acuity. Nose:  No deformity, discharge, or lesions. Mouth:  No deformity or lesions,oropharynx pink & moist. Neck:  Supple; no masses or thyromegaly. Lungs:  Respirations even and unlabored.  Clear throughout to auscultation.   No wheezes, crackles, or rhonchi. No acute distress. Heart:  Regular rate and rhythm; no murmurs, clicks, rubs, or gallops. Abdomen:  Normal bowel sounds.  No bruits.  Soft, non-tender and non-distended without masses, There is splenomegaly noted and muscle weakness on the right side..  No guarding or rebound tenderness.  Negative Carnett sign.   Rectal:  Deferred.  Msk:  Symmetrical without gross deformities.  Good, equal movement & strength bilaterally. Pulses:  Normal pulses noted. Extremities:  No clubbing or edema.  No cyanosis.  Neurologic:  Alert and oriented x3;  grossly normal neurologically. Skin:  Intact without significant lesions or rashes.  No jaundice. Positive spider angiomata on the trunk Lymph Nodes:  No significant cervical adenopathy. Psych:  Alert and cooperative. Normal mood and affect.  Imaging Studies: No results found.  Assessment and Plan:   Manuel Jimenez is a 56 y.o. y/o male Who has a history of alcoholic cirrhosis.  The patient no longer drinks.  The patient has not undergone any surveillance for [facility].  The patient also does not have any recent labs.  The patient will have his blood sent off for repeat liver enzymes and a CBC.  The patient has significant splenomegaly which could account for his low platelets.  The patient also has physical signs of cirrhosis with spider angiomata and temporal wasting.  The patient will have his blood checked for immunity for hepatitis A and B and he will be vaccinated accordingly.  The patient will also be set up for a right upper quadrant ultrasound.  The patient has been explained the plan and agrees with it.  [name], MD. Marval Regal   Note: This dictation was prepared with Dragon dictation along with  smaller phrase technology. Any transcriptional errors that result from this process are unintentional.

## 2017-01-19 NOTE — Patient Instructions (Signed)
You are scheduled for a RUQ abdominal US at [facility] location on Wednesday, June 27th @ 8:00am. Please arrive at 7:45am. You cannot have anything to eat or drink after midnight Tuesday night.   If you need to reschedule for any reason, please call central scheduling at [phone].

## 2017-01-20 ENCOUNTER — Telehealth: Payer: Self-pay

## 2017-01-20 LAB — HEPATITIS B SURFACE ANTIBODY,QUALITATIVE: HEP B S AB: NONREACTIVE

## 2017-01-20 LAB — HEPATITIS A ANTIBODY, TOTAL: HEP A TOTAL AB: NEGATIVE

## 2017-01-20 LAB — HEPATITIS B SURFACE ANTIGEN: HEP B S AG: NEGATIVE

## 2017-01-20 LAB — AFP TUMOR MARKER: AFP TUMOR MARKER: 1.8 ng/mL (ref 0.0–8.3)

## 2017-01-20 NOTE — Telephone Encounter (Signed)
-----   Message from [name], MD sent at 01/20/2017 12:39 PM EDT ----- Let the patient know that his tumor marker for liver cancer was negative. He is also not immune to hepatitis A or B and needs vaccinations for these. His liver enzymes are normal and his albumin has come up. The patient's anemia has also resolved with his hemoglobin being 15 up from 11 2 months ago

## 2017-01-20 NOTE — Telephone Encounter (Signed)
LVM for pt to return my call.

## 2017-01-20 NOTE — Telephone Encounter (Signed)
Pt returned call and notified of lab results.

## 2017-01-24 ENCOUNTER — Encounter: Payer: Self-pay | Admitting: *Deleted

## 2017-01-25 ENCOUNTER — Ambulatory Visit
Admission: RE | Admit: 2017-01-25 | Discharge: 2017-01-25 | Disposition: A | Discharge status: Home / Self Care | Payer: 59 | Source: Ambulatory Visit | Attending: Gastroenterology | Admitting: Gastroenterology

## 2017-01-25 DIAGNOSIS — K7460 Unspecified cirrhosis of liver: Secondary | ICD-10-CM | POA: Diagnosis not present

## 2017-01-25 DIAGNOSIS — I81 Portal vein thrombosis: Secondary | ICD-10-CM | POA: Insufficient documentation

## 2017-01-25 DIAGNOSIS — K824 Cholesterolosis of gallbladder: Secondary | ICD-10-CM | POA: Insufficient documentation

## 2017-01-27 NOTE — Discharge Instructions (Signed)
General Anesthesia, Adult, Care After °These instructions provide you with information about caring for yourself after your procedure. Your health care provider may also give you more specific instructions. Your treatment has been planned according to current medical practices, but problems sometimes occur. Call your health care provider if you have any problems or questions after your procedure. °What can I expect after the procedure? °After the procedure, it is common to have: °· Vomiting. °· A sore throat. °· Mental slowness. ° °It is common to feel: °· Nauseous. °· Cold or shivery. °· Sleepy. °· Tired. °· Sore or achy, even in parts of your body where you did not have surgery. ° °Follow these instructions at home: °For at least 24 hours after the procedure: °· Do not: °? Participate in activities where you could fall or become injured. °? Drive. °? Use heavy machinery. °? Drink alcohol. °? Take sleeping pills or medicines that cause drowsiness. °? Make important decisions or sign legal documents. °? Take care of children on your own. °· Rest. °Eating and drinking °· If you vomit, drink water, juice, or soup when you can drink without vomiting. °· Drink enough fluid to keep your urine clear or pale yellow. °· Make sure you have little or no nausea before eating solid foods. °· Follow the diet recommended by your health care provider. °General instructions °· Have a responsible adult stay with you until you are awake and alert. °· Return to your normal activities as told by your health care provider. Ask your health care provider what activities are safe for you. °· Take over-the-counter and prescription medicines only as told by your health care provider. °· If you smoke, do not smoke without supervision. °· Keep all follow-up visits as told by your health care provider. This is important. °Contact a health care provider if: °· You continue to have nausea or vomiting at home, and medicines are not helpful. °· You  cannot drink fluids or start eating again. °· You cannot urinate after 8-12 hours. °· You develop a skin rash. °· You have fever. °· You have increasing redness at the site of your procedure. °Get help right away if: °· You have difficulty breathing. °· You have chest pain. °· You have unexpected bleeding. °· You feel that you are having a life-threatening or urgent problem. °This information is not intended to replace advice given to you by your health care provider. Make sure you discuss any questions you have with your health care provider. °Document Released: 10/24/2000 Document Revised: 12/21/2015 Document Reviewed: 07/02/2015 °Elsevier Interactive Patient Education © 2018 [employer] ° °

## 2017-01-31 ENCOUNTER — Inpatient Hospital Stay: Payer: 59 | Attending: Oncology

## 2017-01-31 ENCOUNTER — Other Ambulatory Visit: Payer: Self-pay | Admitting: Family Medicine

## 2017-01-31 DIAGNOSIS — K219 Gastro-esophageal reflux disease without esophagitis: Secondary | ICD-10-CM

## 2017-01-31 DIAGNOSIS — K7460 Unspecified cirrhosis of liver: Secondary | ICD-10-CM | POA: Diagnosis not present

## 2017-01-31 DIAGNOSIS — Z86010 Personal history of colonic polyps: Secondary | ICD-10-CM | POA: Insufficient documentation

## 2017-01-31 DIAGNOSIS — F17210 Nicotine dependence, cigarettes, uncomplicated: Secondary | ICD-10-CM | POA: Diagnosis not present

## 2017-01-31 DIAGNOSIS — I10 Essential (primary) hypertension: Secondary | ICD-10-CM | POA: Insufficient documentation

## 2017-01-31 DIAGNOSIS — D500 Iron deficiency anemia secondary to blood loss (chronic): Secondary | ICD-10-CM

## 2017-01-31 DIAGNOSIS — I81 Portal vein thrombosis: Secondary | ICD-10-CM | POA: Diagnosis not present

## 2017-01-31 DIAGNOSIS — K824 Cholesterolosis of gallbladder: Secondary | ICD-10-CM | POA: Insufficient documentation

## 2017-01-31 DIAGNOSIS — D696 Thrombocytopenia, unspecified: Secondary | ICD-10-CM | POA: Insufficient documentation

## 2017-01-31 DIAGNOSIS — I8500 Esophageal varices without bleeding: Secondary | ICD-10-CM | POA: Diagnosis not present

## 2017-01-31 DIAGNOSIS — D509 Iron deficiency anemia, unspecified: Secondary | ICD-10-CM | POA: Diagnosis not present

## 2017-01-31 LAB — CBC
HEMATOCRIT: 44.8 % (ref 40.0–52.0)
HEMOGLOBIN: 15 g/dL (ref 13.0–18.0)
MCH: 29.3 pg (ref 26.0–34.0)
MCHC: 33.6 g/dL (ref 32.0–36.0)
MCV: 87.2 fL (ref 80.0–100.0)
Platelets: 78 10*3/uL — ABNORMAL LOW (ref 150–440)
RBC: 5.14 MIL/uL (ref 4.40–5.90)
RDW: 18.1 % — ABNORMAL HIGH (ref 11.5–14.5)
WBC: 9.4 10*3/uL (ref 3.8–10.6)

## 2017-01-31 LAB — IRON AND TIBC
IRON: 46 ug/dL (ref 45–182)
Saturation Ratios: 14 % — ABNORMAL LOW (ref 17.9–39.5)
TIBC: 321 ug/dL (ref 250–450)
UIBC: 275 ug/dL

## 2017-01-31 LAB — FERRITIN: Ferritin: 50 ng/mL (ref 24–336)

## 2017-01-31 NOTE — Progress Notes (Signed)
[place]  [phone]) [phone] [phone]) [phone]  ID: Manuel Jimenez OB: Nov 18, 1960  MR#: [id number]  QZE#:[id number]  Patient Care Team: [name], DO as PCP - General (Family Medicine)  CHIEF COMPLAINT: Iron deficiency anemia, thrombocytopenia  INTERVAL HISTORY: Patient returns to clinic today for repeat laboratory work and further evaluation. He had a colonoscopy and EGD yesterday. He currently feels well and is asymptomatic. He does not complain of easy bleeding or bruising. He does not complain of weakness or fatigue. He has no neurologic complaints. He denies any recent fevers or illnesses. He has a good appetite and denies weight loss. He denies any chest pain or shortness of breath. He denies any nausea, vomiting, constipation, or diarrhea. He denies any melena or hematochezia. He has no urinary complaints. Patient offers no specific complaints today.  REVIEW OF SYSTEMS:   Review of Systems  Constitutional: Negative.  Negative for fever, malaise/fatigue and weight loss.  Respiratory: Negative.  Negative for cough, hemoptysis and shortness of breath.   Cardiovascular: Negative.  Negative for chest pain and leg swelling.  Gastrointestinal: Negative.  Negative for abdominal pain, blood in stool and melena.  Genitourinary: Negative.   Musculoskeletal: Negative.   Skin: Negative.  Negative for rash.  Neurological: Negative.  Negative for sensory change and weakness.  Psychiatric/Behavioral: Negative.  The patient is not nervous/anxious.     As per HPI. Otherwise, a complete review of systems is negative.  PAST MEDICAL HISTORY: Past Medical History:  Diagnosis Date  . Anemia   . Cancer ([place])   . Cirrhosis ([place])   . Colon polyps   . Constipation   . Diarrhea   . Hemorrhoids   . Hypertension    in past  . Portal vein thrombosis    see chart review 06/05/15  . Shingles 09/2016  . Stomach ulcer   . Weight loss     PAST SURGICAL  HISTORY: Past Surgical History:  Procedure Laterality Date  . COLONOSCOPY N/A 06/17/2015   Procedure: COLONOSCOPY;  Surgeon: Hulen Luster, MD;  Location: [place];  Service: Gastroenterology;  Laterality: N/A;  . COLONOSCOPY WITH PROPOFOL N/A 02/02/2017   Procedure: COLONOSCOPY WITH PROPOFOL;  Surgeon: [name], MD;  Location: [facility];  Service: Endoscopy;  Laterality: N/A;  . ESOPHAGOGASTRODUODENOSCOPY N/A 05/14/2015   Procedure: ESOPHAGOGASTRODUODENOSCOPY (EGD);  Surgeon: Hulen Luster, MD;  Location: [facility] ENDOSCOPY;  Service: Endoscopy;  Laterality: N/A;  . ESOPHAGOGASTRODUODENOSCOPY (EGD) WITH PROPOFOL N/A 02/02/2017   Procedure: ESOPHAGOGASTRODUODENOSCOPY (EGD) WITH PROPOFOL;  Surgeon: [name], MD;  Location: [place];  Service: Endoscopy;  Laterality: N/A;  . POLYPECTOMY  02/02/2017   Procedure: POLYPECTOMY;  Surgeon: [name], MD;  Location: [place];  Service: Endoscopy;;    FAMILY HISTORY: Family History  Problem Relation Age of Onset  . CAD Father   . Heart attack Father     ADVANCED DIRECTIVES (Y/N):  N  HEALTH MAINTENANCE: Social History  Substance Use Topics  . Smoking status: Current Every Day Smoker    Packs/day: 2.00    Years: 40.00    Types: Cigarettes  . Smokeless tobacco: Never Used     Comment: Only successfully quit for 1 month before due to acute illness. (01/24/17 - down to 1.5 PPD)  . 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     Colonoscopy:  PAP:  Bone density:  Lipid panel:  Allergies  Allergen Reactions  .  Iron Nausea Only  . Lorazepam     "made me anxious and mean"     Current Outpatient Prescriptions  Medication Sig Dispense Refill  . famotidine (PEPCID) 20 MG tablet Take 1 tablet (20 mg total) by mouth 2 (two) times daily. 60 tablet 0  . loratadine (CLARITIN) 10 MG tablet Take 10 mg by mouth daily.    . IRON DEXTRAN IJ Inject as directed.     No current  facility-administered medications for this visit.     OBJECTIVE: Vitals:   02/03/17 1058  BP: (!) 154/83  Pulse: 73  Resp: 20  Temp: (!) 96 F (35.6 C)     Body mass index is 19.17 kg/m.    ECOG FS:0 - Asymptomatic  General: Well-developed, well-nourished, no acute distress. Eyes: Pink conjunctiva, anicteric sclera. Lungs: Clear to auscultation bilaterally. Heart: Regular rate and rhythm. No rubs, murmurs, or gallops. Abdomen: Soft, nontender, nondistended. No organomegaly noted, normoactive bowel sounds. Musculoskeletal: No edema, cyanosis, or clubbing. Neuro: Alert, answering all questions appropriately. Cranial nerves grossly intact. Skin: No rashes or petechiae noted. Psych: Normal affect.   LAB RESULTS:  Lab Results  Component Value Date   NA 141 01/19/2017   K 3.1 (L) 01/19/2017   CL 105 01/19/2017   CO2 26 01/19/2017   GLUCOSE 92 01/19/2017   BUN 8 01/19/2017   CREATININE 0.64 01/19/2017   CALCIUM 9.3 01/19/2017   PROT 7.2 01/19/2017   ALBUMIN 4.4 01/19/2017   AST 19 01/19/2017   ALT 19 01/19/2017   ALKPHOS 101 01/19/2017   BILITOT 0.8 01/19/2017   GFRNONAA >60 01/19/2017   GFRAA >60 01/19/2017    Lab Results  Component Value Date   WBC 9.4 01/31/2017   NEUTROABS 5.9 01/19/2017   HGB 15.0 01/31/2017   HCT 44.8 01/31/2017   MCV 87.2 01/31/2017   PLT 78 (L) 01/31/2017   Lab Results  Component Value Date   IRON 46 01/31/2017   TIBC 321 01/31/2017   IRONPCTSAT 14 (L) 01/31/2017   Lab Results  Component Value Date   FERRITIN 50 01/31/2017     STUDIES: US Abdomen Limited Ruq  Result Date: 01/25/2017 CLINICAL DATA:  Hepatic cirrhosis EXAM: ULTRASOUND ABDOMEN LIMITED RIGHT UPPER QUADRANT COMPARISON:  MRI of the abdomen of June 11, 2015 and right upper quadrant abdominal ultrasound of May 09, 2015. FINDINGS: Gallbladder: The gallbladder is adequately distended. There is an non mobile echogenic focus adherent to the luminal surface measuring  5 mm in diameter. There is mild gallbladder wall thickening to 5.5 mm. No echogenic mobile shadowing stones are observed. There is no positive sonographic Murphy sign. Common bile duct: Diameter: 4.4 mm Liver: The hepatic echotexture is heterogeneously increased. The surface contour of the liver is fairly smooth. There is no focal mass. No ascites is observed. There is noted to be nonocclusive thrombus in the portal vein IMPRESSION: Cirrhotic changes within the liver without evidence of discrete masses nor ascites. There is nonocclusive thrombus in the main portal vein. Gallbladder polyp but no evidence of stones or acute cholecystitis. There is mild gallbladder wall thickening to 5.5 mm which has been previously described. Electronically Signed   By: David  Martinique M.D.   On: 01/25/2017 10:44    ASSESSMENT: Iron deficiency anemia, thrombocytopenia  PLAN:    1. Iron deficiency anemia: Likely secondary to GI blood loss. Colonoscopy and EGD completed on February 02, 2017 removed 6 polyps noted esophageal varices as well as portal hypertension. Currently, his hemoglobin and  iron stores are within normal limits.  Patient cannot tolerate oral iron supplementation. He does not require additional IV iron today. Patient last received 510 mg IV Feraheme on Dec 21, 2016. Return to clinic in 4 months with repeat laboratory work and further evaluation. 2. Thrombocytopenia: Likely secondary to ongoing cirrhosis. Abdominal ultrasound on January 25, 2017 did not reports fundamentally. No intervention is needed at this time. Monitor.  3. Liver lesions: Patient was noted to have several poorly defined low-attenuation lesions in his left hepatic lobe on CT scan on June 02, 2015. No follow-up CT scan has been completed for evaluation. Patient's AFP is within normal limits. Will defer any repeat imaging to GI.  Patient expressed understanding and was in agreement with this plan. He also understands that He can call clinic at any  time with any questions, concerns, or complaints.    Manuel Huger, MD   02/03/2017 2:49 PM

## 2017-02-02 ENCOUNTER — Ambulatory Visit: Payer: Commercial Managed Care - HMO | Admitting: Anesthesiology

## 2017-02-02 ENCOUNTER — Other Ambulatory Visit: Payer: Self-pay

## 2017-02-02 ENCOUNTER — Ambulatory Visit
Admission: RE | Admit: 2017-02-02 | Discharge: 2017-02-02 | Disposition: A | Discharge status: Home / Self Care | Payer: Commercial Managed Care - HMO | Source: Ambulatory Visit | Attending: Gastroenterology | Admitting: Gastroenterology

## 2017-02-02 ENCOUNTER — Encounter
Admission: RE | Disposition: A | Discharge status: Home / Self Care | Payer: Self-pay | Source: Ambulatory Visit | Attending: Gastroenterology

## 2017-02-02 DIAGNOSIS — Z86010 Personal history of colonic polyps: Secondary | ICD-10-CM | POA: Diagnosis not present

## 2017-02-02 DIAGNOSIS — F17210 Nicotine dependence, cigarettes, uncomplicated: Secondary | ICD-10-CM | POA: Diagnosis not present

## 2017-02-02 DIAGNOSIS — I8510 Secondary esophageal varices without bleeding: Secondary | ICD-10-CM | POA: Insufficient documentation

## 2017-02-02 DIAGNOSIS — K7460 Unspecified cirrhosis of liver: Secondary | ICD-10-CM | POA: Insufficient documentation

## 2017-02-02 DIAGNOSIS — K219 Gastro-esophageal reflux disease without esophagitis: Secondary | ICD-10-CM

## 2017-02-02 DIAGNOSIS — Z888 Allergy status to other drugs, medicaments and biological substances status: Secondary | ICD-10-CM | POA: Insufficient documentation

## 2017-02-02 DIAGNOSIS — Z8719 Personal history of other diseases of the digestive system: Secondary | ICD-10-CM | POA: Diagnosis not present

## 2017-02-02 DIAGNOSIS — K5730 Diverticulosis of large intestine without perforation or abscess without bleeding: Secondary | ICD-10-CM | POA: Diagnosis not present

## 2017-02-02 DIAGNOSIS — K635 Polyp of colon: Secondary | ICD-10-CM | POA: Insufficient documentation

## 2017-02-02 DIAGNOSIS — K641 Second degree hemorrhoids: Secondary | ICD-10-CM | POA: Diagnosis not present

## 2017-02-02 DIAGNOSIS — D508 Other iron deficiency anemias: Secondary | ICD-10-CM | POA: Diagnosis not present

## 2017-02-02 DIAGNOSIS — K766 Portal hypertension: Secondary | ICD-10-CM | POA: Diagnosis not present

## 2017-02-02 DIAGNOSIS — D125 Benign neoplasm of sigmoid colon: Secondary | ICD-10-CM | POA: Diagnosis not present

## 2017-02-02 DIAGNOSIS — Z8249 Family history of ischemic heart disease and other diseases of the circulatory system: Secondary | ICD-10-CM | POA: Diagnosis not present

## 2017-02-02 DIAGNOSIS — K3189 Other diseases of stomach and duodenum: Secondary | ICD-10-CM | POA: Insufficient documentation

## 2017-02-02 DIAGNOSIS — D509 Iron deficiency anemia, unspecified: Secondary | ICD-10-CM | POA: Insufficient documentation

## 2017-02-02 DIAGNOSIS — D124 Benign neoplasm of descending colon: Secondary | ICD-10-CM | POA: Diagnosis not present

## 2017-02-02 DIAGNOSIS — Z1211 Encounter for screening for malignant neoplasm of colon: Secondary | ICD-10-CM | POA: Diagnosis not present

## 2017-02-02 HISTORY — PX: POLYPECTOMY: SHX5525

## 2017-02-02 HISTORY — DX: Zoster without complications: B02.9

## 2017-02-02 HISTORY — PX: ESOPHAGOGASTRODUODENOSCOPY (EGD) WITH PROPOFOL: SHX5813

## 2017-02-02 HISTORY — PX: COLONOSCOPY WITH PROPOFOL: SHX5780

## 2017-02-02 SURGERY — COLONOSCOPY WITH PROPOFOL
Anesthesia: General

## 2017-02-02 MED ORDER — GLYCOPYRROLATE 0.2 MG/ML IJ SOLN
INTRAMUSCULAR | Status: DC | PRN
  Administered 2017-02-02: 0.2 mg via INTRAVENOUS

## 2017-02-02 MED ORDER — ACETAMINOPHEN 325 MG PO TABS: 325.0000 mg | ORAL_TABLET | ORAL | Status: DC | PRN

## 2017-02-02 MED ORDER — ACETAMINOPHEN 160 MG/5ML PO SOLN: 325.0000 mg | ORAL | Status: DC | PRN

## 2017-02-02 MED ORDER — LACTATED RINGERS IV SOLN
INTRAVENOUS | Status: DC
  Administered 2017-02-02: 09:00:00 via INTRAVENOUS

## 2017-02-02 MED ORDER — LIDOCAINE HCL (CARDIAC) 20 MG/ML IV SOLN
INTRAVENOUS | Status: DC | PRN
  Administered 2017-02-02: 40 mg via INTRAVENOUS

## 2017-02-02 MED ORDER — FAMOTIDINE 20 MG PO TABS: 20.0000 mg | ORAL_TABLET | Freq: Two times a day (BID) | ORAL | 0 refills | Status: DC

## 2017-02-02 MED ORDER — SIMETHICONE 40 MG/0.6ML PO SUSP
ORAL | Status: DC | PRN
  Administered 2017-02-02: 09:00:00

## 2017-02-02 MED ORDER — PROPOFOL 10 MG/ML IV BOLUS
INTRAVENOUS | Status: DC | PRN
  Administered 2017-02-02: 30 mg via INTRAVENOUS
  Administered 2017-02-02: 20 mg via INTRAVENOUS
  Administered 2017-02-02 (×5): 50 mg via INTRAVENOUS
  Administered 2017-02-02: 100 mg via INTRAVENOUS
  Administered 2017-02-02 (×5): 50 mg via INTRAVENOUS

## 2017-02-02 SURGICAL SUPPLY — 35 items
BALLN DILATOR 10-12 8 (BALLOONS)
BALLN DILATOR 12-15 8 (BALLOONS)
BALLN DILATOR 15-18 8 (BALLOONS)
BALLN DILATOR CRE 0-12 8 (BALLOONS)
BALLN DILATOR ESOPH 8 10 CRE (MISCELLANEOUS) IMPLANT
BALLOON DILATOR 12-15 8 (BALLOONS) IMPLANT
BALLOON DILATOR 15-18 8 (BALLOONS) IMPLANT
BALLOON DILATOR CRE 0-12 8 (BALLOONS) IMPLANT
BLOCK BITE 60FR ADLT L/F GRN (MISCELLANEOUS) ×3 IMPLANT
CANISTER SUCT 1200ML W/VALVE (MISCELLANEOUS) ×3 IMPLANT
CLIP HMST 235XBRD CATH ROT (MISCELLANEOUS) ×4 IMPLANT
CLIP RESOLUTION 360 11X235 (MISCELLANEOUS) ×2
FCP ESCP3.2XJMB 240X2.8X (MISCELLANEOUS)
FORCEPS BIOP RAD 4 LRG CAP 4 (CUTTING FORCEPS) ×3 IMPLANT
FORCEPS BIOP RJ4 240 W/NDL (MISCELLANEOUS)
FORCEPS ESCP3.2XJMB 240X2.8X (MISCELLANEOUS) IMPLANT
GOWN CVR UNV OPN BCK APRN NK (MISCELLANEOUS) ×4 IMPLANT
GOWN ISOL THUMB LOOP REG UNIV (MISCELLANEOUS) ×2
INJECTOR VARIJECT VIN23 (MISCELLANEOUS) IMPLANT
KIT DEFENDO VALVE AND CONN (KITS) IMPLANT
KIT ENDO PROCEDURE OLY (KITS) ×3 IMPLANT
MARKER SPOT ENDO TATTOO 5ML (MISCELLANEOUS) IMPLANT
PAD GROUND ADULT SPLIT (MISCELLANEOUS) ×3 IMPLANT
PROBE APC STR FIRE (PROBE) IMPLANT
RETRIEVER NET PLAT FOOD (MISCELLANEOUS) IMPLANT
RETRIEVER NET ROTH 2.5X230 LF (MISCELLANEOUS) IMPLANT
SNARE SHORT THROW 13M SML OVAL (MISCELLANEOUS) IMPLANT
SNARE SHORT THROW 30M LRG OVAL (MISCELLANEOUS) IMPLANT
SNARE SNG USE RND 15MM (INSTRUMENTS) IMPLANT
SPOT EX ENDOSCOPIC TATTOO (MISCELLANEOUS)
SYR INFLATION 60ML (SYRINGE) IMPLANT
TRAP ETRAP POLY (MISCELLANEOUS) ×3 IMPLANT
VARIJECT INJECTOR VIN23 (MISCELLANEOUS)
WATER STERILE IRR 250ML POUR (IV SOLUTION) ×3 IMPLANT
WIRE CRE 18-20MM 8CM F G (MISCELLANEOUS) IMPLANT

## 2017-02-02 NOTE — Op Note (Signed)
[facility] Gastroenterology Patient Name: Manuel Jimenez Procedure Date: 02/02/2017 8:40 AM MRN: [id number] Account #: [id number] Date of Birth: [birth year] Admit Type: Outpatient Age: 56 Room: [facility] OR ROOM 01 Gender: Male Note Status: Finalized Procedure:            Upper GI endoscopy Indications:          Iron deficiency anemia Providers:            [name] MD, MD Referring MD:         [name] (Referring MD) Medicines:            Propofol per Anesthesia Complications:        No immediate complications. Procedure:            Pre-Anesthesia Assessment:                       - Prior to the procedure, a History and Physical was                        performed, and patient medications and allergies were                        reviewed. The patient's tolerance of previous                        anesthesia was also reviewed. The risks and benefits of                        the procedure and the sedation options and risks were                        discussed with the patient. All questions were                        answered, and informed consent was obtained. Prior                        Anticoagulants: The patient has taken no previous                        anticoagulant or antiplatelet agents. ASA Grade                        Assessment: II - A patient with mild systemic disease.                        After reviewing the risks and benefits, the patient was                        deemed in satisfactory condition to undergo the                        procedure.                       After obtaining informed consent, the endoscope was                        passed under direct vision. Throughout the procedure,  the patient's blood pressure, pulse, and oxygen                        saturations were monitored continuously. The Olympus                        GIF H180J Endoscope (S#: [id number]) was introduced     through the mouth, and advanced to the second part of                        duodenum. The upper GI endoscopy was accomplished                        without difficulty. The patient tolerated the procedure                        well. Findings:      Grade I varices were found in the lower third of the esophagus.      Moderate portal hypertensive gastropathy was found in the entire       examined stomach.      The examined duodenum was normal. Impression:           - Grade I esophageal varices.                       - Portal hypertensive gastropathy.                       - Normal examined duodenum.                       - No specimens collected. Recommendation:       - Discharge patient to home.                       - Resume previous diet.                       - Continue present medications.                       - Await pathology results.                       - Perform a colonoscopy today. Procedure Code(s):    --- Professional ---                       [phone], Esophagogastroduodenoscopy, flexible, transoral;                        diagnostic, including collection of specimen(s) by                        brushing or washing, when performed (separate procedure) Diagnosis Code(s):    --- Professional ---                       D50.9, Iron deficiency anemia, unspecified                       I85.00, Esophageal varices without bleeding  K76.6, Portal hypertension CPT copyright 2016 American Medical Association. All rights reserved. The codes documented in this report are preliminary and upon coder review may  be revised to meet current compliance requirements. [name] MD, MD 02/02/2017 8:59:39 AM This report has been signed electronically. Number of Addenda: 0 Note Initiated On: 02/02/2017 8:40 AM      [facility]

## 2017-02-02 NOTE — Anesthesia Postprocedure Evaluation (Signed)
Anesthesia Post Note  Patient: Manuel Jimenez  Procedure(s) Performed: Procedure(s) (LRB): COLONOSCOPY WITH PROPOFOL (N/A) ESOPHAGOGASTRODUODENOSCOPY (EGD) WITH PROPOFOL (N/A) POLYPECTOMY  Patient location during evaluation: PACU Anesthesia Type: General Level of consciousness: awake and alert Pain management: pain level controlled Vital Signs Assessment: post-procedure vital signs reviewed and stable Respiratory status: spontaneous breathing, nonlabored ventilation and respiratory function stable Cardiovascular status: blood pressure returned to baseline and stable Postop Assessment: no signs of nausea or vomiting Anesthetic complications: no    Trecia Rogers

## 2017-02-02 NOTE — Anesthesia Preprocedure Evaluation (Signed)
Anesthesia Evaluation  Patient identified by MRN, date of birth, ID band Patient awake    Reviewed: Allergy & Precautions, H&P , NPO status , Patient's Chart, lab work & pertinent test results, reviewed documented beta blocker date and time   Airway Mallampati: I  TM Distance: >3 FB Neck ROM: full    Dental no notable dental hx.    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   Endo/Other  negative endocrine ROS  Renal/GU Hx hypokalemia, 3.1 on 6/21  negative genitourinary   Musculoskeletal   Abdominal   Peds  Hematology  (+) anemia , Hct good now at 44.8 Plt count 78K   Anesthesia Other Findings   Reproductive/Obstetrics negative OB ROS                             Anesthesia Physical Anesthesia Plan  ASA: III  Anesthesia Plan: General   Post-op Pain Management:    Induction:   PONV Risk Score and Plan:   Airway Management Planned:   Additional Equipment:   Intra-op Plan:   Post-operative Plan:   Informed Consent: I have reviewed the patients History and Physical, chart, labs and discussed the procedure including the risks, benefits and alternatives for the proposed anesthesia with the patient or authorized representative who has indicated his/her understanding and acceptance.   Dental Advisory Given  Plan Discussed with: CRNA and Anesthesiologist  Anesthesia Plan Comments:         Anesthesia Quick Evaluation

## 2017-02-02 NOTE — H&P (Signed)
Manuel Lame, MD [facility] [address], [place], [place] [address] Phone:[phone] Fax : [phone]  Primary Care Physician:  [name], DO Primary Gastroenterologist:  [name]  Pre-Procedure History & Physical: HPI:  Manuel Jimenez is a 56 y.o. male is here for an endoscopy and colonoscopy.   Past Medical History:  Diagnosis Date  . Anemia   . Cancer ([place])   . Cirrhosis ([place])   . Colon polyps   . Constipation   . Diarrhea   . Hemorrhoids   . Hypertension    in past  . Portal vein thrombosis    see chart review 06/05/15  . Shingles 09/2016  . Stomach ulcer   . Weight loss     Past Surgical History:  Procedure Laterality Date  . COLONOSCOPY N/A 06/17/2015   Procedure: COLONOSCOPY;  Surgeon: Hulen Luster, MD;  Location: [facility];  Service: Gastroenterology;  Laterality: N/A;  . ESOPHAGOGASTRODUODENOSCOPY N/A 05/14/2015   Procedure: ESOPHAGOGASTRODUODENOSCOPY (EGD);  Surgeon: Hulen Luster, MD;  Location: [facility] ENDOSCOPY;  Service: Endoscopy;  Laterality: N/A;    Prior to Admission medications   Medication Sig Start Date End Date Taking? Authorizing Provider  famotidine (PEPCID) 20 MG tablet Take 1 tablet (20 mg total) by mouth 2 (two) times daily. 06/17/16 01/24/17 Yes Karamalegos, [name], DO  IRON DEXTRAN IJ Inject as directed.   Yes [provider]  loratadine (CLARITIN) 10 MG tablet Take 10 mg by mouth daily.   Yes [provider]    Allergies as of 01/19/2017 - Review Complete 01/19/2017  Allergen Reaction Noted  . Lorazepam  04/06/2016    Family History  Problem Relation Age of Onset  . CAD Father   . Heart attack Father     Social History   Social History  . Marital status: Married    Spouse name: N/A  . Number of children: N/A  . Years of education: High School   Occupational History  . Retired    Social History Main Topics  . Smoking status: Current Every Day Smoker    Packs/day: 2.00   Years: 40.00    Types: Cigarettes  . Smokeless tobacco: Never Used     Comment: Only successfully quit for 1 month before due to acute illness. (01/24/17 - down to 1.5 PPD)  . 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  . Drug use: No  . Sexual activity: Not on file   Other Topics Concern  . Not on file   Social History Narrative  . No narrative on file    Review of Systems: See HPI, otherwise negative ROS  Physical Exam: BP (!) 145/79   Pulse 81   Temp (!) 97.3 F (36.3 C)   Resp 16   Ht 5\' 10"  (1.778 m)   Wt 131 lb (59.4 kg)   SpO2 100%   BMI 18.80 kg/m  General:   Alert,  pleasant and cooperative in NAD Head:  Normocephalic and atraumatic. Neck:  Supple; no masses or thyromegaly. Lungs:  Clear throughout to auscultation.    Heart:  Regular rate and rhythm. Abdomen:  Soft, nontender and nondistended. Normal bowel sounds, without guarding, and without rebound.   Neurologic:  Alert and  oriented x4;  grossly normal neurologically.  Impression/Plan: Manuel Jimenez is here for an endoscopy and colonoscopy to be performed for IDA.  Risks, benefits, limitations, and alternatives regarding  endoscopy and colonoscopy  have been reviewed with the patient.  Questions have been answered.  All parties agreeable.   Manuel Lame, MD  02/02/2017, 8:16 AM

## 2017-02-02 NOTE — Op Note (Signed)
[facility] Gastroenterology Patient Name: Manuel Jimenez Procedure Date: 02/02/2017 8:46 AM MRN: [id number] Account #: [id number] Date of Birth: June 06, 1961 Admit Type: Outpatient Age: 56 Room: [facility] OR ROOM 01 Gender: Male Note Status: Finalized Procedure:            Colonoscopy Indications:          Iron deficiency anemia Providers:            [name] MD, MD Medicines:            Propofol per Anesthesia Complications:        No immediate complications. Procedure:            Pre-Anesthesia Assessment:                       - Prior to the procedure, a History and Physical was                        performed, and patient medications and allergies were                        reviewed. The patient's tolerance of previous                        anesthesia was also reviewed. The risks and benefits of                        the procedure and the sedation options and risks were                        discussed with the patient. All questions were                        answered, and informed consent was obtained. Prior                        Anticoagulants: The patient has taken no previous                        anticoagulant or antiplatelet agents. ASA Grade                        Assessment: II - A patient with mild systemic disease.                        After reviewing the risks and benefits, the patient was                        deemed in satisfactory condition to undergo the                        procedure.                       After obtaining informed consent, the colonoscope was                        passed under direct vision. Throughout the procedure,                        the patient's blood pressure, pulse, and  oxygen                        saturations were monitored continuously. The [place] [id number]) was introduced through the                        anus and advanced to the the cecum, identified by                 appendiceal orifice and ileocecal valve. The                        colonoscopy was performed without difficulty. The                        patient tolerated the procedure well. The quality of                        the bowel preparation was excellent. Findings:      The perianal and digital rectal examinations were normal.      A 3 mm polyp was found in the descending colon. The polyp was sessile.       The polyp was removed with a cold biopsy forceps. Resection and       retrieval were complete. To prevent bleeding post-intervention, one       hemostatic clip was successfully placed (MR conditional). There was no       bleeding at the end of the procedure.      A 10 mm polyp was found in the sigmoid colon. The polyp was sessile. The       polyp was removed with a hot snare. Resection and retrieval were       complete. To prevent bleeding post-intervention, one hemostatic clip was       successfully placed (MR conditional). There was no bleeding at the end       of the procedure.      Four sessile polyps were found in the sigmoid colon. The polyps were 5       to 6 mm in size. These polyps were removed with a cold snare. Resection       and retrieval were complete.      Multiple small-mouthed diverticula were found in the sigmoid colon.      Non-bleeding internal hemorrhoids were found during retroflexion. The       hemorrhoids were Grade II (internal hemorrhoids that prolapse but reduce       spontaneously). Impression:           - One 3 mm polyp in the descending colon, removed with                        a cold biopsy forceps. Resected and retrieved. Clip (MR                        conditional) was placed.                       - One 10 mm polyp in the sigmoid colon, removed with a  hot snare. Resected and retrieved. Clip (MR                        conditional) was placed.                       - Four 5 to 6 mm polyps in the sigmoid colon, removed                         with a cold snare. Resected and retrieved.                       - Diverticulosis in the sigmoid colon.                       - Non-bleeding internal hemorrhoids. Recommendation:       - Discharge patient to home.                       - Resume previous diet.                       - Continue present medications.                       - Await pathology results.                       - Repeat colonoscopy in 5 years if polyp adenoma and 10                        years if hyperplastic Procedure Code(s):    --- Professional ---                       [phone], Colonoscopy, flexible; with removal of tumor(s),                        polyp(s), or other lesion(s) by snare technique                       45380, 39, Colonoscopy, flexible; with biopsy, single                        or multiple Diagnosis Code(s):    --- Professional ---                       D50.9, Iron deficiency anemia, unspecified                       D12.4, Benign neoplasm of descending colon                       D12.5, Benign neoplasm of sigmoid colon CPT copyright 2016 American Medical Association. All rights reserved. The codes documented in this report are preliminary and upon coder review may  be revised to meet current compliance requirements. [name] MD, MD 02/02/2017 9:31:18 AM This report has been signed electronically. Number of Addenda: 0 Note Initiated On: 02/02/2017 8:46 AM Scope Withdrawal Time: 0 hours 22 minutes 48 seconds  Total Procedure Duration: 0 hours 25 minutes 37 seconds       [facility]

## 2017-02-02 NOTE — Anesthesia Procedure Notes (Signed)
Procedure Name: MAC Date/Time: 02/02/2017 8:49 AM Performed by: Janna Arch Pre-anesthesia Checklist: Patient identified, Emergency Drugs available, Suction available and Patient being monitored Patient Re-evaluated:Patient Re-evaluated prior to inductionOxygen Delivery Method: Nasal cannula

## 2017-02-02 NOTE — Transfer of Care (Signed)
Immediate Anesthesia Transfer of Care Note  Patient: Manuel Jimenez  Procedure(s) Performed: Procedure(s): COLONOSCOPY WITH PROPOFOL (N/A) ESOPHAGOGASTRODUODENOSCOPY (EGD) WITH PROPOFOL (N/A) POLYPECTOMY  Patient Location: PACU  Anesthesia Type: General  Level of Consciousness: awake, alert  and patient cooperative  Airway and Oxygen Therapy: Patient Spontanous Breathing and Patient connected to supplemental oxygen  Post-op Assessment: Post-op Vital signs reviewed, Patient's Cardiovascular Status Stable, Respiratory Function Stable, Patent Airway and No signs of Nausea or vomiting  Post-op Vital Signs: Reviewed and stable  Complications: No apparent anesthesia complications

## 2017-02-03 ENCOUNTER — Encounter: Payer: Self-pay | Admitting: Gastroenterology

## 2017-02-03 ENCOUNTER — Inpatient Hospital Stay: Payer: 59

## 2017-02-03 ENCOUNTER — Inpatient Hospital Stay (HOSPITAL_BASED_OUTPATIENT_CLINIC_OR_DEPARTMENT_OTHER): Payer: 59 | Admitting: Oncology

## 2017-02-03 VITALS — BP 154/83 | HR 73 | Temp 96.0°F | Resp 20 | Wt 133.6 lb

## 2017-02-03 DIAGNOSIS — D696 Thrombocytopenia, unspecified: Secondary | ICD-10-CM | POA: Diagnosis not present

## 2017-02-03 DIAGNOSIS — Z79899 Other long term (current) drug therapy: Secondary | ICD-10-CM

## 2017-02-03 DIAGNOSIS — D509 Iron deficiency anemia, unspecified: Secondary | ICD-10-CM | POA: Diagnosis not present

## 2017-02-03 DIAGNOSIS — F17210 Nicotine dependence, cigarettes, uncomplicated: Secondary | ICD-10-CM

## 2017-02-03 DIAGNOSIS — D500 Iron deficiency anemia secondary to blood loss (chronic): Secondary | ICD-10-CM

## 2017-02-03 NOTE — Progress Notes (Signed)
Patient denies any concerns today.  

## 2017-02-06 ENCOUNTER — Encounter: Payer: Self-pay | Admitting: Gastroenterology

## 2017-02-14 ENCOUNTER — Telehealth: Payer: Self-pay

## 2017-02-14 NOTE — Telephone Encounter (Signed)
-----   Message from [name], MD sent at 01/31/2017  9:48 PM EDT ----- Let the patient know that the ultrasound did not show any masses or cancers.  Repeat ultrasound and alpha-fetoprotein in 6 months.

## 2017-02-14 NOTE — Telephone Encounter (Signed)
Left vm letting pt know results of [place].

## 2017-06-07 ENCOUNTER — Other Ambulatory Visit: Payer: 59

## 2017-06-09 ENCOUNTER — Ambulatory Visit: Payer: 59 | Admitting: Oncology

## 2017-06-09 ENCOUNTER — Ambulatory Visit: Payer: 59

## 2017-06-13 ENCOUNTER — Other Ambulatory Visit: Payer: Self-pay | Admitting: Oncology

## 2017-06-14 ENCOUNTER — Inpatient Hospital Stay: Payer: 59 | Attending: Oncology

## 2017-06-14 DIAGNOSIS — I8500 Esophageal varices without bleeding: Secondary | ICD-10-CM | POA: Insufficient documentation

## 2017-06-14 DIAGNOSIS — D509 Iron deficiency anemia, unspecified: Secondary | ICD-10-CM | POA: Insufficient documentation

## 2017-06-14 DIAGNOSIS — K766 Portal hypertension: Secondary | ICD-10-CM | POA: Insufficient documentation

## 2017-06-14 DIAGNOSIS — D696 Thrombocytopenia, unspecified: Secondary | ICD-10-CM | POA: Diagnosis not present

## 2017-06-14 DIAGNOSIS — Z79899 Other long term (current) drug therapy: Secondary | ICD-10-CM | POA: Diagnosis not present

## 2017-06-14 DIAGNOSIS — F17210 Nicotine dependence, cigarettes, uncomplicated: Secondary | ICD-10-CM | POA: Insufficient documentation

## 2017-06-14 DIAGNOSIS — I10 Essential (primary) hypertension: Secondary | ICD-10-CM | POA: Diagnosis not present

## 2017-06-14 DIAGNOSIS — D500 Iron deficiency anemia secondary to blood loss (chronic): Secondary | ICD-10-CM

## 2017-06-14 DIAGNOSIS — K7460 Unspecified cirrhosis of liver: Secondary | ICD-10-CM | POA: Diagnosis not present

## 2017-06-14 DIAGNOSIS — Z86010 Personal history of colonic polyps: Secondary | ICD-10-CM | POA: Insufficient documentation

## 2017-06-14 LAB — IRON AND TIBC
IRON: 44 ug/dL — AB (ref 45–182)
SATURATION RATIOS: 10 % — AB (ref 17.9–39.5)
TIBC: 463 ug/dL — AB (ref 250–450)
UIBC: 419 ug/dL

## 2017-06-14 LAB — CBC WITH DIFFERENTIAL/PLATELET
BASOS ABS: 0.1 10*3/uL (ref 0–0.1)
BASOS PCT: 1 %
EOS ABS: 0.1 10*3/uL (ref 0–0.7)
EOS PCT: 1 %
HEMATOCRIT: 38.7 % — AB (ref 40.0–52.0)
Hemoglobin: 12.7 g/dL — ABNORMAL LOW (ref 13.0–18.0)
Lymphocytes Relative: 14 %
Lymphs Abs: 0.9 10*3/uL — ABNORMAL LOW (ref 1.0–3.6)
MCH: 27.9 pg (ref 26.0–34.0)
MCHC: 32.8 g/dL (ref 32.0–36.0)
MCV: 85.2 fL (ref 80.0–100.0)
MONO ABS: 0.6 10*3/uL (ref 0.2–1.0)
MONOS PCT: 8 %
Neutro Abs: 4.9 10*3/uL (ref 1.4–6.5)
Neutrophils Relative %: 76 %
PLATELETS: 90 10*3/uL — AB (ref 150–440)
RBC: 4.54 MIL/uL (ref 4.40–5.90)
RDW: 17.6 % — AB (ref 11.5–14.5)
WBC: 6.6 10*3/uL (ref 3.8–10.6)

## 2017-06-14 LAB — RETICULOCYTES
RBC.: 4.59 MIL/uL (ref 4.40–5.90)
Retic Count, Absolute: 78 10*3/uL (ref 19.0–183.0)
Retic Ct Pct: 1.7 % (ref 0.4–3.1)

## 2017-06-14 LAB — FOLATE: FOLATE: 10.2 ng/mL (ref >5.9)

## 2017-06-14 LAB — VITAMIN B12: Vitamin B-12: 336 pg/mL (ref 180–914)

## 2017-06-14 LAB — LACTATE DEHYDROGENASE: LDH: 148 U/L (ref 98–192)

## 2017-06-14 LAB — FERRITIN: FERRITIN: 6 ng/mL — AB (ref 24–336)

## 2017-06-14 LAB — DAT, POLYSPECIFIC AHG (ARMC ONLY): POLYSPECIFIC AHG TEST: NEGATIVE

## 2017-06-14 NOTE — Progress Notes (Signed)
[facility]  Telephone:(336) [phone] Fax:(336) [phone]  ID: [name] OB: 1961/01/04  MR#: [id number]  OAC#:[id number]  Patient Care Team: [name], DO as PCP - General (Family Medicine)  CHIEF COMPLAINT: Iron deficiency anemia, thrombocytopenia  INTERVAL HISTORY: Patient returns to clinic today for repeat laboratory work, further evaluation, and consideration of IV Feraheme. He currently feels well and is asymptomatic. He does not complain of easy bleeding or bruising. He does not complain of weakness or fatigue. He has no neurologic complaints. He denies any recent fevers or illnesses. He has a good appetite and denies weight loss. He denies any chest pain or shortness of breath. He denies any nausea, vomiting, constipation, or diarrhea. He denies any melena or hematochezia. He has no urinary complaints. Patient offers no specific complaints today.  REVIEW OF SYSTEMS:   Review of Systems  Constitutional: Negative.  Negative for fever, malaise/fatigue and weight loss.  Respiratory: Negative.  Negative for cough, hemoptysis and shortness of breath.   Cardiovascular: Negative.  Negative for chest pain and leg swelling.  Gastrointestinal: Negative.  Negative for abdominal pain, blood in stool and melena.  Genitourinary: Negative.   Musculoskeletal: Negative.   Skin: Negative.  Negative for rash.  Neurological: Negative.  Negative for sensory change and weakness.  Psychiatric/Behavioral: Negative.  The patient is not nervous/anxious.     As per HPI. Otherwise, a complete review of systems is negative.  PAST MEDICAL HISTORY: Past Medical History:  Diagnosis Date  . Anemia   . Cancer ([place])   . Cirrhosis ([place])   . Colon polyps   . Constipation   . Diarrhea   . Hemorrhoids   . Hypertension    in past  . Portal vein thrombosis    see chart review 06/05/15  . Shingles 09/2016  . Stomach ulcer   . Weight loss     PAST SURGICAL  HISTORY: Past Surgical History:  Procedure Laterality Date  . COLONOSCOPY N/A 06/17/2015   Performed by Hulen Luster, MD at [facility]  . COLONOSCOPY WITH PROPOFOL N/A 02/02/2017   Performed by [name], MD at [facility]  . ESOPHAGOGASTRODUODENOSCOPY (EGD) N/A 05/14/2015   Performed by Hulen Luster, MD at [facility]  . ESOPHAGOGASTRODUODENOSCOPY (EGD) WITH PROPOFOL N/A 02/02/2017   Performed by [name], MD at [facility]  . POLYPECTOMY  02/02/2017   Performed by [name], MD at [facility]: Family History  Problem Relation Age of Onset  . CAD Father   . Heart attack Father     ADVANCED DIRECTIVES (Y/N):  N  HEALTH MAINTENANCE: Social History   Tobacco Use  . Smoking status: Current Every Day Smoker    Packs/day: 2.00    Years: 40.00    Pack years: 80.00    Types: Cigarettes  . Smokeless tobacco: Never Used  . Tobacco comment: Only successfully quit for 1 month before due to acute illness. (01/24/17 - down to 1.5 PPD)  Substance Use Topics  . 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  . Drug use: No     Colonoscopy:  PAP:  Bone density:  Lipid panel:  Allergies  Allergen Reactions  . Iron Nausea Only  . Lorazepam     "made me anxious and mean"     Current Outpatient Medications  Medication Sig Dispense Refill  . loratadine (CLARITIN) 10 MG tablet Take 10  mg by mouth daily.    . famotidine (PEPCID) 20 MG tablet Take 1 tablet (20 mg total) by mouth 2 (two) times daily. 60 tablet 0   No current facility-administered medications for this visit.     OBJECTIVE: Vitals:   06/16/17 1019  BP: (!) 143/87  Pulse: 66  Resp: 20  Temp: 97.8 F (36.6 C)     Body mass index is 20.15 kg/m.    ECOG FS:0 - Asymptomatic  General: Well-developed, well-nourished, no acute distress. Eyes: Pink conjunctiva, anicteric sclera. Lungs: Clear to auscultation bilaterally. Heart: Regular rate and  rhythm. No rubs, murmurs, or gallops. Abdomen: Soft, nontender, nondistended. No organomegaly noted, normoactive bowel sounds. Musculoskeletal: No edema, cyanosis, or clubbing. Neuro: Alert, answering all questions appropriately. Cranial nerves grossly intact. Skin: No rashes or petechiae noted. Psych: Normal affect.   LAB RESULTS:  Lab Results  Component Value Date   NA 141 01/19/2017   K 3.1 (L) 01/19/2017   CL 105 01/19/2017   CO2 26 01/19/2017   GLUCOSE 92 01/19/2017   BUN 8 01/19/2017   CREATININE 0.64 01/19/2017   CALCIUM 9.3 01/19/2017   PROT 7.2 01/19/2017   ALBUMIN 4.4 01/19/2017   AST 19 01/19/2017   ALT 19 01/19/2017   ALKPHOS 101 01/19/2017   BILITOT 0.8 01/19/2017   GFRNONAA >60 01/19/2017   GFRAA >60 01/19/2017    Lab Results  Component Value Date   WBC 6.6 06/14/2017   NEUTROABS 4.9 06/14/2017   HGB 12.7 (L) 06/14/2017   HCT 38.7 (L) 06/14/2017   MCV 85.2 06/14/2017   PLT 90 (L) 06/14/2017   Lab Results  Component Value Date   IRON 44 (L) 06/14/2017   TIBC 463 (H) 06/14/2017   IRONPCTSAT 10 (L) 06/14/2017   Lab Results  Component Value Date   FERRITIN 6 (L) 06/14/2017     STUDIES: No results found.  ASSESSMENT: Iron deficiency anemia, thrombocytopenia  PLAN:    1. Iron deficiency anemia: Likely secondary to GI blood loss. Colonoscopy and EGD completed on February 02, 2017 removed 6 polyps noted esophageal varices as well as portal hypertension.  His hemoglobin improved and greater than 12.0, but his iron stores continue to be decreased.  The remainder of his laboratory work is either negative or within normal limits.  Patient cannot tolerate oral iron supplementation.  Proceed with 510 mg IV Feraheme today.  Return to clinic in 2 weeks for second infusion of Feraheme and then in 4 months with repeat laboratory work and further evaluation. 2. Thrombocytopenia: Likely secondary to ongoing cirrhosis. Abdominal ultrasound on January 25, 2017 did not  report any splenomegaly. No intervention is needed at this time. Monitor.  3. Liver lesions: Patient was noted to have several poorly defined low-attenuation lesions in his left hepatic lobe on CT scan on June 02, 2015. No follow-up CT scan has been completed for evaluation. Patient's AFP is within normal limits. Will defer any repeat imaging to GI.  Patient expressed understanding and was in agreement with this plan. He also understands that He can call clinic at any time with any questions, concerns, or complaints.    Lloyd Huger, MD   06/17/2017 10:55 AM

## 2017-06-15 LAB — PROTEIN ELECTROPHORESIS, SERUM
A/G Ratio: 1.8 — ABNORMAL HIGH (ref 0.7–1.7)
Albumin ELP: 3.7 g/dL (ref 2.9–4.4)
Alpha-1-Globulin: 0.2 g/dL (ref 0.0–0.4)
Alpha-2-Globulin: 0.6 g/dL (ref 0.4–1.0)
Beta Globulin: 0.9 g/dL (ref 0.7–1.3)
GAMMA GLOBULIN: 0.4 g/dL (ref 0.4–1.8)
Globulin, Total: 2.1 g/dL — ABNORMAL LOW (ref 2.2–3.9)
TOTAL PROTEIN ELP: 5.8 g/dL — AB (ref 6.0–8.5)

## 2017-06-15 LAB — HAPTOGLOBIN: HAPTOGLOBIN: 132 mg/dL (ref 34–200)

## 2017-06-16 ENCOUNTER — Other Ambulatory Visit: Payer: Self-pay

## 2017-06-16 ENCOUNTER — Inpatient Hospital Stay: Payer: 59

## 2017-06-16 ENCOUNTER — Inpatient Hospital Stay (HOSPITAL_BASED_OUTPATIENT_CLINIC_OR_DEPARTMENT_OTHER): Payer: 59 | Admitting: Oncology

## 2017-06-16 ENCOUNTER — Encounter: Payer: Self-pay | Admitting: Oncology

## 2017-06-16 VITALS — BP 129/82 | HR 59 | Temp 97.6°F | Resp 20

## 2017-06-16 VITALS — BP 143/87 | HR 66 | Temp 97.8°F | Resp 20 | Wt 140.4 lb

## 2017-06-16 DIAGNOSIS — K766 Portal hypertension: Secondary | ICD-10-CM | POA: Diagnosis not present

## 2017-06-16 DIAGNOSIS — D500 Iron deficiency anemia secondary to blood loss (chronic): Secondary | ICD-10-CM

## 2017-06-16 DIAGNOSIS — D696 Thrombocytopenia, unspecified: Secondary | ICD-10-CM | POA: Diagnosis not present

## 2017-06-16 DIAGNOSIS — Z79899 Other long term (current) drug therapy: Secondary | ICD-10-CM | POA: Diagnosis not present

## 2017-06-16 DIAGNOSIS — K7460 Unspecified cirrhosis of liver: Secondary | ICD-10-CM

## 2017-06-16 DIAGNOSIS — F17210 Nicotine dependence, cigarettes, uncomplicated: Secondary | ICD-10-CM

## 2017-06-16 DIAGNOSIS — D509 Iron deficiency anemia, unspecified: Secondary | ICD-10-CM | POA: Diagnosis not present

## 2017-06-16 DIAGNOSIS — I8500 Esophageal varices without bleeding: Secondary | ICD-10-CM | POA: Diagnosis not present

## 2017-06-16 MED ORDER — SODIUM CHLORIDE 0.9 % IV SOLN
Freq: Once | INTRAVENOUS | Status: AC
  Administered 2017-06-16: 11:00:00 via INTRAVENOUS
  Filled 2017-06-16: qty 1000

## 2017-06-16 MED ORDER — FERUMOXYTOL INJECTION 510 MG/17 ML
510.0000 mg | Freq: Once | INTRAVENOUS | Status: AC
  Administered 2017-06-16: 510 mg via INTRAVENOUS
  Filled 2017-06-16: qty 17

## 2017-06-16 NOTE — Progress Notes (Signed)
Patient denies any concerns today.  

## 2017-06-16 NOTE — Patient Instructions (Signed)

## 2017-06-30 ENCOUNTER — Inpatient Hospital Stay: Payer: 59

## 2017-06-30 VITALS — BP 103/71 | HR 66 | Temp 98.1°F | Resp 20

## 2017-06-30 DIAGNOSIS — D500 Iron deficiency anemia secondary to blood loss (chronic): Secondary | ICD-10-CM

## 2017-06-30 DIAGNOSIS — D509 Iron deficiency anemia, unspecified: Secondary | ICD-10-CM | POA: Diagnosis not present

## 2017-06-30 MED ORDER — FERUMOXYTOL INJECTION 510 MG/17 ML
INTRAVENOUS | Status: AC
  Filled 2017-06-30: qty 17

## 2017-06-30 MED ORDER — FERUMOXYTOL INJECTION 510 MG/17 ML
510.0000 mg | Freq: Once | INTRAVENOUS | Status: AC
  Administered 2017-06-30: 510 mg via INTRAVENOUS
  Filled 2017-06-30: qty 17

## 2017-06-30 MED ORDER — SODIUM CHLORIDE 0.9 % IV SOLN
Freq: Once | INTRAVENOUS | Status: AC
  Administered 2017-06-30: 15:00:00 via INTRAVENOUS
  Filled 2017-06-30: qty 1000

## 2017-06-30 NOTE — Patient Instructions (Signed)

## 2017-08-31 ENCOUNTER — Telehealth: Payer: Self-pay

## 2017-08-31 NOTE — Telephone Encounter (Signed)
-----   Message from Glennie Isle, [place] sent at 02/14/2017 11:24 AM EDT -----   ----- Message ----- From: Glennie Isle, CMA Sent: 02/14/2017  11:23 AM To: Galen Daft [place], CMA  Pt needs repeat AFP and RUQ [place]. This is a 6 month repeat.

## 2017-08-31 NOTE — Telephone Encounter (Signed)
Left vm letting pt know he is due for his 6 months repeat US and AFP.

## 2017-09-16 DIAGNOSIS — J0100 Acute maxillary sinusitis, unspecified: Secondary | ICD-10-CM | POA: Diagnosis not present

## 2017-09-16 DIAGNOSIS — J0110 Acute frontal sinusitis, unspecified: Secondary | ICD-10-CM | POA: Diagnosis not present

## 2017-09-26 IMAGING — CR DG CHEST 1V
1 series · 2 of 2 positions shown · non-contrast
Comparison: None.

CLINICAL DATA: Fever, confusion.

EXAM:
CHEST 1 VIEW

[Series 1: ap · 0.17mm/px · 2 of 2 slices shown]
[im 1/2]
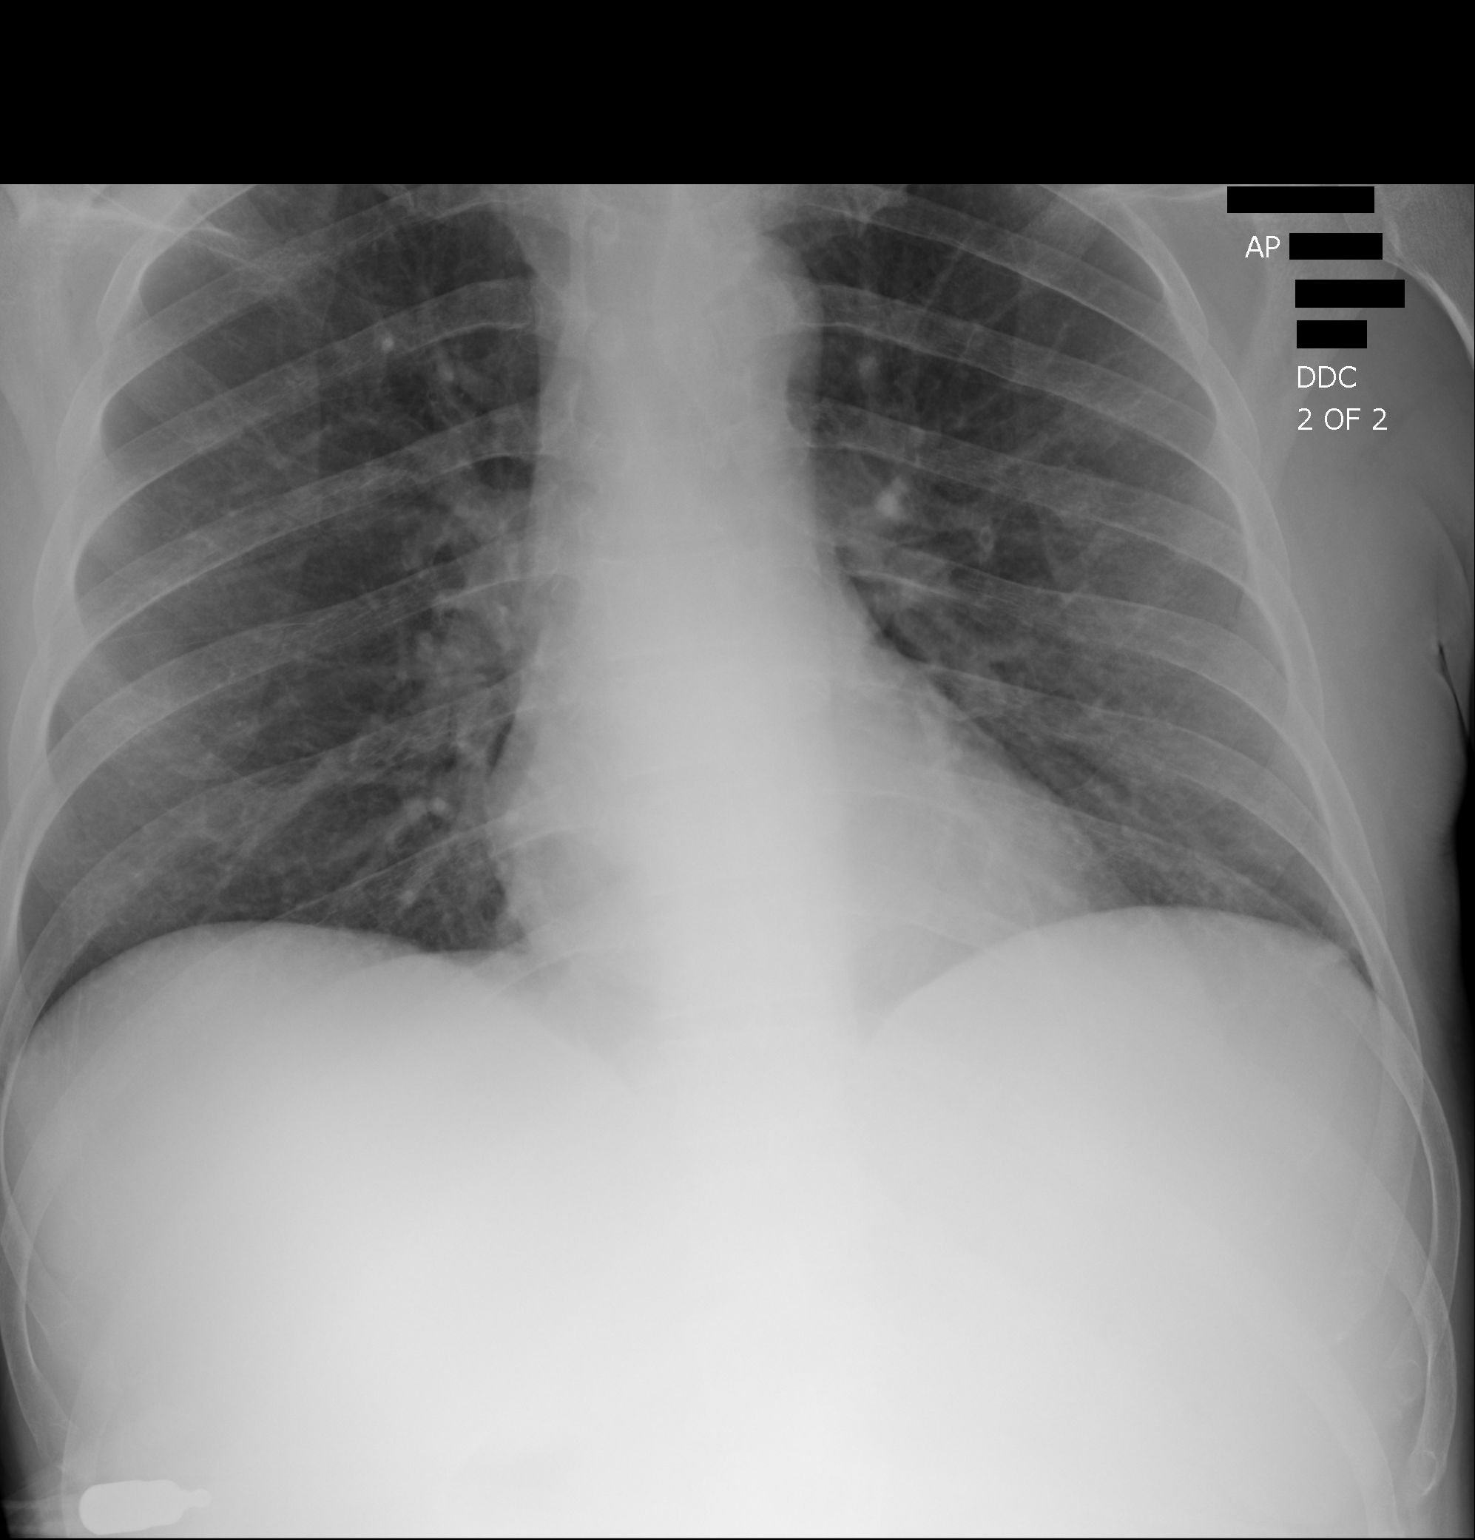
[im 2/2]
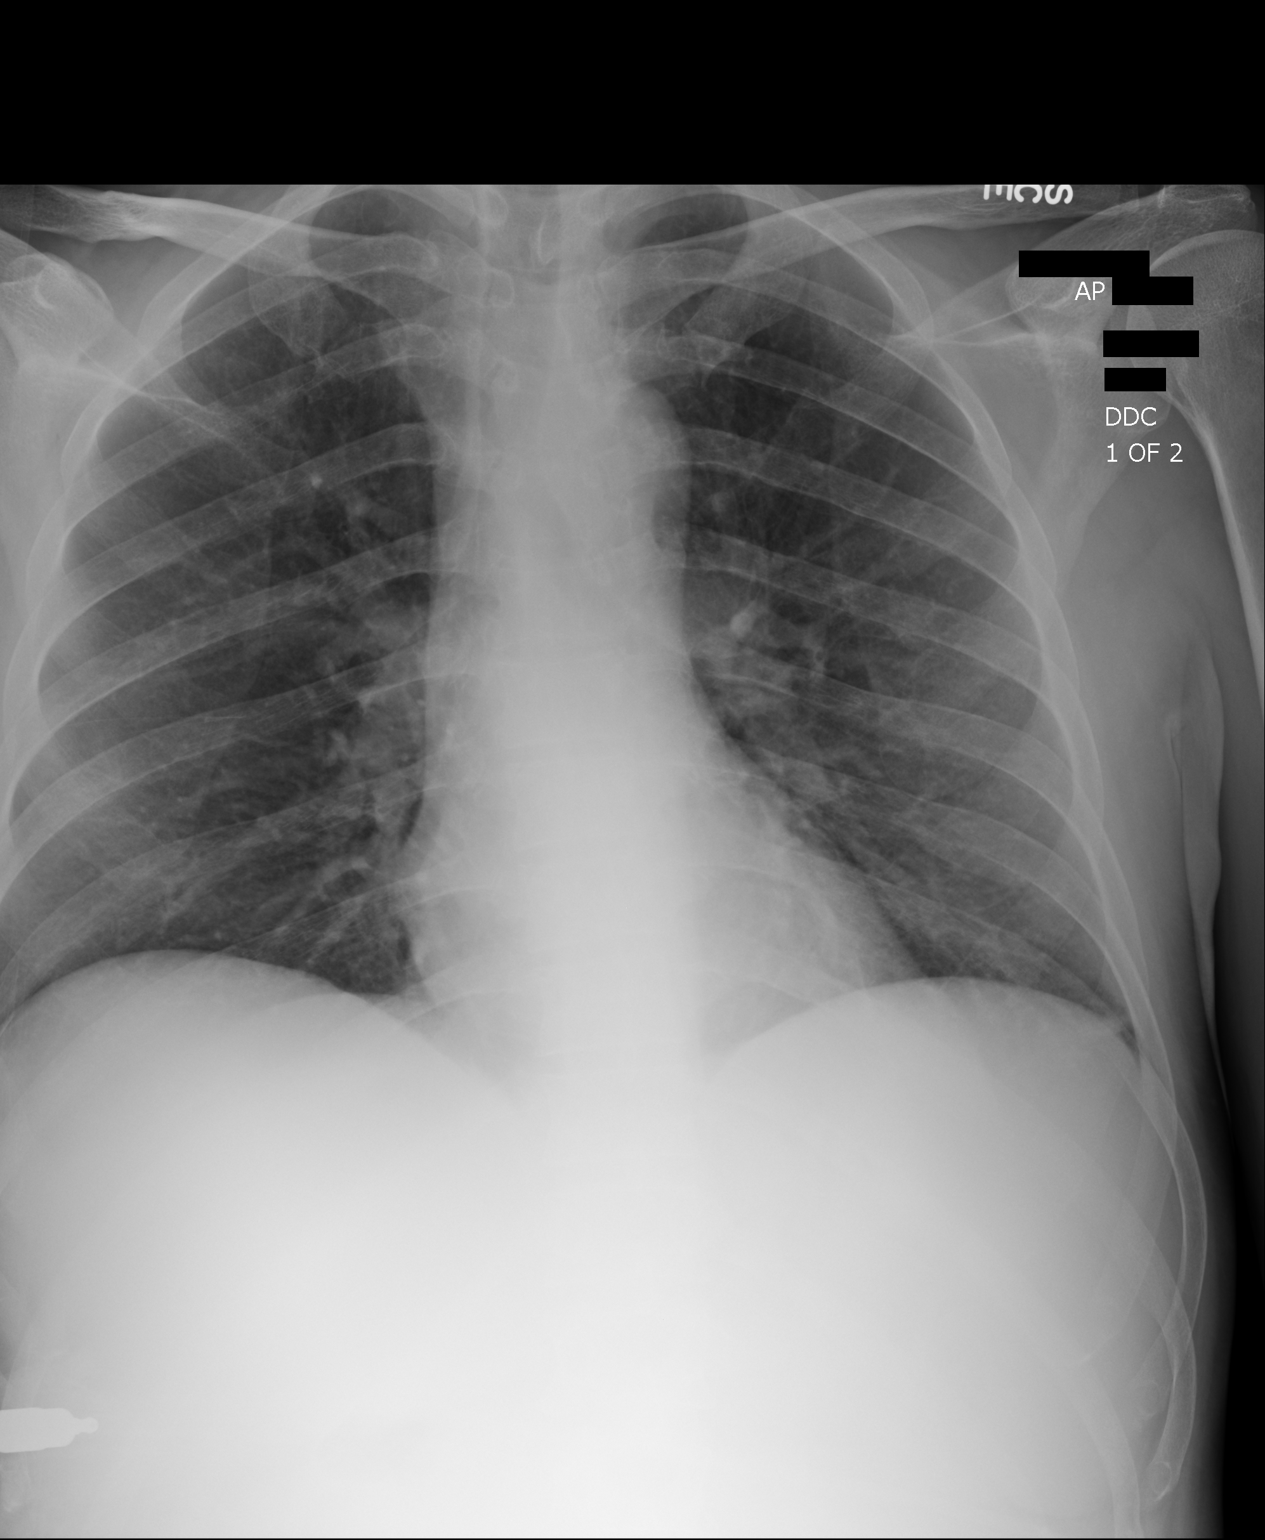

[2 of 2 positions shown; findings below may reference images not displayed]

FINDINGS: The heart size and mediastinal contours are within normal limits.
Both lungs are clear. No pneumothorax or pleural effusion is noted.
The visualized skeletal structures are unremarkable.
IMPRESSION: No acute cardiopulmonary abnormality seen.

## 2017-09-26 IMAGING — CT CT HEAD W/O CM
1 of 2 series · 15 of 30 positions shown, 19 images · non-contrast
Comparison: None.

CLINICAL DATA: Hallucination

EXAM:
CT HEAD WITHOUT CONTRAST
TECHNIQUE: Contiguous axial images were obtained from the base of the skull
through the vertex without intravenous contrast.

[Series 5: head (id) · axial · 0.42mm/px · z∈[+30,+187]mm · 15 of 42 slices shown, 19 images]
[im 3/42  brain]
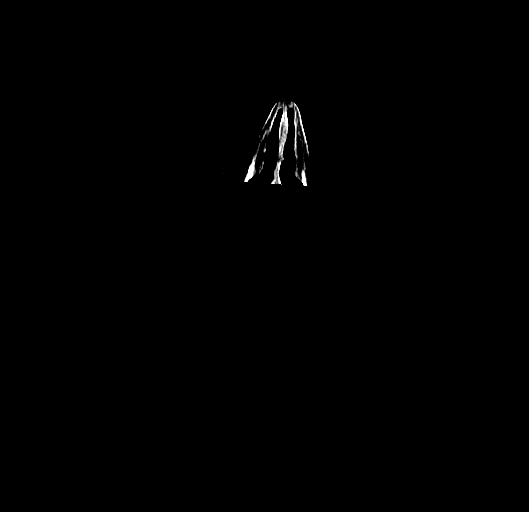
[im 3/42  bone]
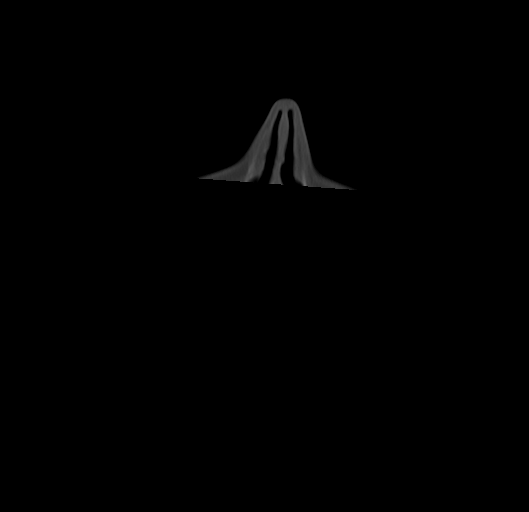
[im 6/42  brain]
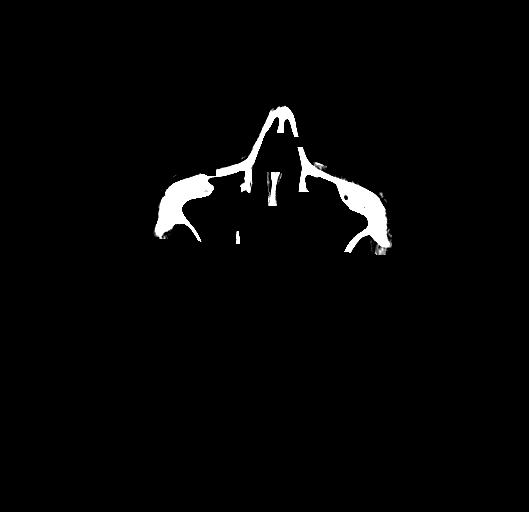
[im 8/42  brain]
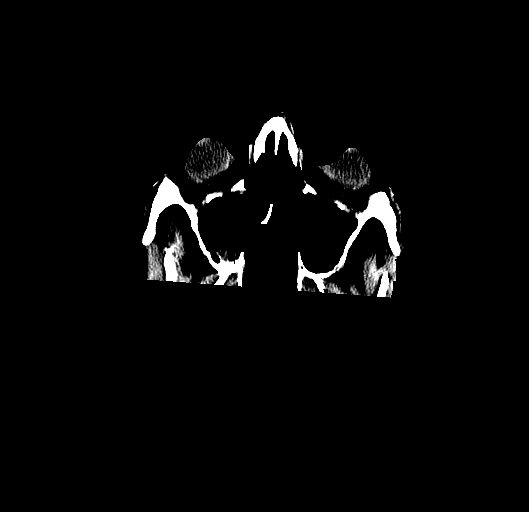
[im 11/42  brain]
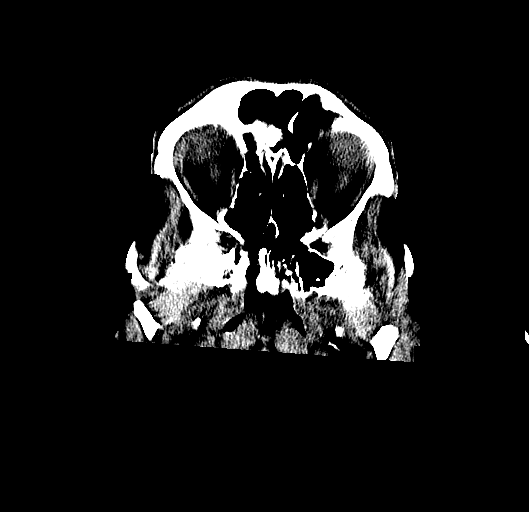
[im 13/42  brain]
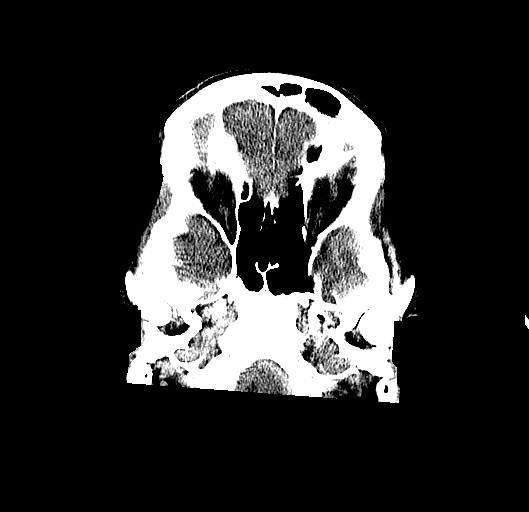
[im 13/42  bone]
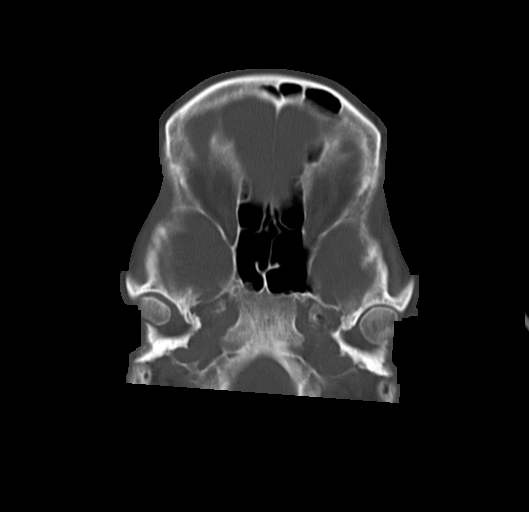
[im 16/42  brain]
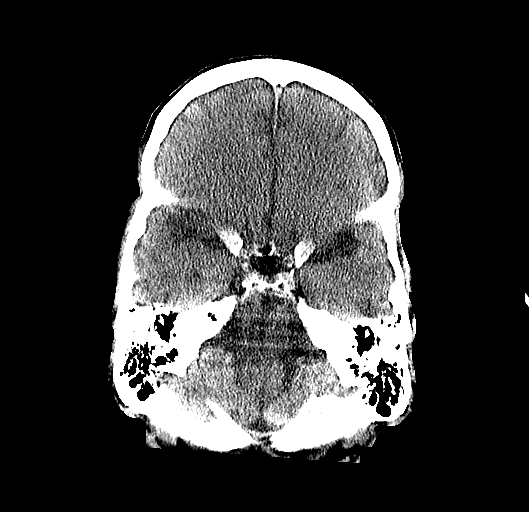
[im 18/42  brain]
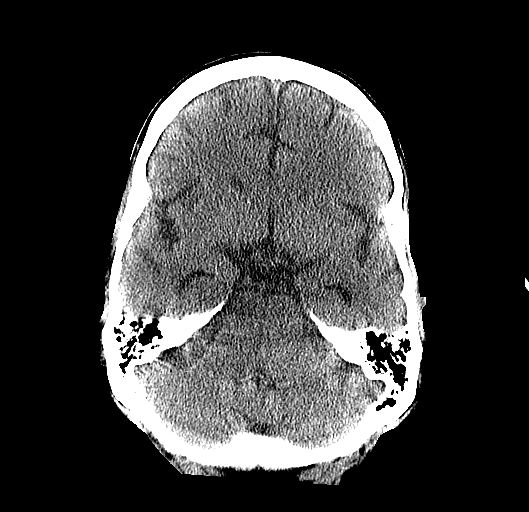
[im 21/42  brain]
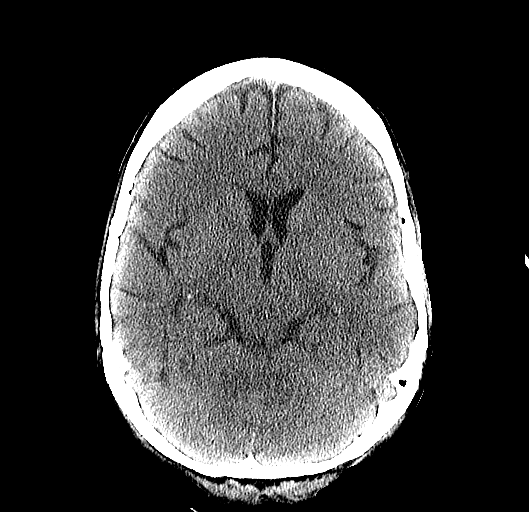
[im 24/42  brain]
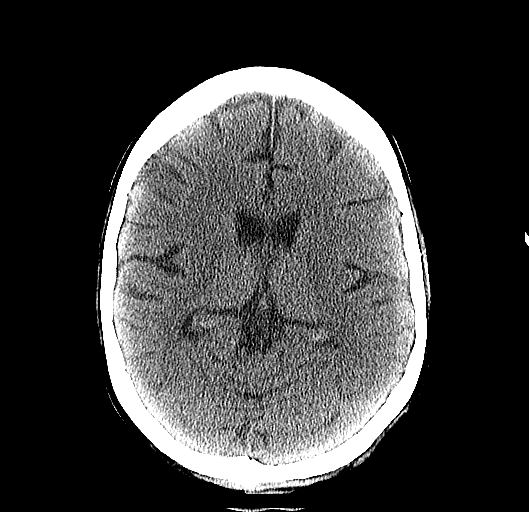
[im 24/42  bone]
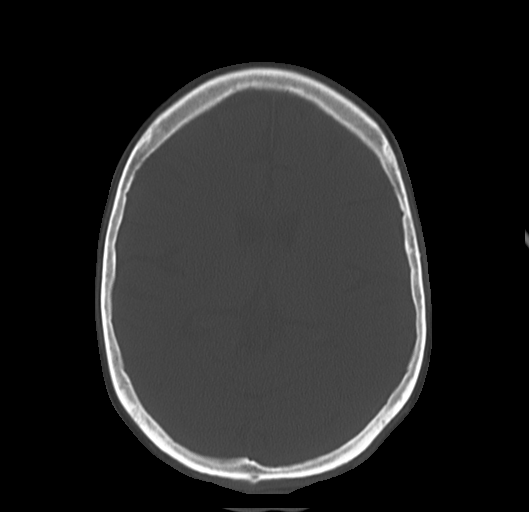
[im 26/42  brain]
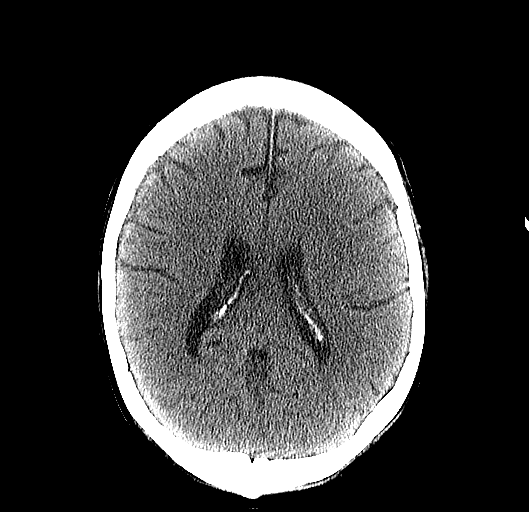
[im 29/42  brain]
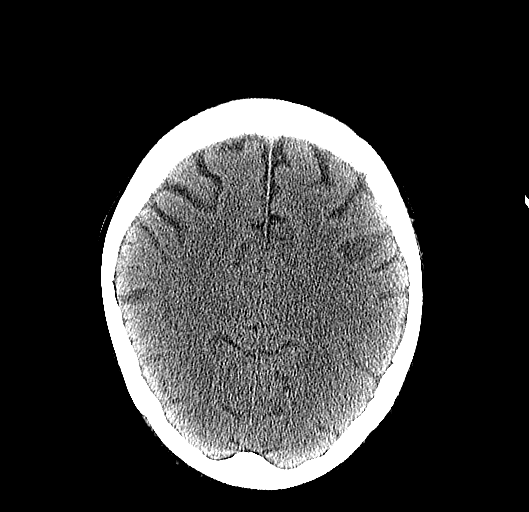
[im 31/42  brain]
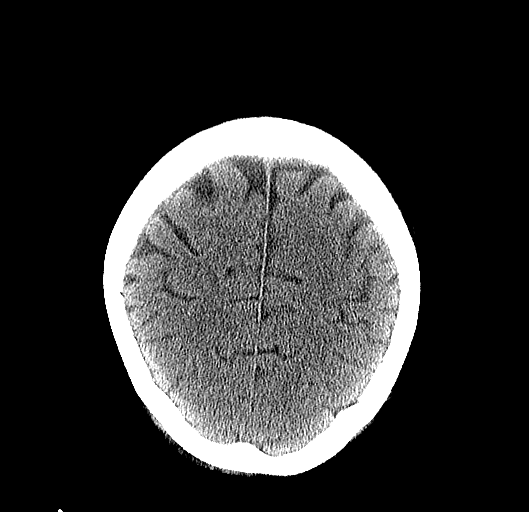
[im 34/42  brain]
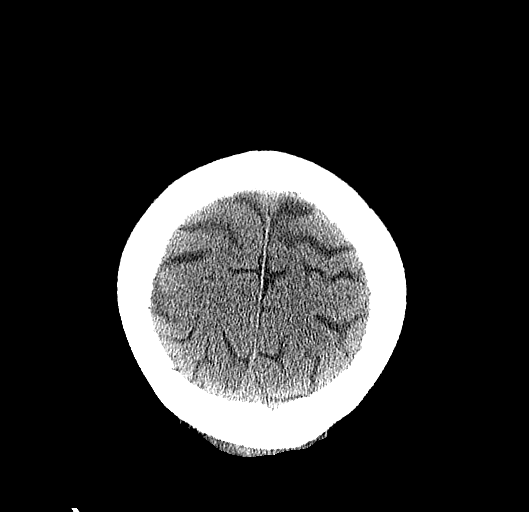
[im 34/42  bone]
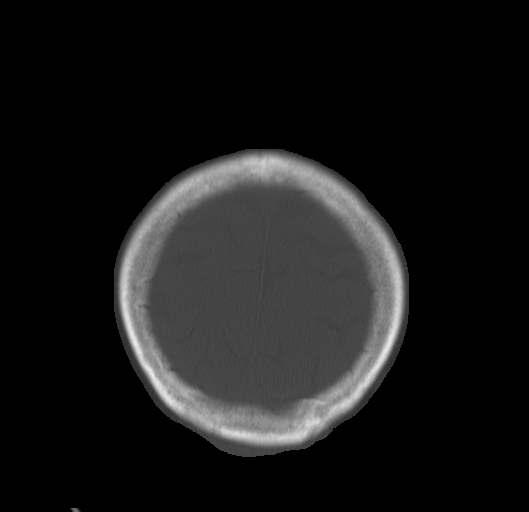
[im 36/42  brain]
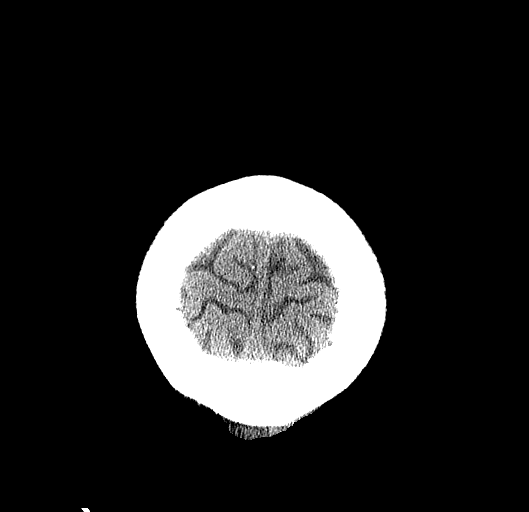
[im 39/42  brain]
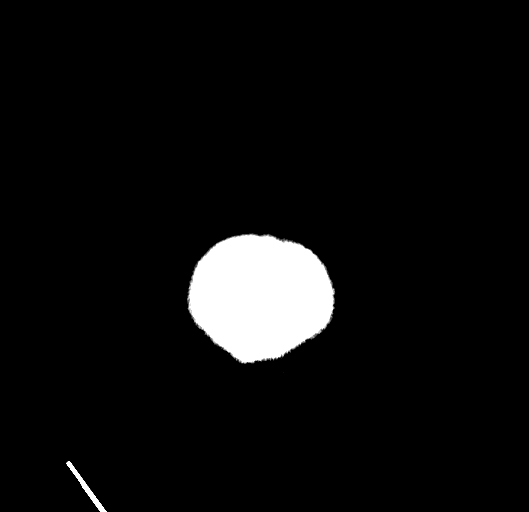

[15 of 30 positions shown; findings below may reference images not displayed]

FINDINGS: Mild global atrophy. No mass effect, midline shift, or acute
intracranial hemorrhage. Mucus and retention cyst in the right
maxillary sinus. Mastoid air cells are clear. Cranium is intact.
IMPRESSION: No acute intracranial pathology.

## 2017-09-27 IMAGING — CR DG CHEST 1V PORT
1 series · 2 of 2 positions shown · non-contrast
Comparison: Chest radiograph from earlier today.

CLINICAL DATA: Intubated. Respiratory failure. Acute aspiration
pneumonia.

EXAM:
PORTABLE CHEST 1 VIEW

[Series 1: ap · 0.17mm/px · 2 of 2 slices shown]
[im 1/2]
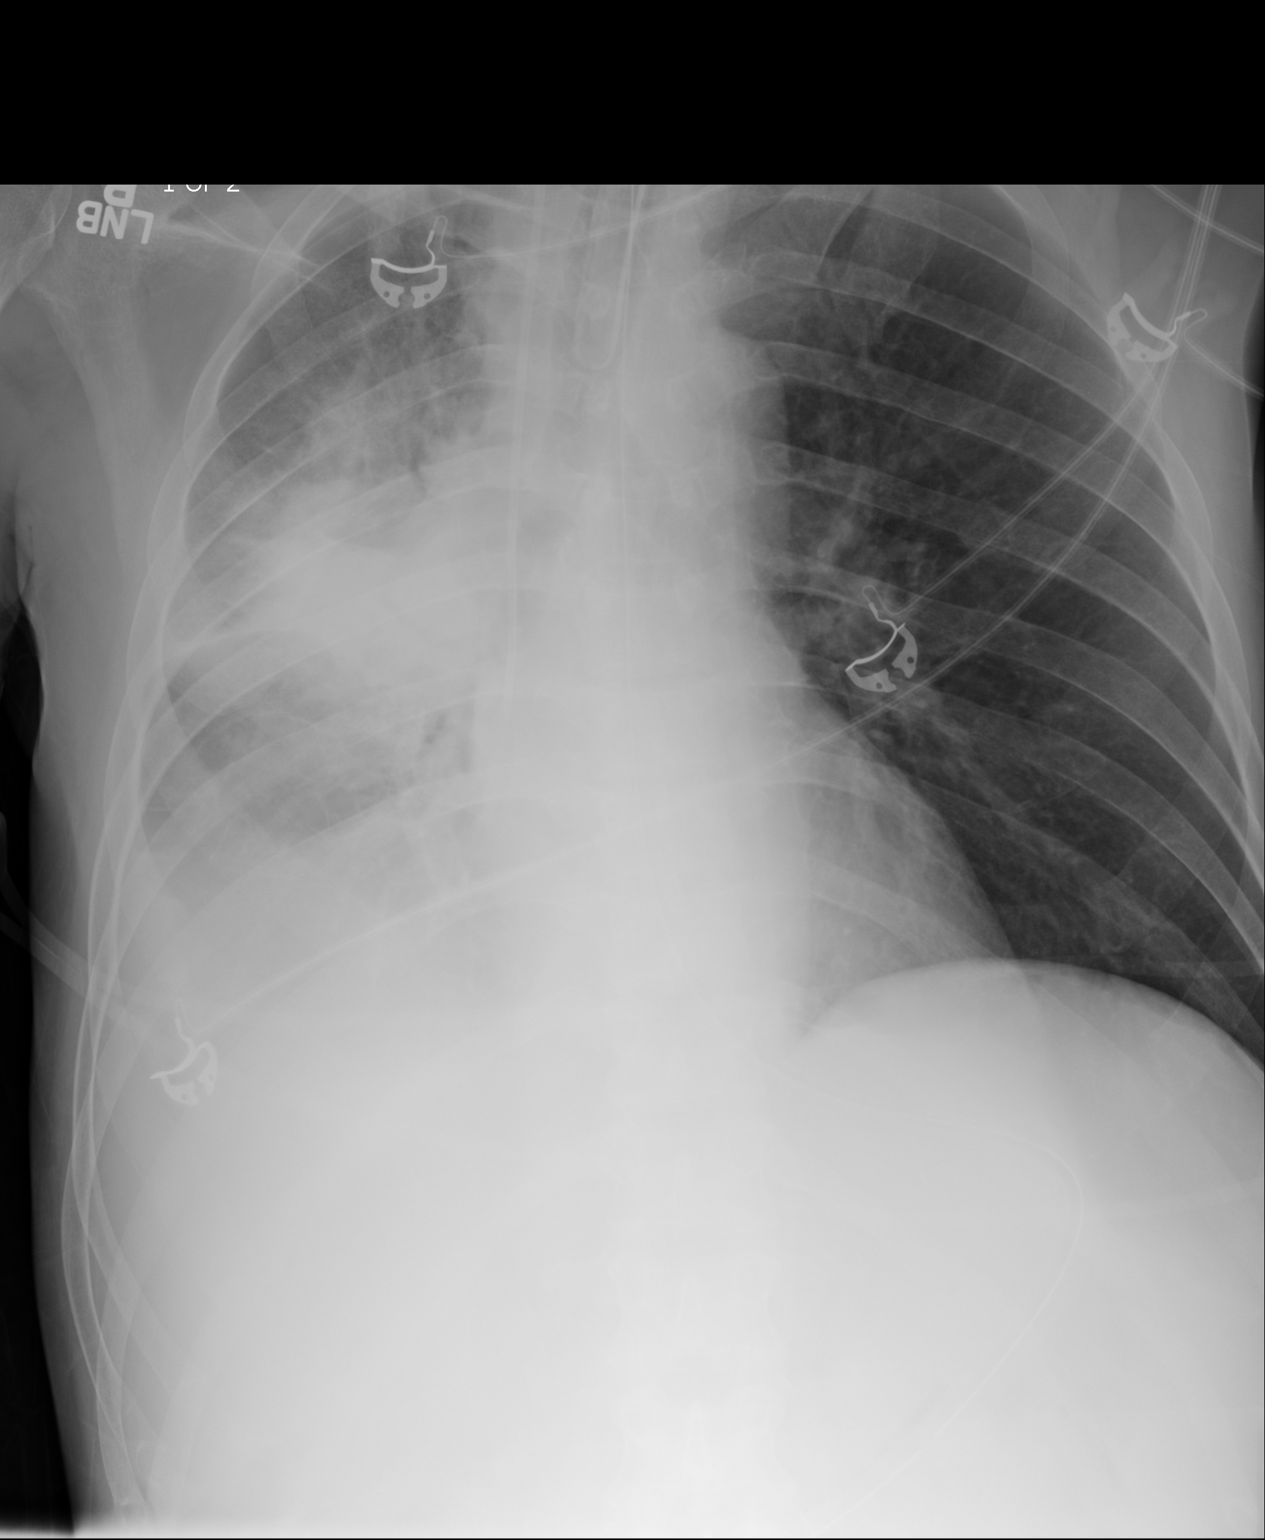
[im 2/2]
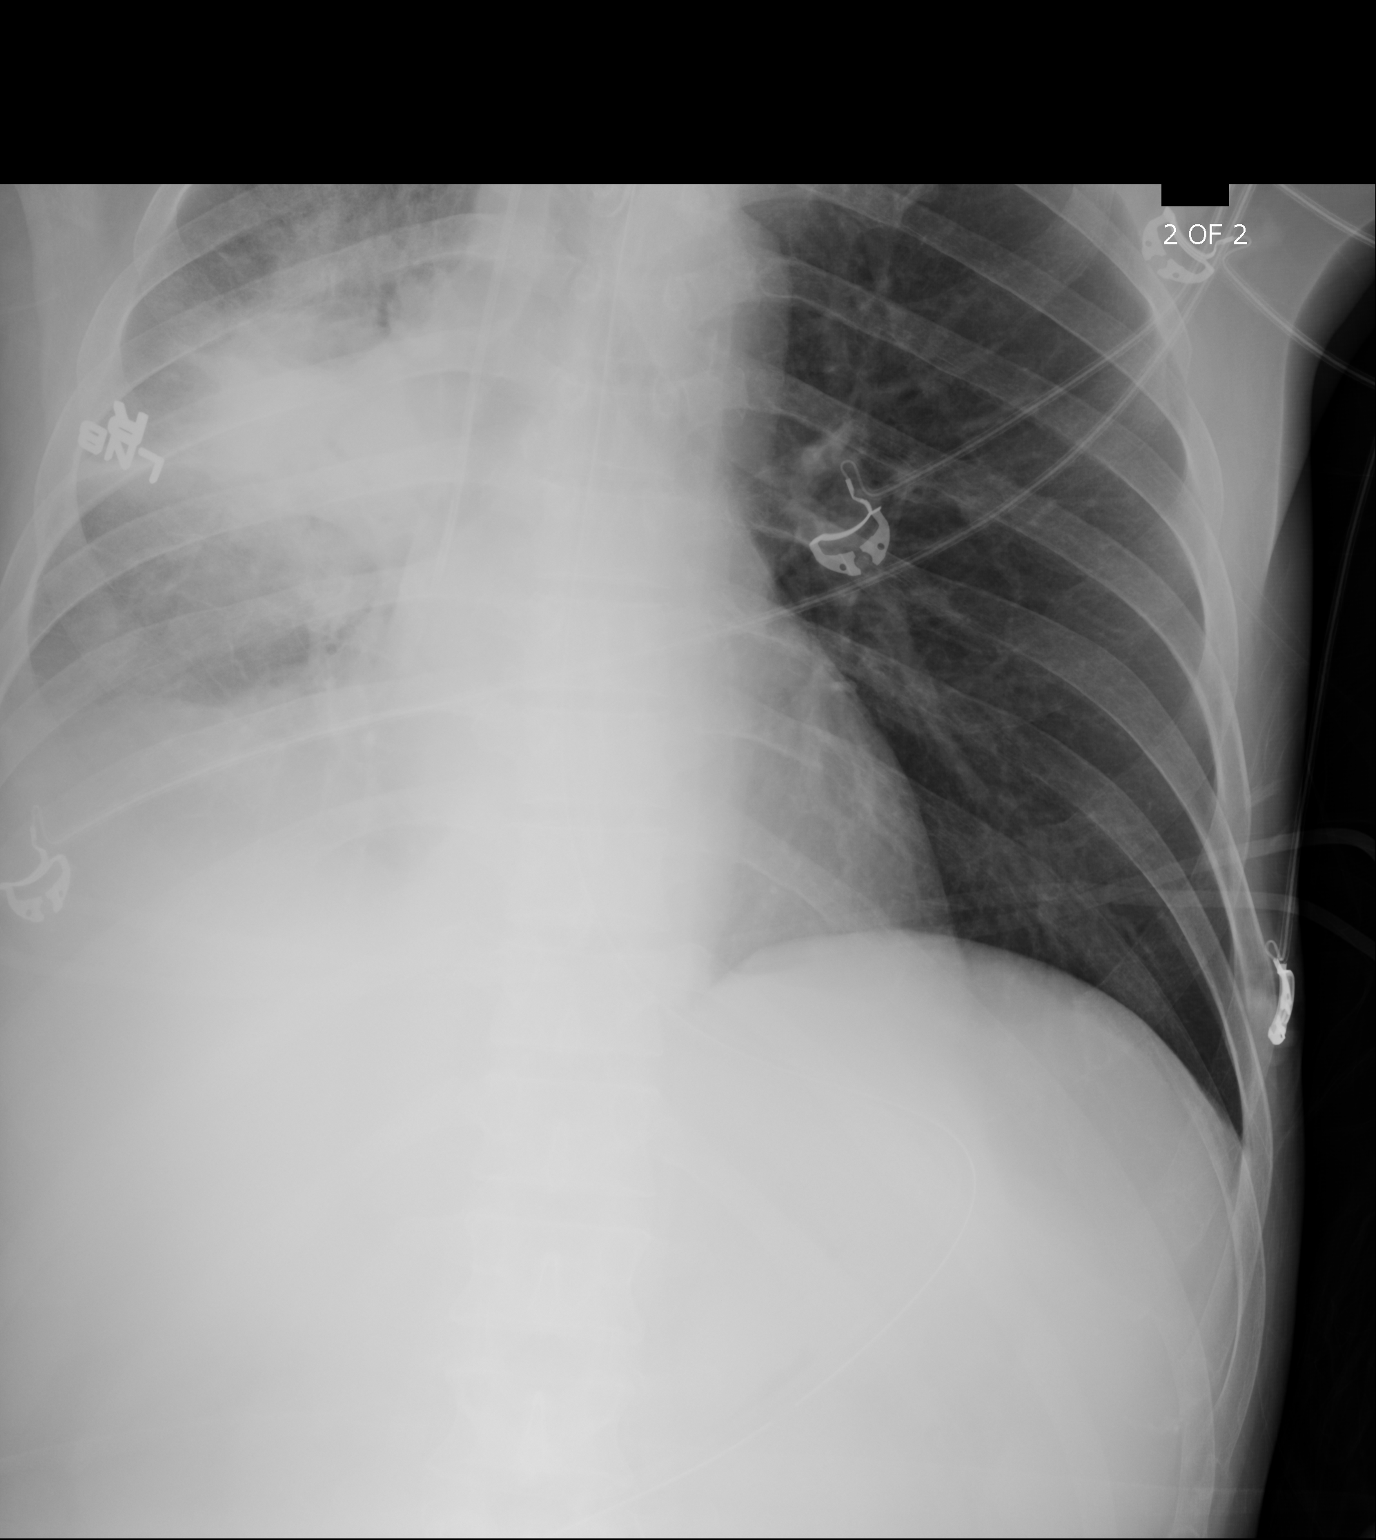

[2 of 2 positions shown; findings below may reference images not displayed]

FINDINGS: Endotracheal tube tip is 2.8 cm above the carina. Right internal
jugular central venous catheter terminates at the cavoatrial
junction. Enteric tube enters the stomach, with its tip not seen on
this image. Stable cardiomediastinal silhouette with normal heart
size. There is significantly improved aeration of the right lung,
with persistent prominent parahilar consolidation in the right lung.
Small right pleural effusion. No left pleural effusion. Clear left
lung. No pneumothorax.
IMPRESSION: 1. Well-positioned endotracheal and enteric tubes and right IJ
central venous catheter. No pneumothorax.
2. Significantly improved right lung aeration with persistent
prominent right parahilar consolidation, likely representing
atelectasis and/or pneumonia given the normal chest radiograph 1 day
prior.
3. Small right pleural effusion.

## 2017-09-27 IMAGING — CR DG CHEST 1V PORT
1 series · 1 of 1 positions shown · non-contrast
Comparison: Chest radiograph performed 05/08/2015

CLINICAL DATA: Acute onset of shortness of breath. Initial
encounter.

EXAM:
PORTABLE CHEST 1 VIEW

[portable]
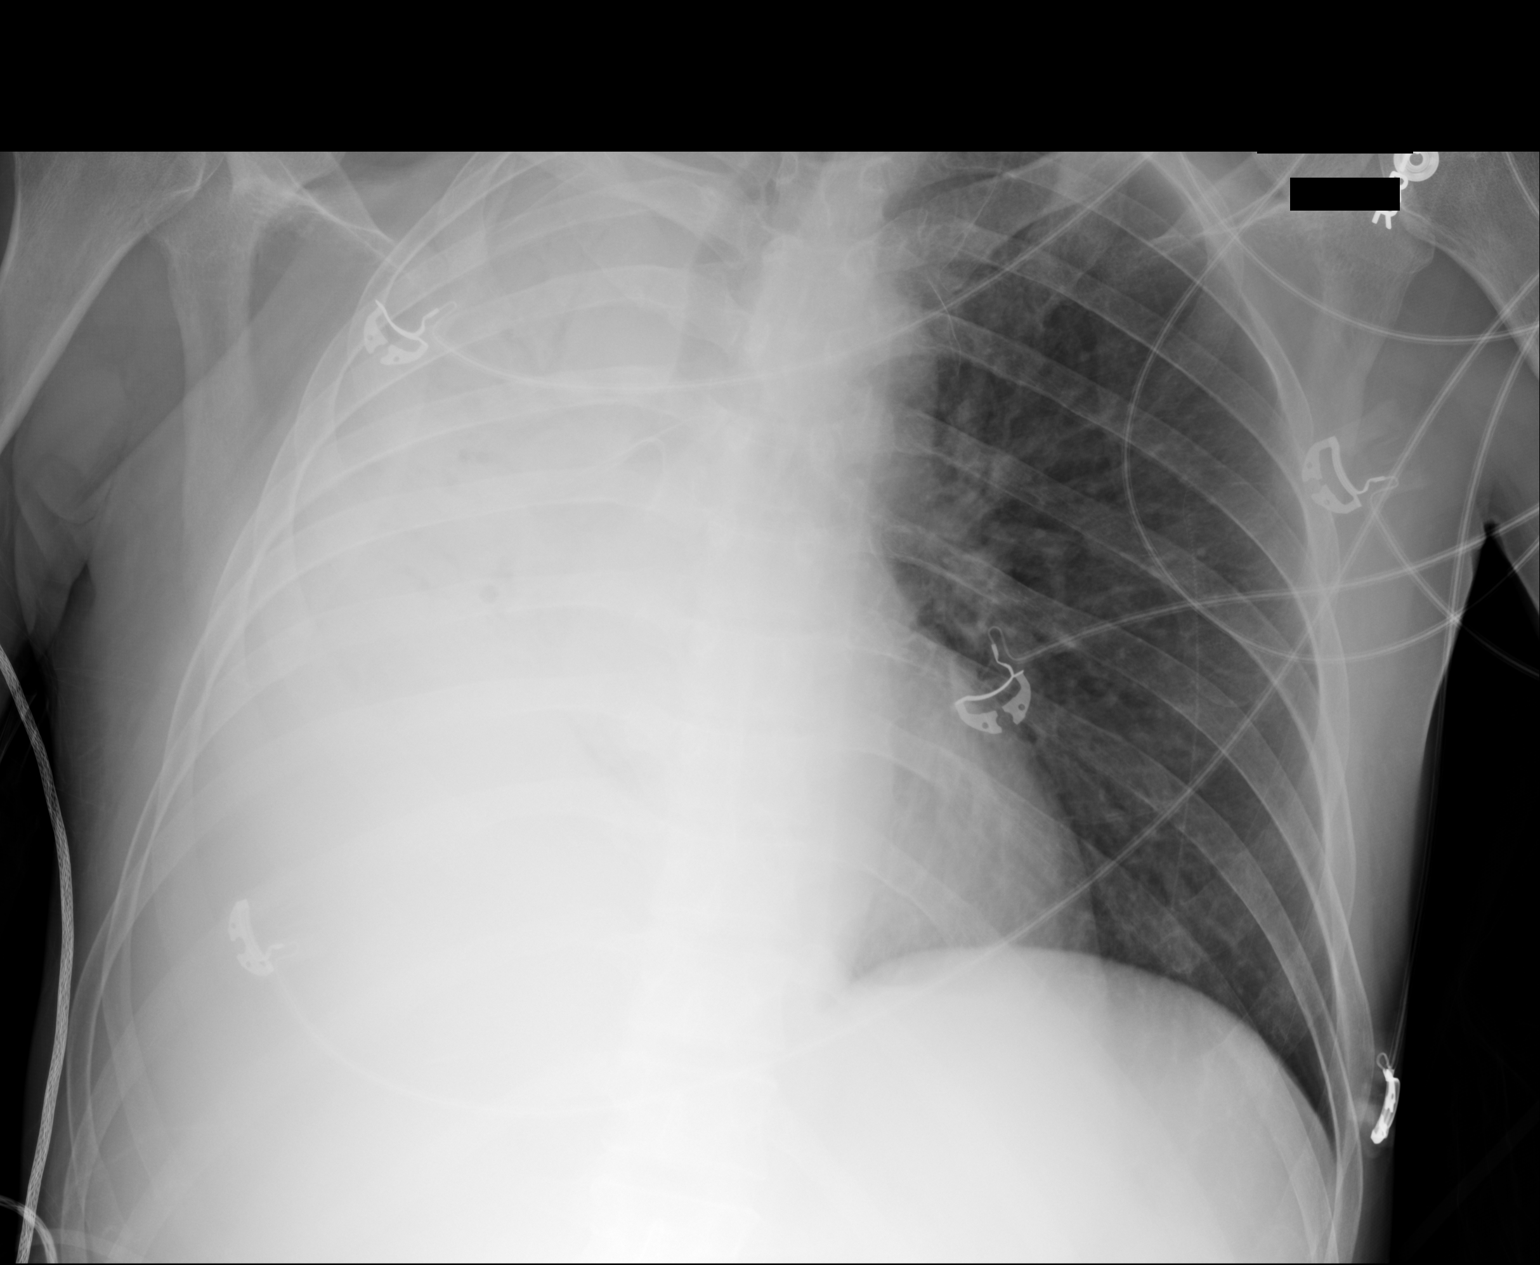

[1 of 1 positions shown; findings below may reference images not displayed]

FINDINGS: There is diffuse opacification of the right hemithorax, reflecting a
large right-sided pleural effusion and diffuse airspace
opacification. Right-sided volume loss is noted, with rightward
mediastinal shift. The left lung appears clear. No pneumothorax is
seen.

The cardiomediastinal silhouette is not well assessed due to the
pleural effusion. No acute osseous abnormalities are identified.
IMPRESSION: Diffuse opacification of the right hemithorax, reflecting a large
right-sided pleural effusion and diffuse airspace opacification.
Right-sided volume loss, with rightward mediastinal shift. Would
perform follow-up chest radiograph after treatment of the acute
process, to exclude an underlying mass.

## 2017-10-08 NOTE — Progress Notes (Signed)
[facility]  Telephone:(336) [phone] Fax:(336) [phone]  ID: [name] OB: 14-Jan-1961  MR#: [id number]  EXB#:[id number]  Patient Care Team: [name], DO as PCP - General (Family Medicine)  CHIEF COMPLAINT: Iron deficiency anemia, thrombocytopenia  INTERVAL HISTORY: Patient returns to clinic today for repeat laboratory work, further evaluation, and consideration of IV Feraheme. He currently feels well and is asymptomatic. He does not complain of easy bleeding or bruising, although admits to some possible melena several weeks ago that resolved spontaneously.  He does not complain of weakness or fatigue. He has no neurologic complaints. He denies any recent fevers or illnesses. He has a good appetite and denies weight loss. He denies any chest pain or shortness of breath. He denies any nausea, vomiting, constipation, or diarrhea. He denies any hematochezia. He has no urinary complaints. Patient offers no further specific complaints today.  REVIEW OF SYSTEMS:   Review of Systems  Constitutional: Negative.  Negative for fever, malaise/fatigue and weight loss.  Respiratory: Negative.  Negative for cough, hemoptysis and shortness of breath.   Cardiovascular: Negative.  Negative for chest pain and leg swelling.  Gastrointestinal: Positive for melena. Negative for abdominal pain and blood in stool.  Genitourinary: Negative.  Negative for hematuria.  Musculoskeletal: Negative.   Skin: Negative.  Negative for rash.  Neurological: Negative.  Negative for sensory change and weakness.  Psychiatric/Behavioral: Negative.  The patient is not nervous/anxious.     As per HPI. Otherwise, a complete review of systems is negative.  PAST MEDICAL HISTORY: Past Medical History:  Diagnosis Date  . Anemia   . Cancer ([place])   . Cirrhosis ([place])   . Colon polyps   . Constipation   . Diarrhea   . Hemorrhoids   . Hypertension    in past  .  Portal vein thrombosis    see chart review 06/05/15  . Shingles 09/2016  . Stomach ulcer   . Weight loss     PAST SURGICAL HISTORY: Past Surgical History:  Procedure Laterality Date  . COLONOSCOPY N/A 06/17/2015   Procedure: COLONOSCOPY;  Surgeon: Hulen Luster, MD;  Location: [facility];  Service: Gastroenterology;  Laterality: N/A;  . COLONOSCOPY WITH PROPOFOL N/A 02/02/2017   Procedure: COLONOSCOPY WITH PROPOFOL;  Surgeon: [name], MD;  Location: [facility];  Service: Endoscopy;  Laterality: N/A;  . ESOPHAGOGASTRODUODENOSCOPY N/A 05/14/2015   Procedure: ESOPHAGOGASTRODUODENOSCOPY (EGD);  Surgeon: Hulen Luster, MD;  Location: [facility] ENDOSCOPY;  Service: Endoscopy;  Laterality: N/A;  . ESOPHAGOGASTRODUODENOSCOPY (EGD) WITH PROPOFOL N/A 02/02/2017   Procedure: ESOPHAGOGASTRODUODENOSCOPY (EGD) WITH PROPOFOL;  Surgeon: [name], MD;  Location: [facility];  Service: Endoscopy;  Laterality: N/A;  . POLYPECTOMY  02/02/2017   Procedure: POLYPECTOMY;  Surgeon: [name], MD;  Location: [facility];  Service: Endoscopy;;    FAMILY HISTORY: Family History  Problem Relation Age of Onset  . CAD Father   . Heart attack Father     ADVANCED DIRECTIVES (Y/N):  N  HEALTH MAINTENANCE: Social History   Tobacco Use  . Smoking status: Current Every Day Smoker    Packs/day: 2.00    Years: 40.00    Pack years: 80.00    Types: Cigarettes  . Smokeless tobacco: Never Used  . Tobacco comment: Only successfully quit for 1 month before due to acute illness. (01/24/17 - down to 1.5 PPD)  Substance Use Topics  . Alcohol use: No    Comment: Sober / alcohol free for >1  year.  . Drug use: No     Colonoscopy:  PAP:  Bone density:  Lipid panel:  Allergies  Allergen Reactions  . Iron Nausea Only  . Lorazepam     "made me anxious and mean"     Current Outpatient Medications  Medication Sig Dispense Refill  . famotidine (PEPCID) 20 MG tablet Take 1 tablet (20 mg  total) by mouth 2 (two) times daily. 60 tablet 0  . loratadine (CLARITIN) 10 MG tablet Take 10 mg by mouth daily.     No current facility-administered medications for this visit.     OBJECTIVE: Vitals:   10/13/17 1034  BP: (!) 143/78  Pulse: 84  Resp: 18  Temp: (!) 97.3 F (36.3 C)     Body mass index is 20.09 kg/m.    ECOG FS:0 - Asymptomatic  General: Well-developed, well-nourished, no acute distress. Eyes: Pink conjunctiva, anicteric sclera. Lungs: Clear to auscultation bilaterally. Heart: Regular rate and rhythm. No rubs, murmurs, or gallops. Abdomen: Soft, nontender, nondistended. No organomegaly noted, normoactive bowel sounds. Musculoskeletal: No edema, cyanosis, or clubbing. Neuro: Alert, answering all questions appropriately. Cranial nerves grossly intact. Skin: No rashes or petechiae noted. Psych: Normal affect.   LAB RESULTS:  Lab Results  Component Value Date   NA 141 01/19/2017   K 3.1 (L) 01/19/2017   CL 105 01/19/2017   CO2 26 01/19/2017   GLUCOSE 92 01/19/2017   BUN 8 01/19/2017   CREATININE 0.64 01/19/2017   CALCIUM 9.3 01/19/2017   PROT 7.2 01/19/2017   ALBUMIN 4.4 01/19/2017   AST 19 01/19/2017   ALT 19 01/19/2017   ALKPHOS 101 01/19/2017   BILITOT 0.8 01/19/2017   GFRNONAA >60 01/19/2017   GFRAA >60 01/19/2017    Lab Results  Component Value Date   WBC 6.7 10/13/2017   NEUTROABS 4.9 10/13/2017   HGB 7.6 (L) 10/13/2017   HCT 23.0 (L) 10/13/2017   MCV 88.9 10/13/2017   PLT 121 (L) 10/13/2017   Lab Results  Component Value Date   IRON 44 (L) 06/14/2017   TIBC 463 (H) 06/14/2017   IRONPCTSAT 10 (L) 06/14/2017   Lab Results  Component Value Date   FERRITIN 6 (L) 06/14/2017     STUDIES: No results found.  ASSESSMENT: Iron deficiency anemia, thrombocytopenia  PLAN:    1. Iron deficiency anemia: Likely secondary to GI blood loss. Colonoscopy and EGD completed on February 02, 2017 removed 6 polyps noted esophageal varices as well as  portal hypertension.  Patient reports some possible melena and his hemoglobin has trended down significantly.  Iron stores are pending at time of dictation. The remainder of his laboratory work is either negative or within normal limits.  Patient cannot tolerate oral iron supplementation.  Proceed with 510 mg IV Feraheme today.  Return to clinic in 1 week for second infusion and then in 3 months with repeat laboratory work and further evaluation. 2. Thrombocytopenia: Slightly improved.  Likely secondary to ongoing cirrhosis. Abdominal ultrasound on January 25, 2017 did not report any splenomegaly. No intervention is needed at this time. Monitor.  3. Liver lesions: Patient was noted to have several poorly defined low-attenuation lesions in his left hepatic lobe on CT scan on June 02, 2015. No follow-up CT scan has been completed for evaluation. Patient's AFP is within normal limits. Will defer any repeat imaging to GI.  Patient expressed understanding and was in agreement with this plan. He also understands that He can call clinic at any time  with any questions, concerns, or complaints.    Lloyd Huger, MD   10/13/2017 12:52 PM

## 2017-10-11 ENCOUNTER — Inpatient Hospital Stay: Payer: 59

## 2017-10-12 ENCOUNTER — Inpatient Hospital Stay: Payer: 59 | Attending: Oncology

## 2017-10-12 DIAGNOSIS — D500 Iron deficiency anemia secondary to blood loss (chronic): Secondary | ICD-10-CM

## 2017-10-12 DIAGNOSIS — D509 Iron deficiency anemia, unspecified: Secondary | ICD-10-CM | POA: Diagnosis not present

## 2017-10-12 DIAGNOSIS — D696 Thrombocytopenia, unspecified: Secondary | ICD-10-CM | POA: Insufficient documentation

## 2017-10-12 DIAGNOSIS — K769 Liver disease, unspecified: Secondary | ICD-10-CM | POA: Insufficient documentation

## 2017-10-12 DIAGNOSIS — F17210 Nicotine dependence, cigarettes, uncomplicated: Secondary | ICD-10-CM | POA: Insufficient documentation

## 2017-10-13 ENCOUNTER — Inpatient Hospital Stay (HOSPITAL_BASED_OUTPATIENT_CLINIC_OR_DEPARTMENT_OTHER): Payer: 59 | Admitting: Oncology

## 2017-10-13 ENCOUNTER — Encounter: Payer: Self-pay | Admitting: Oncology

## 2017-10-13 ENCOUNTER — Inpatient Hospital Stay: Payer: 59

## 2017-10-13 VITALS — BP 143/78 | HR 84 | Temp 97.3°F | Resp 18 | Ht 70.0 in | Wt 140.0 lb

## 2017-10-13 DIAGNOSIS — D500 Iron deficiency anemia secondary to blood loss (chronic): Secondary | ICD-10-CM

## 2017-10-13 DIAGNOSIS — D696 Thrombocytopenia, unspecified: Secondary | ICD-10-CM | POA: Diagnosis not present

## 2017-10-13 DIAGNOSIS — K7689 Other specified diseases of liver: Secondary | ICD-10-CM

## 2017-10-13 DIAGNOSIS — I8501 Esophageal varices with bleeding: Secondary | ICD-10-CM

## 2017-10-13 DIAGNOSIS — D509 Iron deficiency anemia, unspecified: Secondary | ICD-10-CM | POA: Diagnosis not present

## 2017-10-13 LAB — CBC WITH DIFFERENTIAL/PLATELET
BASOS PCT: 1 %
Basophils Absolute: 0 10*3/uL (ref 0–0.1)
EOS ABS: 0.3 10*3/uL (ref 0–0.7)
EOS PCT: 4 %
HCT: 23 % — ABNORMAL LOW (ref 40.0–52.0)
Hemoglobin: 7.6 g/dL — ABNORMAL LOW (ref 13.0–18.0)
LYMPHS ABS: 0.8 10*3/uL — AB (ref 1.0–3.6)
Lymphocytes Relative: 12 %
MCH: 29.6 pg (ref 26.0–34.0)
MCHC: 33.2 g/dL (ref 32.0–36.0)
MCV: 88.9 fL (ref 80.0–100.0)
MONOS PCT: 10 %
Monocytes Absolute: 0.7 10*3/uL (ref 0.2–1.0)
Neutro Abs: 4.9 10*3/uL (ref 1.4–6.5)
Neutrophils Relative %: 73 %
PLATELETS: 121 10*3/uL — AB (ref 150–440)
RBC: 2.58 MIL/uL — ABNORMAL LOW (ref 4.40–5.90)
RDW: 16.5 % — AB (ref 11.5–14.5)
WBC: 6.7 10*3/uL (ref 3.8–10.6)

## 2017-10-13 LAB — IRON AND TIBC
IRON: 11 ug/dL — AB (ref 45–182)
SATURATION RATIOS: 2 % — AB (ref 17.9–39.5)
TIBC: 451 ug/dL — ABNORMAL HIGH (ref 250–450)
UIBC: 440 ug/dL

## 2017-10-13 LAB — AFP TUMOR MARKER: AFP, Serum, Tumor Marker: 2.9 ng/mL (ref 0.0–8.3)

## 2017-10-13 MED ORDER — FERUMOXYTOL INJECTION 510 MG/17 ML
INTRAVENOUS | Status: AC
  Filled 2017-10-13: qty 17

## 2017-10-13 MED ORDER — SODIUM CHLORIDE 0.9 % IV SOLN
510.0000 mg | Freq: Once | INTRAVENOUS | Status: AC
  Administered 2017-10-13: 510 mg via INTRAVENOUS
  Filled 2017-10-13: qty 17

## 2017-10-13 MED ORDER — SODIUM CHLORIDE 0.9 % IV SOLN
Freq: Once | INTRAVENOUS | Status: AC
  Administered 2017-10-13: 11:00:00 via INTRAVENOUS
  Filled 2017-10-13: qty 1000

## 2017-10-13 NOTE — Patient Instructions (Signed)

## 2017-10-13 NOTE — Progress Notes (Signed)
Patient c/o having a sinus infection x 1 week and want some medication for it

## 2017-10-19 ENCOUNTER — Inpatient Hospital Stay: Payer: 59

## 2017-10-19 ENCOUNTER — Other Ambulatory Visit: Payer: 59

## 2017-10-19 VITALS — BP 114/77 | HR 67 | Temp 97.6°F | Resp 18

## 2017-10-19 DIAGNOSIS — D500 Iron deficiency anemia secondary to blood loss (chronic): Secondary | ICD-10-CM

## 2017-10-19 DIAGNOSIS — D509 Iron deficiency anemia, unspecified: Secondary | ICD-10-CM | POA: Diagnosis not present

## 2017-10-19 MED ORDER — SODIUM CHLORIDE 0.9 % IV SOLN
Freq: Once | INTRAVENOUS | Status: AC
  Administered 2017-10-19: 10:00:00 via INTRAVENOUS
  Filled 2017-10-19: qty 1000

## 2017-10-19 MED ORDER — SODIUM CHLORIDE 0.9 % IV SOLN
510.0000 mg | Freq: Once | INTRAVENOUS | Status: AC
  Administered 2017-10-19: 510 mg via INTRAVENOUS
  Filled 2017-10-19: qty 17

## 2017-10-19 NOTE — Patient Instructions (Signed)

## 2018-01-18 ENCOUNTER — Inpatient Hospital Stay: Payer: 59 | Attending: Oncology

## 2018-01-18 DIAGNOSIS — D500 Iron deficiency anemia secondary to blood loss (chronic): Secondary | ICD-10-CM | POA: Diagnosis not present

## 2018-01-18 DIAGNOSIS — I8501 Esophageal varices with bleeding: Secondary | ICD-10-CM | POA: Insufficient documentation

## 2018-01-18 LAB — IRON AND TIBC
Iron: 46 ug/dL (ref 45–182)
Saturation Ratios: 11 % — ABNORMAL LOW (ref 17.9–39.5)
TIBC: 419 ug/dL (ref 250–450)
UIBC: 373 ug/dL

## 2018-01-18 LAB — CBC WITH DIFFERENTIAL/PLATELET
BASOS PCT: 1 %
Basophils Absolute: 0.1 10*3/uL (ref 0–0.1)
Eosinophils Absolute: 0.1 10*3/uL (ref 0–0.7)
Eosinophils Relative: 2 %
HEMATOCRIT: 38.3 % — AB (ref 40.0–52.0)
HEMOGLOBIN: 12.6 g/dL — AB (ref 13.0–18.0)
LYMPHS ABS: 0.7 10*3/uL — AB (ref 1.0–3.6)
Lymphocytes Relative: 12 %
MCH: 27.4 pg (ref 26.0–34.0)
MCHC: 32.8 g/dL (ref 32.0–36.0)
MCV: 83.5 fL (ref 80.0–100.0)
MONOS PCT: 11 %
Monocytes Absolute: 0.7 10*3/uL (ref 0.2–1.0)
NEUTROS PCT: 74 %
Neutro Abs: 4.4 10*3/uL (ref 1.4–6.5)
Platelets: 68 10*3/uL — ABNORMAL LOW (ref 150–440)
RBC: 4.59 MIL/uL (ref 4.40–5.90)
RDW: 18 % — ABNORMAL HIGH (ref 11.5–14.5)
WBC: 6 10*3/uL (ref 3.8–10.6)

## 2018-01-18 LAB — FERRITIN: Ferritin: 10 ng/mL — ABNORMAL LOW (ref 24–336)

## 2018-01-19 ENCOUNTER — Encounter: Payer: Self-pay | Admitting: Nurse Practitioner

## 2018-01-19 ENCOUNTER — Inpatient Hospital Stay: Payer: 59

## 2018-01-19 ENCOUNTER — Inpatient Hospital Stay (HOSPITAL_BASED_OUTPATIENT_CLINIC_OR_DEPARTMENT_OTHER): Payer: 59 | Admitting: Nurse Practitioner

## 2018-01-19 VITALS — BP 171/94 | HR 65 | Temp 97.3°F | Resp 20 | Wt 143.0 lb

## 2018-01-19 VITALS — BP 146/88 | HR 59

## 2018-01-19 DIAGNOSIS — F17210 Nicotine dependence, cigarettes, uncomplicated: Secondary | ICD-10-CM

## 2018-01-19 DIAGNOSIS — D696 Thrombocytopenia, unspecified: Secondary | ICD-10-CM | POA: Diagnosis not present

## 2018-01-19 DIAGNOSIS — D500 Iron deficiency anemia secondary to blood loss (chronic): Secondary | ICD-10-CM

## 2018-01-19 DIAGNOSIS — K769 Liver disease, unspecified: Secondary | ICD-10-CM

## 2018-01-19 DIAGNOSIS — D509 Iron deficiency anemia, unspecified: Secondary | ICD-10-CM

## 2018-01-19 MED ORDER — POLYSACCHARIDE IRON COMPLEX 150 MG PO CAPS: 150.0000 mg | ORAL_CAPSULE | Freq: Every day | ORAL | 0 refills | Status: DC

## 2018-01-19 MED ORDER — SODIUM CHLORIDE 0.9 % IV SOLN
510.0000 mg | Freq: Once | INTRAVENOUS | Status: AC
  Administered 2018-01-19: 510 mg via INTRAVENOUS
  Filled 2018-01-19: qty 17

## 2018-01-19 MED ORDER — SODIUM CHLORIDE 0.9 % IV SOLN
Freq: Once | INTRAVENOUS | Status: AC
  Administered 2018-01-19: 12:00:00 via INTRAVENOUS
  Filled 2018-01-19: qty 1000

## 2018-01-19 NOTE — Progress Notes (Signed)
[place]  Telephone:(336) [phone] Fax:(336) [phone]  ID: Manuel Jimenez OB: [birth year]  MR#: [id number]  HKV#:[id number]  Patient Care Team: [name], DO as PCP - General (Family Medicine) Lloyd Huger, MD as Consulting Physician (Hematology and Oncology)  CHIEF COMPLAINT: Iron deficiency anemia, thrombocytopenia  INTERVAL HISTORY: Patient returns to clinic today for three-month follow-up with repeat lab work, further evaluation, and consideration of IV Feraheme.  He reports that he feels well and is asymptomatic.  He denies easy bleeding or bruising but had possible melena several months ago that resolved spontaneously and no repeat occurrences.  He denies weakness or fatigue.  He states that his appetite is good and he denies unexplained weight loss.  He denies chest pain, shortness of breath, nausea, vomiting, constipation, or diarrhea.  He denies any hematochezia.  He denies urinary complaints.  He offers no further specific complaints today.  REVIEW OF SYSTEMS:   Review of Systems  Constitutional: Negative.  Negative for fever, malaise/fatigue and weight loss.  Respiratory: Negative.  Negative for cough, hemoptysis and shortness of breath.   Cardiovascular: Negative.  Negative for chest pain and leg swelling.  Gastrointestinal: Negative for abdominal pain, blood in stool and melena.  Genitourinary: Negative.  Negative for hematuria.  Musculoskeletal: Negative.   Skin: Negative.  Negative for rash.  Neurological: Negative.  Negative for sensory change and weakness.  Psychiatric/Behavioral: Negative.  The patient is not nervous/anxious.     As per HPI. Otherwise, a complete review of systems is negative.  PAST MEDICAL HISTORY: Past Medical History:  Diagnosis Date  . Anemia   . Cancer ([place])   . Cirrhosis ([place])   . Colon polyps   . Constipation   . Diarrhea   . Hemorrhoids   . Hypertension    in past  .  Portal vein thrombosis    see chart review 06/05/15  . Shingles 09/2016  . Stomach ulcer   . Weight loss     PAST SURGICAL HISTORY: Past Surgical History:  Procedure Laterality Date  . COLONOSCOPY N/A 06/17/2015   Procedure: COLONOSCOPY;  Surgeon: Hulen Luster, MD;  Location: [place];  Service: Gastroenterology;  Laterality: N/A;  . COLONOSCOPY WITH PROPOFOL N/A 02/02/2017   Procedure: COLONOSCOPY WITH PROPOFOL;  Surgeon: [name], MD;  Location: [place];  Service: Endoscopy;  Laterality: N/A;  . ESOPHAGOGASTRODUODENOSCOPY N/A 05/14/2015   Procedure: ESOPHAGOGASTRODUODENOSCOPY (EGD);  Surgeon: Hulen Luster, MD;  Location: [facility] ENDOSCOPY;  Service: Endoscopy;  Laterality: N/A;  . ESOPHAGOGASTRODUODENOSCOPY (EGD) WITH PROPOFOL N/A 02/02/2017   Procedure: ESOPHAGOGASTRODUODENOSCOPY (EGD) WITH PROPOFOL;  Surgeon: [name], MD;  Location: [place];  Service: Endoscopy;  Laterality: N/A;  . POLYPECTOMY  02/02/2017   Procedure: POLYPECTOMY;  Surgeon: [name], MD;  Location: [place];  Service: Endoscopy;;    FAMILY HISTORY: Family History  Problem Relation Age of Onset  . CAD Father   . Heart attack Father     ADVANCED DIRECTIVES (Y/N):  N  HEALTH MAINTENANCE: Social History   Tobacco Use  . Smoking status: Current Every Day Smoker    Packs/day: 2.00    Years: 40.00    Pack years: 80.00    Types: Cigarettes  . Smokeless tobacco: Never Used  . Tobacco comment: Only successfully quit for 1 month before due to acute illness. (01/24/17 - down to 1.5 PPD)  Substance Use Topics  . 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  . Drug use: No     Colonoscopy:  PAP:  Bone density:  Lipid panel:  Allergies  Allergen Reactions  . Iron Nausea Only  . Lorazepam     "made me anxious and mean"     Current Outpatient Medications  Medication Sig Dispense Refill  . loratadine (CLARITIN) 10 MG tablet Take 10 mg by  mouth daily.    . famotidine (PEPCID) 20 MG tablet Take 1 tablet (20 mg total) by mouth 2 (two) times daily. 60 tablet 0   No current facility-administered medications for this visit.     OBJECTIVE: Vitals:   01/19/18 1059  BP: (!) 171/94  Pulse: 65  Resp: 20  Temp: (!) 97.3 F (36.3 C)     Body mass index is 20.52 kg/m.    ECOG FS:0 - Asymptomatic  General: Well-developed, well-nourished, no acute distress. Eyes: Pink conjunctiva, anicteric sclera. Lungs: Clear to auscultation bilaterally. Heart: Regular rate and rhythm. No rubs, murmurs, or gallops. Abdomen: Soft, nontender, nondistended. No organomegaly noted, normoactive bowel sounds. Musculoskeletal: No edema, cyanosis, or clubbing. Neuro: Alert, answering all questions appropriately. Cranial nerves grossly intact. Skin: No rashes or petechiae noted. Psych: Normal affect.   LAB RESULTS:  Lab Results  Component Value Date   NA 141 01/19/2017   K 3.1 (L) 01/19/2017   CL 105 01/19/2017   CO2 26 01/19/2017   GLUCOSE 92 01/19/2017   BUN 8 01/19/2017   CREATININE 0.64 01/19/2017   CALCIUM 9.3 01/19/2017   PROT 7.2 01/19/2017   ALBUMIN 4.4 01/19/2017   AST 19 01/19/2017   ALT 19 01/19/2017   ALKPHOS 101 01/19/2017   BILITOT 0.8 01/19/2017   GFRNONAA >60 01/19/2017   GFRAA >60 01/19/2017    Lab Results  Component Value Date   WBC 6.0 01/18/2018   NEUTROABS 4.4 01/18/2018   HGB 12.6 (L) 01/18/2018   HCT 38.3 (L) 01/18/2018   MCV 83.5 01/18/2018   PLT 68 (L) 01/18/2018   Lab Results  Component Value Date   IRON 46 01/18/2018   TIBC 419 01/18/2018   IRONPCTSAT 11 (L) 01/18/2018   Lab Results  Component Value Date   FERRITIN 10 (L) 01/18/2018    STUDIES: No results found.  ASSESSMENT: Iron deficiency anemia, thrombocytopenia  PLAN:    1.  Iron deficiency anemia-likely secondary to GI blood loss.  Colonoscopy and EGD on 02/02/2017 removed 6 polyps, noted esophageal varices and portal hypertension.   Patient had reported some possible melena and his hemoglobin had trended down significantly.  He continues oral iron supplementation but counts have not improved significantly.  Last received IV Feraheme x 2 doses in March 2019. Today- Hmg 12.6, Hct- 38.3, Ferritin- 10, Saturation Ratio- 11, TIBC 419. Proceed with IV Feraheme today and return to clinic in 1 week for second infusion.   2. Thrombocytopenia: Slightly worse. Platelet count 68 today. Likely secondary to ongoing cirrhosis.  Abdominal ultrasound with Dr.  Norris on 01/25/2017 revealed cirrhotic changes and splenomegaly.  No unexplained bleeding or bruising at this time.  Continue to monitor.   3. Liver lesions: Patient noted to have several poorly defined low-attenuation lesions left hepatic lobe of CT scan on 06/02/2015.  AFP was within normal limits on 09/2017. History of alcoholic cirrhosis. No longer drinks. Per Dr.  Norris, recommendation was for repeat colonoscopy in 2021 based on pathology. Patient has not followed up with GI for repeat AFP and RUQ [place] (recommended in January 2019). This was encouraged today.  Proceed with IV iron today and repeat in 1 week. Oral iron refilled today. Follow up with Dr. [name] for repeat labs and consideration of Feraheme in 3 months.   Patient expressed understanding and was in agreement with this plan. He also understands that He can call clinic at any time with any questions, concerns, or complaints.   Beckey Rutter, DNP, AGNP-C [facility] [phone] (work cell) [phone] (office)

## 2018-01-19 NOTE — Progress Notes (Signed)
Patient denies any concerns today.  

## 2018-01-19 NOTE — Patient Instructions (Signed)
Polysaccharide-Iron Complex tablets or capsules What is this medicine? POLYSACCHARIDE-IRON COMPLEX (pol ee SAK uh rahyd - ahy ern KOM pleks) is an iron supplement. This medicine is used to treat anemia caused by low iron levels. This medicine may be used for other purposes; ask your health care provider or pharmacist if you have questions. COMMON BRAND NAME(S): EZFE, Ferrex 150, Ferrex-150, FerUS, iFerex 150, Nu-Iron, Poly-Iron What should I tell my health care provider before I take this medicine? They need to know if you have any of these conditions: -hemolytic anemia -iron overload (hemochromatosis, hemosiderosis) -liver disease -stomach or bowel disease -an unusual or allergic reaction to iron, other medicines, foods, dyes, or preservatives -pregnant or trying to get pregnant -breast-feeding How should I use this medicine? Take this medicine by mouth with a glass of water. Follow the directions on the prescription label. It is best to take iron on an empty stomach at least 30 minutes before or 2 hours after food. If this medicine causes stomach upset, you may take it with food. Take your medicine at regular intervals. Do not take your medicine more often than directed. Talk to your pediatrician regarding the use of this medicine in children. Special care may be needed. Overdosage: If you think you have taken too much of this medicine contact a poison control center or emergency room at once. NOTE: This medicine is only for you. Do not share this medicine with others. What if I miss a dose? If you miss a dose, take it as soon as you can. If it is almost time for your next dose, take only that dose. Do not take double or extra doses. What may interact with this medicine? Do not take this medicine with any of the following medications: -deferoxamine -dimercaprol -iron dextran -other iron supplements -sodium ferric gluconate complex This medicine may also interact with the following  medications: -antacids -certain antibiotics -levodopa -medicines for osteoporosis like alendronate, etidronate, risedronate and tiludronate -medicines for stomach problems like cimetidine, famotidine, omeprazole, lansoprazole -methyldopa -mycophenolate -penicillamine -thyroid hormones -zinc This list may not describe all possible interactions. Give your health care provider a list of all the medicines, herbs, non-prescription drugs, or dietary supplements you use. Also tell them if you smoke, drink alcohol, or use illegal drugs. Some items may interact with your medicine. What should I watch for while using this medicine? You should only use this medicine under the supervision of your health care professional. If you are told by your health care provider that you need iron supplements, you should visit your health care professional for regular blood checks. Do not use this medicine longer than prescribed, and do not take a higher dose than recommended. Long-term use may cause excess iron to build-up in the body. This medicine can cause temporary staining of the teeth. Mix in water and drink through a straw to prevent staining of the teeth. Stains can be reduced or removed by brushing the teeth with baking soda. Do not take this medicine with dairy products or antacids. If you need to take an antacid, take it 2 hours after a dose of iron. What side effects may I notice from receiving this medicine? Side effects that you should report to your doctor or health care professional as soon as possible: -allergic reactions like skin rash, itching or hives, swelling of the face, lips, or tongue -blue lips, nails, or palms -drowsiness -pale or clammy skin -seizures (convulsions) -stomach pain -vomiting -weak, fast, or irregular heartbeat Side effects that  usually do not require medical attention (report to your doctor or health care professional if they continue or are  bothersome): -constipation -indigestion -nausea or stomach upset This list may not describe all possible side effects. Call your doctor for medical advice about side effects. You may report side effects to FDA at 1-800-FDA-1088. Where should I keep my medicine? Keep out of the reach of children. Store at room temperature between 15 and 30 degrees C (59 and 86 degrees F). Keep container tightly closed. Throw away any unused medicine after the expiration date. NOTE: This sheet is a summary. It may not cover all possible information. If you have questions about this medicine, talk to your doctor, pharmacist, or health care provider.  2018 Elsevier/Gold Standard (2008-02-18 17:12:10)  Iron Deficiency Anemia, Adult Iron deficiency anemia is a condition in which the concentration of red blood cells or hemoglobin in the blood is below normal because of too little iron. Hemoglobin is a substance in red blood cells that carries oxygen to the body's tissues. When the concentration of red blood cells or hemoglobin is too low, not enough oxygen reaches these tissues. Iron deficiency anemia is usually long-lasting (chronic) and it develops over time. It may or may not cause symptoms. It is a common type of anemia. What are the causes? This condition may be caused by:  Not enough iron in the diet.  Blood loss caused by bleeding in the intestine.  Blood loss from a gastrointestinal condition like Crohn disease.  Frequent blood draws, such as from blood donation.  Abnormal absorption in the gut.  Heavy menstrual periods in women.  Cancers of the gastrointestinal system, such as colon cancer.  What are the signs or symptoms? Symptoms of this condition may include:  Fatigue.  Headache.  Pale skin, lips, and nail beds.  Poor appetite.  Weakness.  Shortness of breath.  Dizziness.  Cold hands and feet.  Fast or irregular heartbeat.  Irritability. This is more common in severe  anemia.  Rapid breathing. This is more common in severe anemia.  Mild anemia may not cause any symptoms. How is this diagnosed? This condition is diagnosed based on:  Your medical history.  A physical exam.  Blood tests.  You may have additional tests to find the underlying cause of your anemia, such as:  Testing for blood in the stool (fecal occult blood test).  A procedure to see inside your colon and rectum (colonoscopy).  A procedure to see inside your esophagus and stomach (endoscopy).  A test in which cells are removed from bone marrow (bone marrow aspiration) or fluid is removed from the bone marrow to be examined (biopsy). This is rarely needed.  How is this treated? This condition is treated by correcting the cause of your iron deficiency. Treatment may involve:  Adding iron-rich foods to your diet.  Taking iron supplements. If you are pregnant or breastfeeding, you may need to take extra iron because your normal diet usually does not provide the amount of iron that you need.  Increasing vitamin C intake. Vitamin C helps your body absorb iron. Your health care provider may recommend that you take iron supplements along with a glass of orange juice or a vitamin C supplement.  Medicines to make heavy menstrual flow lighter.  Surgery.  You may need repeat blood tests to determine whether treatment is working. Depending on the underlying cause, the anemia should be corrected within 2 months of starting treatment. If the treatment does not seem  to be working, you may need more testing. Follow these instructions at home: Medicines  Take over-the-counter and prescription medicines only as told by your health care provider. This includes iron supplements and vitamins.  If you cannot tolerate taking iron supplements by mouth, talk with your health care provider about taking them through a vein (intravenously) or an injection into a muscle.  For the best iron absorption,  you should take iron supplements when your stomach is empty. If you cannot tolerate them on an empty stomach, you may need to take them with food.  Do not drink milk or take antacids at the same time as your iron supplements. Milk and antacids may interfere with iron absorption.  Iron supplements can cause constipation. To prevent constipation, include fiber in your diet as told by your health care provider. A stool softener may also be recommended. Eating and drinking  Talk with your health care provider before changing your diet. He or she may recommend that you eat foods that contain a lot of iron, such as: ? Liver. ? Low-fat (lean) beef. ? Breads and cereals that have iron added to them (are fortified). ? Eggs. ? Dried fruit. ? Dark green, leafy vegetables.  To help your body use the iron from iron-rich foods, eat those foods at the same time as fresh fruits and vegetables that are high in vitamin C. Foods that are high in vitamin C include: ? Oranges. ? Peppers. ? Tomatoes. ? Mangoes.  Drinkenoughfluid to keep your urine clear or pale yellow. General instructions  Return to your normal activities as told by your health care provider. Ask your health care provider what activities are safe for you.  Practice good hygiene. Anemia can make you more prone to illness and infection.  Keep all follow-up visits as told by your health care provider. This is important. Contact a health care provider if:  You feel nauseous or you vomit.  You feel weak.  You have unexplained sweating.  You develop symptoms of constipation, such as: ? Having fewer than three bowel movements a week. ? Straining to have a bowel movement. ? Having stools that are hard, dry, or larger than normal. ? Feeling full or bloated. ? Pain in the lower abdomen. ? Not feeling relief after having a bowel movement. Get help right away if:  You faint. If this happens, do not drive yourself to the hospital. Call  your local emergency services (911 in the [place]  You have chest pain.  You have shortness of breath that: ? Is severe. ? Gets worse with physical activity.  You have a rapid heartbeat.  You become light-headed when getting up from a sitting or lying down position. This information is not intended to replace advice given to you by your health care provider. Make sure you discuss any questions you have with your health care provider. Document Released: 07/15/2000 Document Revised: 04/06/2016 Document Reviewed: 04/06/2016 Elsevier Interactive Patient Education  2018 [employer].  Cirrhosis Cirrhosis is long-term (chronic) liver injury. The liver is your largest internal organ, and it performs many functions. The liver converts food into energy, removes toxic material from your blood, makes important proteins, and absorbs necessary vitamins from your diet. If you have cirrhosis, it means many of your healthy liver cells have been replaced by scar tissue. This prevents blood from flowing through your liver, which makes it difficult for your liver to function. This scarring is not reversible, but treatment can prevent it from getting worse.  What are the causes? Hepatitis C and long-term alcohol abuse are the most common causes of cirrhosis. Other causes include:  Nonalcoholic fatty liver disease.  Hepatitis B infection.  Autoimmune hepatitis.  Diseases that cause blockage of ducts inside the liver.  Inherited liver diseases.  Reactions to certain long-term medicines.  Parasitic infections.  Long-term exposure to certain toxins.  What increases the risk? You may have a higher risk of cirrhosis if you:  Have certain hepatitis viruses.  Abuse alcohol, especially if you are male.  Are overweight.  Share needles.  Have unprotected sex with someone who has hepatitis.  What are the signs or symptoms? You may not have any signs and symptoms at first. Symptoms may not develop  until the damage to your liver starts to get worse. Signs and symptoms of cirrhosis may include:  Tenderness in the right-upper part of your abdomen.  Weakness and tiredness (fatigue).  Loss of appetite.  Nausea.  Weight loss and muscle loss.  Itchiness.  Yellow skin and eyes (jaundice).  Buildup of fluid in the abdomen (ascites).  Swelling of the feet and ankles (edema).  Appearance of tiny blood vessels under the skin.  Mental confusion.  Easy bruising and bleeding.  How is this diagnosed? Your health care provider may suspect cirrhosis based on your symptoms and medical history, especially if you have other medical conditions or a history of alcohol abuse. Your health care provider will do a physical exam to feel your liver and check for signs of cirrhosis. Your health care provider may perform other tests, including:  Blood tests to check: ? Whether you have hepatitis B or C. ? Kidney function. ? Liver function.  Imaging tests such as: ? MRI or CT scan to look for changes seen in advanced cirrhosis. ? Ultrasound to see if normal liver tissue is being replaced by scar tissue.  A procedure using a long needle to take a sample of liver tissue (biopsy) for examination under a microscope. Liver biopsy can confirm the diagnosis of cirrhosis.  How is this treated? Treatment depends on how damaged your liver is and what caused the damage. Treatment may include treating cirrhosis symptoms or treating the underlying causes of the condition to try to slow the progression of the damage. Treatment may include:  Making lifestyle changes, such as: ? Eating a healthy diet. ? Restricting salt intake. ? Maintaining a healthy weight. ? Not abusing drugs or alcohol.  Taking medicines to: ? Treat liver infections or other infections. ? Control itching. ? Reduce fluid buildup. ? Reduce certain blood toxins. ? Reduce risk of bleeding from enlarged blood vessels in the stomach or  esophagus (varices).  If varices are causing bleeding problems, you may need treatment with a procedure that ties up the vessels causing them to fall off (band ligation).  If cirrhosis is causing your liver to fail, your health care provider may recommend a liver transplant.  Other treatments may be recommended depending on any complications of cirrhosis, such as liver-related kidney failure (hepatorenal syndrome).  Follow these instructions at home:  Take medicines only as directed by your health care provider. Do not use drugs that are toxic to your liver. Ask your health care provider before taking any new medicines, including over-the-counter medicines.  Rest as needed.  Eat a well-balanced diet. Ask your health care provider or dietitian for more information.  You may have to follow a low-salt diet or restrict your water intake as directed.  Do not drink  alcohol. This is especially important if you are taking acetaminophen.  Keep all follow-up visits as directed by your health care provider. This is important. Contact a health care provider if:  You have fatigue or weakness that is getting worse.  You develop swelling of the hands, feet, legs, or face.  You have a fever.  You develop loss of appetite.  You have nausea or vomiting.  You develop jaundice.  You develop easy bruising or bleeding. Get help right away if:  You vomit bright red blood or a material that looks like coffee grounds.  You have blood in your stools.  Your stools appear black and tarry.  You become confused.  You have chest pain or trouble breathing. This information is not intended to replace advice given to you by your health care provider. Make sure you discuss any questions you have with your health care provider. Document Released: 07/18/2005 Document Revised: 11/26/2015 Document Reviewed: 03/26/2014 Elsevier Interactive Patient Education  [employer].

## 2018-01-26 ENCOUNTER — Inpatient Hospital Stay: Payer: 59

## 2018-01-26 VITALS — BP 161/77 | HR 71 | Temp 97.5°F | Resp 18

## 2018-01-26 DIAGNOSIS — D500 Iron deficiency anemia secondary to blood loss (chronic): Secondary | ICD-10-CM | POA: Diagnosis not present

## 2018-01-26 MED ORDER — FERUMOXYTOL INJECTION 510 MG/17 ML
INTRAVENOUS | Status: AC
  Filled 2018-01-26: qty 17

## 2018-01-26 MED ORDER — SODIUM CHLORIDE 0.9 % IV SOLN
510.0000 mg | Freq: Once | INTRAVENOUS | Status: AC
  Administered 2018-01-26: 510 mg via INTRAVENOUS
  Filled 2018-01-26: qty 17

## 2018-01-26 NOTE — Patient Instructions (Signed)

## 2018-04-15 NOTE — Progress Notes (Deleted)
[facility]  [phone] [phone]  ID: [name] OB: [birth year]  MR#: [id number]  TGG#:[id number]  Patient Care Team: [name], DO as PCP - General (Family Medicine) Lloyd Huger, MD as Consulting Physician (Hematology and Oncology)  CHIEF COMPLAINT: Iron deficiency anemia, thrombocytopenia  INTERVAL HISTORY: Patient returns to clinic today for repeat laboratory work, further evaluation, and consideration of IV Feraheme. He currently feels well and is asymptomatic. He does not complain of easy bleeding or bruising, although admits to some possible melena several weeks ago that resolved spontaneously.  He does not complain of weakness or fatigue. He has no neurologic complaints. He denies any recent fevers or illnesses. He has a good appetite and denies weight loss. He denies any chest pain or shortness of breath. He denies any nausea, vomiting, constipation, or diarrhea. He denies any hematochezia. He has no urinary complaints. Patient offers no further specific complaints today.  REVIEW OF SYSTEMS:   Review of Systems  Constitutional: Negative.  Negative for fever, malaise/fatigue and weight loss.  Respiratory: Negative.  Negative for cough, hemoptysis and shortness of breath.   Cardiovascular: Negative.  Negative for chest pain and leg swelling.  Gastrointestinal: Positive for melena. Negative for abdominal pain and blood in stool.  Genitourinary: Negative.  Negative for hematuria.  Musculoskeletal: Negative.   Skin: Negative.  Negative for rash.  Neurological: Negative.  Negative for sensory change and weakness.  Psychiatric/Behavioral: Negative.  The patient is not nervous/anxious.     As per HPI. Otherwise, a complete review of systems is negative.  PAST MEDICAL HISTORY: Past Medical History:  Diagnosis Date  . Anemia   . Cancer ([place])   . Cirrhosis ([place])   . Colon polyps   .  Constipation   . Diarrhea   . Hemorrhoids   . Hypertension    in past  . Portal vein thrombosis    see chart review 06/05/15  . Shingles 09/2016  . Stomach ulcer   . Weight loss     PAST SURGICAL HISTORY: Past Surgical History:  Procedure Laterality Date  . COLONOSCOPY N/A 06/17/2015   Procedure: COLONOSCOPY;  Surgeon: Hulen Luster, MD;  Location: [place] [place];  Service: Gastroenterology;  Laterality: N/A;  . COLONOSCOPY WITH PROPOFOL N/A 02/02/2017   Procedure: COLONOSCOPY WITH PROPOFOL;  Surgeon: [name], MD;  Location: [place];  Service: Endoscopy;  Laterality: N/A;  . ESOPHAGOGASTRODUODENOSCOPY N/A 05/14/2015   Procedure: ESOPHAGOGASTRODUODENOSCOPY (EGD);  Surgeon: Hulen Luster, MD;  Location: [facility] ENDOSCOPY;  Service: Endoscopy;  Laterality: N/A;  . ESOPHAGOGASTRODUODENOSCOPY (EGD) WITH PROPOFOL N/A 02/02/2017   Procedure: ESOPHAGOGASTRODUODENOSCOPY (EGD) WITH PROPOFOL;  Surgeon: [name], MD;  Location: [place];  Service: Endoscopy;  Laterality: N/A;  . POLYPECTOMY  02/02/2017   Procedure: POLYPECTOMY;  Surgeon: [name], MD;  Location: [place];  Service: Endoscopy;;    FAMILY HISTORY: Family History  Problem Relation Age of Onset  . CAD Father   . Heart attack Father     ADVANCED DIRECTIVES (Y/N):  N  HEALTH MAINTENANCE: Social History   Tobacco Use  . Smoking status: Current Every Day Smoker    Packs/day: 2.00    Years: 40.00    Pack years: 80.00    Types: Cigarettes  . Smokeless tobacco: Never Used  . Tobacco comment: Only successfully quit for 1 month before due to acute illness. (01/24/17 - down to 1.5 PPD)  Substance Use Topics  . 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  . Drug use: No     Colonoscopy:  PAP:  Bone density:  Lipid panel:  Allergies  Allergen Reactions  . Iron Nausea Only  . Lorazepam     "made me anxious and mean"     Current Outpatient Medications  Medication  Sig Dispense Refill  . famotidine (PEPCID) 20 MG tablet Take 1 tablet (20 mg total) by mouth 2 (two) times daily. 60 tablet 0  . iron polysaccharides (NIFEREX) 150 MG capsule Take 1 capsule (150 mg total) by mouth daily. 90 capsule 0  . loratadine (CLARITIN) 10 MG tablet Take 10 mg by mouth daily.     No current facility-administered medications for this visit.     OBJECTIVE: There were no vitals filed for this visit.   There is no height or weight on file to calculate BMI.    ECOG FS:0 - Asymptomatic  General: Well-developed, well-nourished, no acute distress. Eyes: Pink conjunctiva, anicteric sclera. Lungs: Clear to auscultation bilaterally. Heart: Regular rate and rhythm. No rubs, murmurs, or gallops. Abdomen: Soft, nontender, nondistended. No organomegaly noted, normoactive bowel sounds. Musculoskeletal: No edema, cyanosis, or clubbing. Neuro: Alert, answering all questions appropriately. Cranial nerves grossly intact. Skin: No rashes or petechiae noted. Psych: Normal affect.   LAB RESULTS:  Lab Results  Component Value Date   NA 141 01/19/2017   K 3.1 (L) 01/19/2017   CL 105 01/19/2017   CO2 26 01/19/2017   GLUCOSE 92 01/19/2017   BUN 8 01/19/2017   CREATININE 0.64 01/19/2017   CALCIUM 9.3 01/19/2017   PROT 7.2 01/19/2017   ALBUMIN 4.4 01/19/2017   AST 19 01/19/2017   ALT 19 01/19/2017   ALKPHOS 101 01/19/2017   BILITOT 0.8 01/19/2017   GFRNONAA >60 01/19/2017   GFRAA >60 01/19/2017    Lab Results  Component Value Date   WBC 6.0 01/18/2018   NEUTROABS 4.4 01/18/2018   HGB 12.6 (L) 01/18/2018   HCT 38.3 (L) 01/18/2018   MCV 83.5 01/18/2018   PLT 68 (L) 01/18/2018   Lab Results  Component Value Date   IRON 46 01/18/2018   TIBC 419 01/18/2018   IRONPCTSAT 11 (L) 01/18/2018   Lab Results  Component Value Date   FERRITIN 10 (L) 01/18/2018     STUDIES: No results found.  ASSESSMENT: Iron deficiency anemia, thrombocytopenia  PLAN:    1. Iron  deficiency anemia: Likely secondary to GI blood loss. Colonoscopy and EGD completed on February 02, 2017 removed 6 polyps noted esophageal varices as well as portal hypertension.  Patient reports some possible melena and his hemoglobin has trended down significantly.  Iron stores are pending at time of dictation. The remainder of his laboratory work is either negative or within normal limits.  Patient cannot tolerate oral iron supplementation.  Proceed with 510 mg IV Feraheme today.  Return to clinic in 1 week for second infusion and then in 3 months with repeat laboratory work and further evaluation. 2. Thrombocytopenia: Slightly improved.  Likely secondary to ongoing cirrhosis. Abdominal ultrasound on January 25, 2017 did not report any splenomegaly. No intervention is needed at this time. Monitor.  3. Liver lesions: Patient was noted to have several poorly defined low-attenuation lesions in his left hepatic lobe on CT scan on June 02, 2015. No follow-up CT scan has been completed for evaluation. Patient's AFP is within normal limits. Will defer any repeat imaging to GI.  Patient expressed understanding and was in agreement  with this plan. He also understands that He can call clinic at any time with any questions, concerns, or complaints.    Lloyd Huger, MD   04/15/2018 4:07 PM

## 2018-04-19 ENCOUNTER — Other Ambulatory Visit: Payer: 59

## 2018-04-20 ENCOUNTER — Inpatient Hospital Stay
Admission: EM | Admit: 2018-04-20 | Discharge: 2018-04-22 | DRG: 432 | Disposition: A | Discharge status: Home / Self Care | Payer: Commercial Managed Care - HMO | Attending: Internal Medicine | Admitting: Internal Medicine

## 2018-04-20 ENCOUNTER — Telehealth: Payer: Self-pay | Admitting: Nurse Practitioner

## 2018-04-20 ENCOUNTER — Ambulatory Visit: Payer: 59 | Admitting: Oncology

## 2018-04-20 ENCOUNTER — Inpatient Hospital Stay: Payer: Commercial Managed Care - HMO

## 2018-04-20 ENCOUNTER — Ambulatory Visit: Payer: 59

## 2018-04-20 ENCOUNTER — Other Ambulatory Visit: Payer: Self-pay

## 2018-04-20 DIAGNOSIS — Z23 Encounter for immunization: Secondary | ICD-10-CM

## 2018-04-20 DIAGNOSIS — Z888 Allergy status to other drugs, medicaments and biological substances status: Secondary | ICD-10-CM | POA: Diagnosis not present

## 2018-04-20 DIAGNOSIS — K259 Gastric ulcer, unspecified as acute or chronic, without hemorrhage or perforation: Secondary | ICD-10-CM | POA: Diagnosis not present

## 2018-04-20 DIAGNOSIS — Z8701 Personal history of pneumonia (recurrent): Secondary | ICD-10-CM | POA: Diagnosis not present

## 2018-04-20 DIAGNOSIS — I81 Portal vein thrombosis: Secondary | ICD-10-CM

## 2018-04-20 DIAGNOSIS — I8511 Secondary esophageal varices with bleeding: Secondary | ICD-10-CM | POA: Diagnosis present

## 2018-04-20 DIAGNOSIS — K219 Gastro-esophageal reflux disease without esophagitis: Secondary | ICD-10-CM | POA: Diagnosis present

## 2018-04-20 DIAGNOSIS — K3189 Other diseases of stomach and duodenum: Secondary | ICD-10-CM | POA: Diagnosis present

## 2018-04-20 DIAGNOSIS — K921 Melena: Secondary | ICD-10-CM | POA: Diagnosis not present

## 2018-04-20 DIAGNOSIS — D696 Thrombocytopenia, unspecified: Secondary | ICD-10-CM

## 2018-04-20 DIAGNOSIS — K766 Portal hypertension: Secondary | ICD-10-CM | POA: Diagnosis not present

## 2018-04-20 DIAGNOSIS — Z79899 Other long term (current) drug therapy: Secondary | ICD-10-CM

## 2018-04-20 DIAGNOSIS — D649 Anemia, unspecified: Secondary | ICD-10-CM

## 2018-04-20 DIAGNOSIS — D500 Iron deficiency anemia secondary to blood loss (chronic): Secondary | ICD-10-CM | POA: Diagnosis present

## 2018-04-20 DIAGNOSIS — Z86010 Personal history of colonic polyps: Secondary | ICD-10-CM

## 2018-04-20 DIAGNOSIS — F17210 Nicotine dependence, cigarettes, uncomplicated: Secondary | ICD-10-CM | POA: Diagnosis present

## 2018-04-20 DIAGNOSIS — K7030 Alcoholic cirrhosis of liver without ascites: Principal | ICD-10-CM | POA: Diagnosis present

## 2018-04-20 DIAGNOSIS — I10 Essential (primary) hypertension: Secondary | ICD-10-CM | POA: Diagnosis present

## 2018-04-20 DIAGNOSIS — I8510 Secondary esophageal varices without bleeding: Secondary | ICD-10-CM | POA: Diagnosis not present

## 2018-04-20 DIAGNOSIS — D509 Iron deficiency anemia, unspecified: Secondary | ICD-10-CM | POA: Diagnosis not present

## 2018-04-20 DIAGNOSIS — K922 Gastrointestinal hemorrhage, unspecified: Secondary | ICD-10-CM | POA: Diagnosis present

## 2018-04-20 DIAGNOSIS — Z8249 Family history of ischemic heart disease and other diseases of the circulatory system: Secondary | ICD-10-CM

## 2018-04-20 DIAGNOSIS — K769 Liver disease, unspecified: Secondary | ICD-10-CM

## 2018-04-20 DIAGNOSIS — D6959 Other secondary thrombocytopenia: Secondary | ICD-10-CM | POA: Diagnosis present

## 2018-04-20 DIAGNOSIS — K279 Peptic ulcer, site unspecified, unspecified as acute or chronic, without hemorrhage or perforation: Secondary | ICD-10-CM | POA: Diagnosis not present

## 2018-04-20 DIAGNOSIS — R857 Abnormal histological findings in specimens from digestive organs and abdominal cavity: Secondary | ICD-10-CM | POA: Diagnosis not present

## 2018-04-20 DIAGNOSIS — K7460 Unspecified cirrhosis of liver: Secondary | ICD-10-CM

## 2018-04-20 LAB — CBC WITH DIFFERENTIAL/PLATELET
Basophils Absolute: 0.1 10*3/uL (ref 0–0.1)
Basophils Relative: 1 %
EOS ABS: 0.1 10*3/uL (ref 0–0.7)
EOS PCT: 1 %
HCT: 20.8 % — ABNORMAL LOW (ref 40.0–52.0)
Hemoglobin: 6.5 g/dL — ABNORMAL LOW (ref 13.0–18.0)
LYMPHS ABS: 1 10*3/uL (ref 1.0–3.6)
Lymphocytes Relative: 15 %
MCH: 23.9 pg — ABNORMAL LOW (ref 26.0–34.0)
MCHC: 31.4 g/dL — AB (ref 32.0–36.0)
MCV: 76.1 fL — ABNORMAL LOW (ref 80.0–100.0)
MONOS PCT: 20 %
Monocytes Absolute: 1.3 10*3/uL — ABNORMAL HIGH (ref 0.2–1.0)
Neutro Abs: 4.1 10*3/uL (ref 1.4–6.5)
Neutrophils Relative %: 63 %
PLATELETS: 107 10*3/uL — AB (ref 150–440)
RBC: 2.73 MIL/uL — ABNORMAL LOW (ref 4.40–5.90)
RDW: 20.9 % — AB (ref 11.5–14.5)
WBC: 6.5 10*3/uL (ref 3.8–10.6)

## 2018-04-20 LAB — COMPREHENSIVE METABOLIC PANEL
ALBUMIN: 3.6 g/dL (ref 3.5–5.0)
ALT: 17 U/L (ref 0–44)
AST: 19 U/L (ref 15–41)
Alkaline Phosphatase: 65 U/L (ref 38–126)
Anion gap: 8 (ref 5–15)
BILIRUBIN TOTAL: 0.3 mg/dL (ref 0.3–1.2)
BUN: 55 mg/dL — AB (ref 6–20)
CHLORIDE: 112 mmol/L — AB (ref 98–111)
CO2: 20 mmol/L — ABNORMAL LOW (ref 22–32)
CREATININE: 0.74 mg/dL (ref 0.61–1.24)
Calcium: 9.4 mg/dL (ref 8.9–10.3)
Glucose, Bld: 109 mg/dL — ABNORMAL HIGH (ref 70–99)
Potassium: 4.2 mmol/L (ref 3.5–5.1)
Sodium: 140 mmol/L (ref 135–145)
TOTAL PROTEIN: 5.9 g/dL — AB (ref 6.5–8.1)

## 2018-04-20 LAB — CBC
HCT: 20.5 % — ABNORMAL LOW (ref 40.0–52.0)
Hemoglobin: 6.4 g/dL — ABNORMAL LOW (ref 13.0–18.0)
MCH: 24 pg — ABNORMAL LOW (ref 26.0–34.0)
MCHC: 31.2 g/dL — ABNORMAL LOW (ref 32.0–36.0)
MCV: 76.9 fL — AB (ref 80.0–100.0)
PLATELETS: 105 10*3/uL — AB (ref 150–440)
RBC: 2.66 MIL/uL — ABNORMAL LOW (ref 4.40–5.90)
RDW: 20.8 % — AB (ref 11.5–14.5)
WBC: 6.3 10*3/uL (ref 3.8–10.6)

## 2018-04-20 LAB — IRON AND TIBC
Iron: 20 ug/dL — ABNORMAL LOW (ref 45–182)
SATURATION RATIOS: 5 % — AB (ref 17.9–39.5)
TIBC: 411 ug/dL (ref 250–450)
UIBC: 391 ug/dL

## 2018-04-20 LAB — FERRITIN: Ferritin: 7 ng/mL — ABNORMAL LOW (ref 24–336)

## 2018-04-20 LAB — PREPARE RBC (CROSSMATCH)

## 2018-04-20 MED ORDER — PANTOPRAZOLE SODIUM 40 MG IV SOLR: 40.0000 mg | Freq: Two times a day (BID) | INTRAVENOUS | Status: DC

## 2018-04-20 MED ORDER — SODIUM CHLORIDE 0.9 % IV SOLN
10.0000 mL/h | Freq: Once | INTRAVENOUS | Status: AC
  Administered 2018-04-20: 10 mL/h via INTRAVENOUS

## 2018-04-20 MED ORDER — ONDANSETRON HCL 4 MG PO TABS: 4.0000 mg | ORAL_TABLET | Freq: Four times a day (QID) | ORAL | Status: DC | PRN

## 2018-04-20 MED ORDER — SODIUM CHLORIDE 0.9 % IV SOLN
8.0000 mg/h | INTRAVENOUS | Status: DC
  Administered 2018-04-20 – 2018-04-22 (×5): 8 mg/h via INTRAVENOUS
  Filled 2018-04-20 (×5): qty 80

## 2018-04-20 MED ORDER — ONDANSETRON HCL 4 MG/2ML IJ SOLN: 4.0000 mg | Freq: Four times a day (QID) | INTRAMUSCULAR | Status: DC | PRN

## 2018-04-20 MED ORDER — SODIUM CHLORIDE 0.9 % IV SOLN
80.0000 mg | Freq: Once | INTRAVENOUS | Status: AC
  Administered 2018-04-20: 80 mg via INTRAVENOUS
  Filled 2018-04-20: qty 80

## 2018-04-20 MED ORDER — SODIUM CHLORIDE 0.9 % IV SOLN
INTRAVENOUS | Status: AC
  Administered 2018-04-20 – 2018-04-21 (×2): via INTRAVENOUS

## 2018-04-20 MED ORDER — PNEUMOCOCCAL VAC POLYVALENT 25 MCG/0.5ML IJ INJ
0.5000 mL | INJECTION | INTRAMUSCULAR | Status: AC
  Administered 2018-04-21: 0.5 mL via INTRAMUSCULAR
  Filled 2018-04-20: qty 0.5

## 2018-04-20 MED ORDER — INFLUENZA VAC SPLIT QUAD 0.5 ML IM SUSY
0.5000 mL | PREFILLED_SYRINGE | INTRAMUSCULAR | Status: AC
  Administered 2018-04-21: 0.5 mL via INTRAMUSCULAR
  Filled 2018-04-20: qty 0.5

## 2018-04-20 NOTE — ED Notes (Signed)
Report given to floor nurse

## 2018-04-20 NOTE — ED Notes (Signed)
After 30cc rate increased to 250cc/hr

## 2018-04-20 NOTE — H&P (Signed)
[facility] [place] NAME: Manuel Jimenez    MR#:  [id number]  DATE OF BIRTH:  09/29/1960  DATE OF ADMISSION:  04/20/2018  PRIMARY CARE PHYSICIAN: [name], DO   REQUESTING/REFERRING PHYSICIAN: Cinda Quest, MD  CHIEF COMPLAINT:   Chief Complaint  Patient presents with  . GI Bleeding  . Abnormal Lab    HISTORY OF PRESENT ILLNESS:  Manuel Jimenez  is a 57 y.o. male who presents with chief complaint as above.  Patient presents to the ED after an episode of large-volume melena.  He states that he has had some increased fatigue over the last couple of days.  Here in the ED he was found to have a hemoglobin of 6.4, this is down from normal values in the past on chart review.  He does have a history of having a GI bleed before, and was told he had diverticulitis at some point in the past, and at another point that he had a peptic ulcer.  He was started on IV pantoprazole, ordered for 2 units of PRBC transfusion, and hospitalist were called for admission  PAST MEDICAL HISTORY:   Past Medical History:  Diagnosis Date  . Anemia   . Cancer ([place])   . Cirrhosis ([place])   . Colon polyps   . Constipation   . Diarrhea   . Hemorrhoids   . Hypertension    in past  . Portal vein thrombosis    see chart review 06/05/15  . Shingles 09/2016  . Stomach ulcer   . Weight loss      PAST SURGICAL HISTORY:   Past Surgical History:  Procedure Laterality Date  . COLONOSCOPY N/A 06/17/2015   Procedure: COLONOSCOPY;  Surgeon: Hulen Luster, MD;  Location: [facility];  Service: Gastroenterology;  Laterality: N/A;  . COLONOSCOPY WITH PROPOFOL N/A 02/02/2017   Procedure: COLONOSCOPY WITH PROPOFOL;  Surgeon: [name], MD;  Location: [place];  Service: Endoscopy;  Laterality: N/A;  . ESOPHAGOGASTRODUODENOSCOPY N/A 05/14/2015   Procedure: ESOPHAGOGASTRODUODENOSCOPY (EGD);  Surgeon: Hulen Luster, MD;  Location[facility] ENDOSCOPY;   Service: Endoscopy;  Laterality: N/A;  . ESOPHAGOGASTRODUODENOSCOPY (EGD) WITH PROPOFOL N/A 02/02/2017   Procedure: ESOPHAGOGASTRODUODENOSCOPY (EGD) WITH PROPOFOL;  Surgeon: [name], MD;  Location: [place];  Service: Endoscopy;  Laterality: N/A;  . POLYPECTOMY  02/02/2017   Procedure: POLYPECTOMY;  Surgeon: [name], MD;  Location: [place];  Service: Endoscopy;;     SOCIAL HISTORY:   Social History   Tobacco Use  . Smoking status: Current Every Day Smoker    Packs/day: 2.00    Years: 40.00    Pack years: 80.00    Types: Cigarettes  . Smokeless tobacco: Never Used  . Tobacco comment: Only successfully quit for 1 month before due to acute illness. (01/24/17 - down to 1.5 PPD)  Substance Use Topics  . 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     FAMILY HISTORY:   Family History  Problem Relation Age of Onset  . CAD Father   . Heart attack Father      DRUG ALLERGIES:   Allergies  Allergen Reactions  . Lorazepam Other (See Comments)    "made me anxious and mean"   . Iron Nausea Only    MEDICATIONS AT HOME:   Prior to Admission medications   Medication Sig Start Date End Date Taking? Authorizing Provider  famotidine (PEPCID)  20 MG tablet Take 1 tablet (20 mg total) by mouth 2 (two) times daily. Patient taking differently: Take 20 mg by mouth 2 (two) times daily as needed for heartburn or indigestion.  02/02/17 04/20/18 Yes Karamalegos, [name], DO  iron polysaccharides (NIFEREX) 150 MG capsule Take 1 capsule (150 mg total) by mouth daily. 01/19/18  Yes Verlon Au, NP  loratadine (CLARITIN) 10 MG tablet Take 10 mg by mouth daily.   Yes [provider]    REVIEW OF SYSTEMS:  Review of Systems  Constitutional: Positive for malaise/fatigue. Negative for chills, fever and weight loss.  HENT: Negative for ear pain, hearing loss and tinnitus.   Eyes: Negative for blurred vision, double vision, pain and  redness.  Respiratory: Negative for cough, hemoptysis and shortness of breath.   Cardiovascular: Negative for chest pain, palpitations, orthopnea and leg swelling.  Gastrointestinal: Positive for melena. Negative for abdominal pain, constipation, diarrhea, nausea and vomiting.  Genitourinary: Negative for dysuria, frequency and hematuria.  Musculoskeletal: Negative for back pain, joint pain and neck pain.  Skin:       No acne, rash, or lesions  Neurological: Negative for dizziness, tremors, focal weakness and weakness.  Endo/Heme/Allergies: Negative for polydipsia. Does not bruise/bleed easily.  Psychiatric/Behavioral: Negative for depression. The patient is not nervous/anxious and does not have insomnia.      VITAL SIGNS:   Vitals:   04/20/18 1747 04/20/18 1951 04/20/18 2050 04/20/18 2105  BP: (!) 152/67 139/64 127/63 119/63  Pulse: 89 91 85 88  Resp: 16 19 15 16   Temp: 98.4 F (36.9 C)  98.1 F (36.7 C) 98.2 F (36.8 C)  TempSrc: Oral  Oral Oral  SpO2: 100% 100% 100%   Weight: 63.5 kg     Height: 5\' 11"  (1.803 m)      Wt Readings from Last 3 Encounters:  04/20/18 63.5 kg  01/19/18 64.9 kg  10/13/17 63.5 kg    PHYSICAL EXAMINATION:  Physical Exam  Vitals reviewed. Constitutional: He is oriented to person, place, and time. He appears well-developed and well-nourished. No distress.  HENT:  Head: Normocephalic and atraumatic.  Mouth/Throat: Oropharynx is clear and moist.  Eyes: Pupils are equal, round, and reactive to light. Conjunctivae and EOM are normal. No scleral icterus.  Neck: Normal range of motion. Neck supple. No JVD present. No thyromegaly present.  Cardiovascular: Normal rate, regular rhythm and intact distal pulses. Exam reveals no gallop and no friction rub.  No murmur heard. Respiratory: Effort normal and breath sounds normal. No respiratory distress. He has no wheezes. He has no rales.  GI: Soft. Bowel sounds are normal. He exhibits no distension. There is  no tenderness.  Musculoskeletal: Normal range of motion. He exhibits no edema.  No arthritis, no gout  Lymphadenopathy:    He has no cervical adenopathy.  Neurological: He is alert and oriented to person, place, and time. No cranial nerve deficit.  No dysarthria, no aphasia  Skin: Skin is warm and dry. No rash noted. No erythema.  Psychiatric: He has a normal mood and affect. His behavior is normal. Judgment and thought content normal.    LABORATORY PANEL:   CBC Recent Labs  Lab 04/20/18 1749  WBC 6.3  HGB 6.4*  HCT 20.5*  PLT 105*   ------------------------------------------------------------------------------------------------------------------  Chemistries  Recent Labs  Lab 04/20/18 1749  NA 140  K 4.2  CL 112*  CO2 20*  GLUCOSE 109*  BUN 55*  CREATININE 0.74  CALCIUM 9.4  AST  19  ALT 17  ALKPHOS 65  BILITOT 0.3   ------------------------------------------------------------------------------------------------------------------  Cardiac Enzymes No results for input(s): TROPONINI in the last 168 hours. ------------------------------------------------------------------------------------------------------------------  RADIOLOGY:  No results found.  EKG:   Orders placed or performed during the hospital encounter of 04/20/18  . ED EKG  . ED EKG    IMPRESSION AND PLAN:  Principal Problem:   GI bleeding -PPI drip, keep n.p.o., PRBC transfusion as per HPI, GI consult Active Problems:   Alcoholic cirrhosis of liver without ascites ([place]) -avoid hepatotoxins, patient gives no history of varices   Thrombocytopenia ([facility]) -chronic, likely due to cirrhosis, platelet count is actually better than it has been historically   GERD (gastroesophageal reflux disease) -PPI as above  Chart review performed and case discussed with ED provider. Labs, imaging and/or ECG reviewed by provider and discussed with patient/family. Management plans discussed with the patient and/or  family.  DVT PROPHYLAXIS: Mechanical only  GI PROPHYLAXIS:  PPI   ADMISSION STATUS: Inpatient     CODE STATUS: Full Code Status History    Date Active Date Inactive Code Status Order ID Comments User Context   05/06/2015 2219 05/14/2015 1942 Full Code [id number]  Hower, Aaron Mose, MD ED      TOTAL TIME TAKING CARE OF THIS PATIENT: 45 minutes.   Manuel Jimenez 04/20/2018, 9:25 PM  [facility]  Office  [phone]  CC: Primary care physician; [name], DO  Note:  This document was prepared using Dragon voice recognition software and may include unintentional dictation errors.

## 2018-04-20 NOTE — ED Notes (Signed)
Clarified that pt will need 2nd unit of blood

## 2018-04-20 NOTE — ED Triage Notes (Signed)
Pt states that he had rectal bleeding Thursday AM, labs drawn today which showed hemoglobin of 7 per patient. Pt pale, endorses generalized weakness, and dizziness with standing. Does not take blood thinners.

## 2018-04-20 NOTE — Telephone Encounter (Signed)
Received results from lab drawn today which revealed hemoglobin 6.5.  Patient called regarding results and states that he has had episode of bloody stool earlier this week.  He has been feeling weak and tired.  Was out of town and was scheduled to see Dr. [name] for reevaluation today but was unable to make it back to town in time for his appointment and therefore only labs were drawn.  He has a history of iron deficiency anemia, secondary to GI blood loss , on oral and IV iron.  History of cirrhotic changes and splenomegaly per abdominal ultrasound by Dr.  Norris.   Results discussed with Dr. [name] who recommends ER for evaluation of GI bleed and blood transfusion.  Patient advised of Dr. Gary Fleet recommendation and he agrees to proceed to ER.

## 2018-04-20 NOTE — ED Provider Notes (Addendum)
[facility] Emergency Department Provider Note   ____________________________________________   First MD Initiated Contact with Patient 04/20/18 1939     (approximate)  I have reviewed the triage vital signs and the nursing notes.   HISTORY  Chief Complaint GI Bleeding and Abnormal Lab    HPI Manuel Jimenez is a 57 y.o. male patient with black stools since Thursday.  He is getting weak and dizzy.  He does not take blood thinners but has had cirrhosis for 3 years.  He is not sure if he has varices.  Is anemic now he was not before.  He has no fever or belly pain.  Is also had a history of diverticulitis.  He has not had any alcohol for 3 years.   Past Medical History:  Diagnosis Date  . Anemia   . Cancer ([place])   . Cirrhosis ([place])   . Colon polyps   . Constipation   . Diarrhea   . Hemorrhoids   . Hypertension    in past  . Portal vein thrombosis    see chart review 06/05/15  . Shingles 09/2016  . Stomach ulcer   . Weight loss     Patient Active Problem List   Diagnosis Date Noted  . Liver lesion 01/19/2018  . Thrombocytopenia ([place]) 11/22/2016  . Herpes zoster without complication 16/05/9603  . Low serum HDL 06/18/2016  . Hypertension 04/18/2016  . GERD (gastroesophageal reflux disease) 04/18/2016  . Tobacco abuse 04/18/2016  . Heart murmur previously undiagnosed 04/18/2016  . Alcoholic cirrhosis of liver without ascites ([place]) 04/06/2016  . Diverticulitis of large intestine without perforation or abscess without bleeding 04/06/2016  . PVT (portal vein thrombosis) 04/06/2016  . Confusion   . Pneumonia   . Occult GI bleeding   . Aspiration pneumonia ([place])   . Moderate protein-calorie malnutrition ([place]) 05/08/2015  . Iron deficiency anemia due to chronic blood loss 05/06/2015  . Hypokalemia 05/06/2015    Past Surgical History:  Procedure Laterality Date  . COLONOSCOPY N/A 06/17/2015   Procedure: COLONOSCOPY;  Surgeon: Hulen Luster,  MD;  Location: [place];  Service: Gastroenterology;  Laterality: N/A;  . COLONOSCOPY WITH PROPOFOL N/A 02/02/2017   Procedure: COLONOSCOPY WITH PROPOFOL;  Surgeon: [name], MD;  Location: [place];  Service: Endoscopy;  Laterality: N/A;  . ESOPHAGOGASTRODUODENOSCOPY N/A 05/14/2015   Procedure: ESOPHAGOGASTRODUODENOSCOPY (EGD);  Surgeon: Hulen Luster, MD;  Location: [facility] ENDOSCOPY;  Service: Endoscopy;  Laterality: N/A;  . ESOPHAGOGASTRODUODENOSCOPY (EGD) WITH PROPOFOL N/A 02/02/2017   Procedure: ESOPHAGOGASTRODUODENOSCOPY (EGD) WITH PROPOFOL;  Surgeon: [name], MD;  Location: [place];  Service: Endoscopy;  Laterality: N/A;  . POLYPECTOMY  02/02/2017   Procedure: POLYPECTOMY;  Surgeon: [name], MD;  Location: [place];  Service: Endoscopy;;    Prior to Admission medications   Medication Sig Start Date End Date Taking? Authorizing Provider  famotidine (PEPCID) 20 MG tablet Take 1 tablet (20 mg total) by mouth 2 (two) times daily. 02/02/17 03/04/17  Karamalegos, [name], DO  iron polysaccharides (NIFEREX) 150 MG capsule Take 1 capsule (150 mg total) by mouth daily. 01/19/18   Verlon Au, NP  loratadine (CLARITIN) 10 MG tablet Take 10 mg by mouth daily.    [provider]    Allergies Iron and Lorazepam  Family History  Problem Relation Age of Onset  . CAD Father   . Heart attack Father     Social History Social History  Tobacco Use  . Smoking status: Current Every Day Smoker    Packs/day: 2.00    Years: 40.00    Pack years: 80.00    Types: Cigarettes  . Smokeless tobacco: Never Used  . Tobacco comment: Only successfully quit for 1 month before due to acute illness. (01/24/17 - down to 1.5 PPD)  Substance Use Topics  . 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  . Drug use: No    Review of Systems  Constitutional: No fever/chills Eyes: No visual changes. ENT: No sore  throat. Cardiovascular: Denies chest pain. Respiratory: Denies shortness of breath. Gastrointestinal: No abdominal pain.  No nausea, no vomiting.  No diarrhea.  No constipation. Genitourinary: Negative for dysuria. Musculoskeletal: Negative for back pain. Skin: Negative for rash. Neurological: Negative for headaches, focal weakness   ____________________________________________   PHYSICAL EXAM:  VITAL SIGNS: ED Triage Vitals [04/20/18 1747]  Enc Vitals Group     BP (!) 152/67     Pulse Rate 89     Resp 16     Temp 98.4 F (36.9 C)     Temp Source Oral     SpO2 100 %     Weight 140 lb (63.5 kg)     Height 5\' 11"  (1.803 m)     Head Circumference      Peak Flow      Pain Score 0     Pain Loc      Pain Edu?      Excl. in [place]?     Constitutional: Alert and oriented. Well appearing and in no acute distress. Eyes: Conjunctivae are normal.  Head: Atraumatic. Nose: No congestion/rhinnorhea. Mouth/Throat: Mucous membranes are moist.  Oropharynx non-erythematous. Neck: No stridor Cardiovascular: Normal rate, regular rhythm. Grossly normal heart sounds.  Good peripheral circulation. Respiratory: Normal respiratory effort.  No retractions. Lungs CTAB. Gastrointestinal: Soft and nontender. No distention. No abdominal bruits. No CVA tenderness. Rectal: No hemorrhoids.  Stool is very dark maroon. Musculoskeletal: No lower extremity tenderness nor edema.  No joint effusions. Neurologic:  Normal speech and language. No gross focal neurologic deficits are appreciated. No gait instability. Skin:  Skin is pale but warm, dry and intact. No rash noted. Psychiatric: Mood and affect are normal. Speech and behavior are normal.  ____________________________________________   LABS (all labs ordered are listed, but only abnormal results are displayed)  Labs Reviewed  COMPREHENSIVE METABOLIC PANEL - Abnormal; Notable for the following components:      Result Value   Chloride 112 (*)    CO2  20 (*)    Glucose, Bld 109 (*)    BUN 55 (*)    Total Protein 5.9 (*)    All other components within normal limits  CBC - Abnormal; Notable for the following components:   RBC 2.66 (*)    Hemoglobin 6.4 (*)    HCT 20.5 (*)    MCV 76.9 (*)    MCH 24.0 (*)    MCHC 31.2 (*)    RDW 20.8 (*)    Platelets 105 (*)    All other components within normal limits  TYPE AND SCREEN  PREPARE RBC (CROSSMATCH)   ____________________________________________  EKG  EKG read and interpreted by me shows normal sinus rhythm rate of 84 normal axis ____________________________________________  RADIOLOGY  ED MD interpretation:  Official radiology report(s): No results found.  ____________________________________________   PROCEDURES  Procedure(s) performed:  Procedures  Critical Care performed: Critical care time  20 minutes.  This includes speaking with the pharmacy about getting the Protonix speaking with the hospitalist and keeping an eye on the patient.  ____________________________________________   INITIAL IMPRESSION / ASSESSMENT AND PLAN / ED COURSE Patient with a GI bleed.  He has a history of known cirrhosis.  He is bleeding is symptomatic.  We will get him ready for transfusion plan on admitting him.  I have called the pharmacy for Protonix already.  By 845 patient is feeling better looking better more color in his skin.  He has received his first unit of blood.  He is doing well currently.        ____________________________________________   FINAL CLINICAL IMPRESSION(S) / ED DIAGNOSES  Final diagnoses:  Symptomatic anemia  Gastrointestinal hemorrhage, unspecified gastrointestinal hemorrhage type     ED Discharge Orders    None       Note:  This document was prepared using Dragon voice recognition software and may include unintentional dictation errors.    Nena Polio, MD 04/20/18 Lona Kettle    Nena Polio, MD 04/20/18 Patrecia Pour    Nena Polio,  MD 04/20/18 2114    Nena Polio, MD 04/20/18 2115

## 2018-04-21 ENCOUNTER — Encounter
Admission: EM | Disposition: A | Discharge status: Home / Self Care | Payer: Self-pay | Source: Home / Self Care | Attending: Internal Medicine

## 2018-04-21 ENCOUNTER — Inpatient Hospital Stay: Payer: Commercial Managed Care - HMO

## 2018-04-21 ENCOUNTER — Inpatient Hospital Stay: Payer: Commercial Managed Care - HMO | Admitting: Certified Registered"

## 2018-04-21 DIAGNOSIS — K7030 Alcoholic cirrhosis of liver without ascites: Principal | ICD-10-CM

## 2018-04-21 DIAGNOSIS — D500 Iron deficiency anemia secondary to blood loss (chronic): Secondary | ICD-10-CM

## 2018-04-21 DIAGNOSIS — K921 Melena: Secondary | ICD-10-CM

## 2018-04-21 DIAGNOSIS — K279 Peptic ulcer, site unspecified, unspecified as acute or chronic, without hemorrhage or perforation: Secondary | ICD-10-CM

## 2018-04-21 DIAGNOSIS — I8510 Secondary esophageal varices without bleeding: Secondary | ICD-10-CM

## 2018-04-21 HISTORY — PX: ESOPHAGOGASTRODUODENOSCOPY: SHX5428

## 2018-04-21 HISTORY — PX: ENTEROSCOPY: SHX5533

## 2018-04-21 LAB — BPAM RBC
BLOOD PRODUCT EXPIRATION DATE: 201910082359
BLOOD PRODUCT EXPIRATION DATE: 201910082359
ISSUE DATE / TIME: 201909202045
ISSUE DATE / TIME: 201909202247
UNIT TYPE AND RH: 600
Unit Type and Rh: 600

## 2018-04-21 LAB — HEMOGLOBIN AND HEMATOCRIT, BLOOD
HCT: 23.5 % — ABNORMAL LOW (ref 40.0–52.0)
HEMATOCRIT: 22.7 % — AB (ref 40.0–52.0)
HEMOGLOBIN: 7.8 g/dL — AB (ref 13.0–18.0)
Hemoglobin: 7.5 g/dL — ABNORMAL LOW (ref 13.0–18.0)

## 2018-04-21 LAB — TYPE AND SCREEN
ABO/RH(D): A POS
Antibody Screen: NEGATIVE
UNIT DIVISION: 0
UNIT DIVISION: 0

## 2018-04-21 LAB — CBC
HCT: 22.1 % — ABNORMAL LOW (ref 40.0–52.0)
Hemoglobin: 7.4 g/dL — ABNORMAL LOW (ref 13.0–18.0)
MCH: 26.4 pg (ref 26.0–34.0)
MCHC: 33.3 g/dL (ref 32.0–36.0)
MCV: 79.4 fL — AB (ref 80.0–100.0)
PLATELETS: 75 10*3/uL — AB (ref 150–440)
RBC: 2.79 MIL/uL — ABNORMAL LOW (ref 4.40–5.90)
RDW: 20.7 % — AB (ref 11.5–14.5)
WBC: 4.2 10*3/uL (ref 3.8–10.6)

## 2018-04-21 LAB — BASIC METABOLIC PANEL
Anion gap: 7 (ref 5–15)
BUN: 38 mg/dL — AB (ref 6–20)
CALCIUM: 8.2 mg/dL — AB (ref 8.9–10.3)
CHLORIDE: 114 mmol/L — AB (ref 98–111)
CO2: 22 mmol/L (ref 22–32)
CREATININE: 0.63 mg/dL (ref 0.61–1.24)
GFR calc Af Amer: >60 mL/min (ref >60)
GFR calc non Af Amer: >60 mL/min (ref >60)
GLUCOSE: 97 mg/dL (ref 70–99)
Potassium: 3.8 mmol/L (ref 3.5–5.1)
Sodium: 143 mmol/L (ref 135–145)

## 2018-04-21 LAB — AFP TUMOR MARKER: AFP, SERUM, TUMOR MARKER: 1.7 ng/mL (ref 0.0–8.3)

## 2018-04-21 LAB — FOLATE: Folate: 16.6 ng/mL (ref >5.9)

## 2018-04-21 LAB — VITAMIN B12: Vitamin B-12: 330 pg/mL (ref 180–914)

## 2018-04-21 LAB — MRSA PCR SCREENING: MRSA by PCR: NEGATIVE

## 2018-04-21 SURGERY — ENTEROSCOPY
Anesthesia: General

## 2018-04-21 SURGERY — EGD (ESOPHAGOGASTRODUODENOSCOPY)
Anesthesia: General

## 2018-04-21 MED ORDER — PROPOFOL 10 MG/ML IV BOLUS
INTRAVENOUS | Status: AC
  Filled 2018-04-21: qty 20

## 2018-04-21 MED ORDER — PROPOFOL 500 MG/50ML IV EMUL
INTRAVENOUS | Status: DC | PRN
  Administered 2018-04-21: 150 ug/kg/min via INTRAVENOUS

## 2018-04-21 MED ORDER — LACTATED RINGERS IV SOLN
INTRAVENOUS | Status: DC | PRN
  Administered 2018-04-21: 12:00:00 via INTRAVENOUS

## 2018-04-21 MED ORDER — ACETAMINOPHEN 325 MG PO TABS
650.0000 mg | ORAL_TABLET | Freq: Four times a day (QID) | ORAL | Status: DC | PRN
  Administered 2018-04-21: 650 mg via ORAL
  Filled 2018-04-21: qty 2

## 2018-04-21 MED ORDER — FENTANYL CITRATE (PF) 100 MCG/2ML IJ SOLN
INTRAMUSCULAR | Status: AC
  Filled 2018-04-21: qty 2

## 2018-04-21 MED ORDER — FENTANYL CITRATE (PF) 100 MCG/2ML IJ SOLN: 25.0000 ug | INTRAMUSCULAR | Status: DC | PRN

## 2018-04-21 MED ORDER — PROPOFOL 10 MG/ML IV BOLUS
INTRAVENOUS | Status: DC | PRN
  Administered 2018-04-21: 50 mg via INTRAVENOUS

## 2018-04-21 MED ORDER — OCTREOTIDE LOAD VIA INFUSION
50.0000 ug | Freq: Once | INTRAVENOUS | Status: AC
  Administered 2018-04-21: 50 ug via INTRAVENOUS
  Filled 2018-04-21: qty 25

## 2018-04-21 MED ORDER — LIDOCAINE HCL (CARDIAC) PF 100 MG/5ML IV SOSY
PREFILLED_SYRINGE | INTRAVENOUS | Status: DC | PRN
  Administered 2018-04-21: 80 mg via INTRAVENOUS

## 2018-04-21 MED ORDER — SODIUM CHLORIDE 0.9 % IV SOLN
1.0000 g | INTRAVENOUS | Status: DC
  Administered 2018-04-21 – 2018-04-22 (×2): 1 g via INTRAVENOUS
  Filled 2018-04-21 (×2): qty 1

## 2018-04-21 MED ORDER — SODIUM CHLORIDE 0.9 % IV SOLN
300.0000 mg | Freq: Once | INTRAVENOUS | Status: AC
  Administered 2018-04-21: 300 mg via INTRAVENOUS
  Filled 2018-04-21: qty 15

## 2018-04-21 MED ORDER — SODIUM CHLORIDE 0.9 % IV SOLN
50.0000 ug/h | INTRAVENOUS | Status: DC
  Administered 2018-04-21 – 2018-04-22 (×2): 50 ug/h via INTRAVENOUS
  Filled 2018-04-21 (×5): qty 1

## 2018-04-21 MED ORDER — FENTANYL CITRATE (PF) 100 MCG/2ML IJ SOLN
INTRAMUSCULAR | Status: DC | PRN
  Administered 2018-04-21: 50 ug via INTRAVENOUS

## 2018-04-21 NOTE — Transfer of Care (Signed)
Immediate Anesthesia Transfer of Care Note  Patient: Manuel Jimenez  Procedure(s) Performed: ENTEROSCOPY (N/A )  Patient Location: PACU  Anesthesia Type:MAC  Level of Consciousness: awake  Airway & Oxygen Therapy: Patient Spontanous Breathing and Patient connected to nasal cannula oxygen  Post-op Assessment: Report given to RN and Post -op Vital signs reviewed and stable  Post vital signs: Reviewed and stable  Last Vitals:  Vitals Value Taken Time  BP    Temp    Pulse 73 04/21/2018 12:23 PM  Resp    SpO2 95 % 04/21/2018 12:23 PM  Vitals shown include unvalidated device data.  Last Pain:  Vitals:   04/21/18 1155  TempSrc: Tympanic  PainSc: 0-No pain         Complications: No apparent anesthesia complications

## 2018-04-21 NOTE — Op Note (Signed)
[facility] Gastroenterology Patient Name: Manuel Jimenez Procedure Date: 04/21/2018 11:51 AM MRN: [id number] Account #: [id number] Date of Birth: [birth year] Admit Type: Inpatient Age: 57 Room: [facility] ENDO ROOM 4 Gender: Male Note Status: Finalized Procedure:            Upper GI endoscopy Indications:          Iron deficiency anemia secondary to chronic blood loss,                        Melena Providers:            Lin Landsman MD, MD Referring MD:         [name] (Referring MD) Medicines:            Monitored Anesthesia Care Complications:        No immediate complications. Estimated blood loss:                        Minimal. Procedure:            Pre-Anesthesia Assessment:                       - Prior to the procedure, a History and Physical was                        performed, and patient medications and allergies were                        reviewed. The patient is competent. The risks and                        benefits of the procedure and the sedation options and                        risks were discussed with the patient. All questions                        were answered and informed consent was obtained.                        Patient identification and proposed procedure were                        verified by the physician, the nurse, the                        anesthesiologist, the anesthetist and the technician in                        the pre-procedure area in the procedure room in the                        endoscopy suite. Mental Status Examination: alert and                        oriented. Airway Examination: normal oropharyngeal                        airway and neck mobility. Respiratory Examination:  clear to auscultation. CV Examination: normal.                        Prophylactic Antibiotics: The patient does not require                        prophylactic antibiotics. Prior Anticoagulants:  The                        patient has taken no previous anticoagulant or                        antiplatelet agents. ASA Grade Assessment: III - A                        patient with severe systemic disease. After reviewing                        the risks and benefits, the patient was deemed in                        satisfactory condition to undergo the procedure. The                        anesthesia plan was to use monitored anesthesia care                        (MAC). Immediately prior to administration of                        medications, the patient was re-assessed for adequacy                        to receive sedatives. The heart rate, respiratory rate,                        oxygen saturations, blood pressure, adequacy of                        pulmonary ventilation, and response to care were                        monitored throughout the procedure. The physical status                        of the patient was re-assessed after the procedure.                       After obtaining informed consent, the endoscope was                        passed under direct vision. Throughout the procedure,                        the patient's blood pressure, pulse, and oxygen                        saturations were monitored continuously. The Endoscope  was introduced through the mouth, and advanced to the                        second part of duodenum. The upper GI endoscopy was                        accomplished without difficulty. The patient tolerated                        the procedure fairly well. Findings:      The duodenal bulb and second portion of the duodenum were normal.       Biopsies were taken with a cold forceps for histology.      Mild, diffuse portal hypertensive gastropathy was found in the entire       examined stomach. Biopsies were taken with a cold forceps for       Helicobacter pylori testing.      Few non-bleeding superficial gastric ulcers  with a clean ulcer base       (Forrest Class III) were found in the gastric antrum. The largest lesion       was 5 mm in largest dimension.      Esophagogastric landmarks were identified: the gastroesophageal junction       was found at 39 cm from the incisors.      Two columns of non-bleeding large (> 5 mm) varices were found in the       lower third of the esophagus,. Stigmata of recent bleeding were evident       and red wale signs were present. One band was successfully placed with       complete eradication, resulting in deflation of varices. There was no       bleeding during, and at the end, of the procedure.      One non-bleeding superficial duodenal ulcer with a clean ulcer base       (Forrest Class III) was found in the duodenal bulb. The lesion was 5 mm       in largest dimension. Impression:           - Normal duodenal bulb and second portion of the                        duodenum. Biopsied.                       - Portal hypertensive gastropathy. Biopsied.                       - Non-bleeding gastric ulcers with a clean ulcer base                        (Forrest Class III).                       - Esophagogastric landmarks identified.                       - Recently bleeding large (> 5 mm) esophageal varices.                        Completely eradicated. Banded. Recommendation:       - Return patient to hospital ward for ongoing care.                       -  Clear liquid diet today.                       - Continue present medications.                       - Continue octreotide and protonix drips                       - Ceftriaxone daily for SBP prophylaxis                       - Await pathology results.                       - Return to GI clinic in 3 weeks after discharge. Procedure Code(s):    --- Professional ---                       [phone], Esophagogastroduodenoscopy, flexible, transoral;                        with band ligation of esophageal/gastric varices                        43239, Esophagogastroduodenoscopy, flexible, transoral;                        with biopsy, single or multiple Diagnosis Code(s):    --- Professional ---                       K76.6, Portal hypertension                       K31.89, Other diseases of stomach and duodenum                       K25.9, Gastric ulcer, unspecified as acute or chronic,                        without hemorrhage or perforation                       I85.01, Esophageal varices with bleeding                       D50.0, Iron deficiency anemia secondary to blood loss                        (chronic)                       K92.1, Melena (includes Hematochezia) CPT copyright 2017 American Medical Association. All rights reserved. The codes documented in this report are preliminary and upon coder review may  be revised to meet current compliance requirements. Dr. Ulyess Mort Lin Landsman MD, MD 04/21/2018 12:21:02 PM This report has been signed electronically. Number of Addenda: 0 Note Initiated On: 04/21/2018 11:51 AM Estimated Blood Loss: Estimated blood loss was minimal.      [facility]

## 2018-04-21 NOTE — Progress Notes (Signed)
Advanced care plan.  Purpose of the Encounter: CODE STATUS  Parties in Attendance: Patient and family  Patient's Decision Capacity: Good  Subjective/Patient's story: Patient presented for large volume melena Has GI bleeding  Objective/Medical story Needs GI evaluation with EGD for source of bleed Needs IV parenteral iron therapy Serial Hb and Hct monitoring  Goals of care determination:  Advance care directives and goals of care discussed with patient and family Patient wants everything done which includes cpr , intubation and ventilator if need arises  CODE STATUS: Full code   Time spent discussing advanced care planning: 16 minutes

## 2018-04-21 NOTE — Anesthesia Preprocedure Evaluation (Addendum)
Anesthesia Evaluation  Patient identified by MRN, date of birth, ID band Patient awake    Reviewed: Allergy & Precautions, H&P , NPO status , Patient's Chart, lab work & pertinent test results, reviewed documented beta blocker date and time   Airway Mallampati: II  TM Distance: >3 FB Neck ROM: full    Dental no notable dental hx. (+) Missing   Pulmonary Current Smoker,  80 pk yr smoking hx   Pulmonary exam normal breath sounds clear to auscultation       Cardiovascular Exercise Tolerance: Good hypertension, Normal cardiovascular exam+ Valvular Problems/Murmurs (murmur)  Rhythm:regular Rate:Normal     Neuro/Psych negative neurological ROS  negative psych ROS   GI/Hepatic PUD, GERD  Controlled,(+) Cirrhosis   ascites    , Hx portal vein thrombosis 1245 Alcoholic cirrhosis   Endo/Other  negative endocrine ROS  Renal/GU Hx hypokalemia, K+ 3.8   Melena, possible GI bleed    Musculoskeletal   Abdominal   Peds  Hematology  (+) anemia , Hgb 7.5 after receiving 2 units pRBC overnight Platelets 75K   Anesthesia Other Findings   Reproductive/Obstetrics negative OB ROS                          Anesthesia Physical  Anesthesia Plan  ASA: III  Anesthesia Plan: General   Post-op Pain Management:    Induction:   PONV Risk Score and Plan: Propofol infusion and TIVA  Airway Management Planned: Natural Airway and Nasal Cannula  Additional Equipment:   Intra-op Plan:   Post-operative Plan:   Informed Consent: I have reviewed the patients History and Physical, chart, labs and discussed the procedure including the risks, benefits and alternatives for the proposed anesthesia with the patient or authorized representative who has indicated his/her understanding and acceptance.   Dental Advisory Given  Plan Discussed with: CRNA and Anesthesiologist  Anesthesia Plan Comments:          Anesthesia Quick Evaluation

## 2018-04-21 NOTE — Progress Notes (Signed)
Report called to Lauren RN.

## 2018-04-21 NOTE — Consult Note (Addendum)
Cephas Darby, MD [address]  [place]  [place], [place] [address]  Main: [phone]  Fax: [phone] Pager: [phone]   Consultation  Referring Provider:     No ref. provider found Primary Care Physician:  [name], DO Primary Gastroenterologist:  [name]         Reason for Consultation:     Melena, severe symptomatic anemia  Date of Admission:  04/20/2018 Date of Consultation:  04/21/2018         HPI:   Manuel Jimenez is a 57 y.o. male with history of alcoholic cirrhosis, compensated presents with 2 days history of black tarry stools and severe symptomatic anemia.his hemoglobin on admission was 6.5. Patient received 2 units of PRBCs, hemoglobin is 7.4 today. He does have chronic thrombocytopenia. Hemoglobin prior to admission was 12.6. Last normal was 15 in 01/2017. Patient has history of chronic iron deficiency anemia and based on the prior endoscopic evaluation which was EGD and colonoscopy, he was found to have moderate portal hypertensive gastropathy and adenomas in the colon. He has been seeing Dr. [name] receiving parenteral iron therapy. His ferritin levels have been chronically low since 04/2016 Other than generalized weakness, patient denies chest pain, abdominal pain, ascites, swelling of legs, hematemesis, f/c. His last drink of ETOH was in 04/2015  He is kept NPO, started on PPI drip  NSAIDs: None  Antiplts/Anticoagulants/Anti thrombotics: None  GI Procedures:   EGD 02/02/2017 for iron deficiency anemia - Grade I esophageal varices. - moderate Portal hypertensive gastropathy. - Normal examined duodenum. - No specimens collected.  Colonoscopy 02/02/2017 - One 3 mm polyp in the descending colon, removed with a cold biopsy forceps. Resected and retrieved. Clip (MR conditional) was placed. - One 10 mm polyp in the sigmoid colon, removed with a hot snare. Resected and retrieved. Clip (MR conditional) was placed. - Four 5 to 6 mm  polyps in the sigmoid colon, removed with a cold snare. Resected and retrieved. - Diverticulosis in the sigmoid colon. - Non-bleeding internal hemorrhoids.   EGD 05/14/2015 - Benign-appearing esophageal stricture. Dilated. - The examination was otherwise normal. - Portal hypertensive gastropathy. - The examination was otherwise normal. - Normal examined duodenum. - No specimens collected.  Colonoscopy 06/17/2015 - Diverticulosis in the ascending colon. - One small polyp in the ascending colon. Resected and retrieved. - One small polyp in the descending colon. Resected and retrieved. - One large polyp in the sigmoid colon. Resected and retrieved. - The examination was otherwise normal. - The examination was otherwise normal. - The examined portion of the ileum was normal. DIAGNOSIS:  A. COLON POLYP, ASCENDING; BIOPSY:  - TUBULAR ADENOMA, 2 FRAGMENTS.  - NEGATIVE FOR HIGH-GRADE DYSPLASIA AND MALIGNANCY.   B. COLON POLYP, DESCENDING; BIOPSY:  - COLONIC MUCOSA WITHIN NORMAL LIMITS.   C. COLON POLYP, SIGMOID; BIOPSY:  - TUBULOVILLOUS ADENOMA, FRAGMENTS.  - NEGATIVE FOR HIGH-GRADE DYSPLASIA AND MALIGNANCY.     Past Medical History:  Diagnosis Date  . Anemia   . Cancer ([facility])   . Cirrhosis ([facility])   . Colon polyps   . Constipation   . Diarrhea   . Hemorrhoids   . Hypertension    in past  . Portal vein thrombosis    see chart review 06/05/15  . Shingles 09/2016  . Stomach ulcer   . Weight loss     Past Surgical History:  Procedure Laterality Date  . COLONOSCOPY N/A 06/17/2015   Procedure: COLONOSCOPY;  Surgeon:  Hulen Luster, MD;  Location: [facility];  Service: Gastroenterology;  Laterality: N/A;  . COLONOSCOPY WITH PROPOFOL N/A 02/02/2017   Procedure: COLONOSCOPY WITH PROPOFOL;  Surgeon: [name], MD;  Location: [facility];  Service: Endoscopy;  Laterality: N/A;  . ESOPHAGOGASTRODUODENOSCOPY N/A 05/14/2015   Procedure: ESOPHAGOGASTRODUODENOSCOPY  (EGD);  Surgeon: Hulen Luster, MD;  Location: [facility] ENDOSCOPY;  Service: Endoscopy;  Laterality: N/A;  . ESOPHAGOGASTRODUODENOSCOPY (EGD) WITH PROPOFOL N/A 02/02/2017   Procedure: ESOPHAGOGASTRODUODENOSCOPY (EGD) WITH PROPOFOL;  Surgeon: [name], MD;  Location: [facility];  Service: Endoscopy;  Laterality: N/A;  . POLYPECTOMY  02/02/2017   Procedure: POLYPECTOMY;  Surgeon: [name], MD;  Location: [facility];  Service: Endoscopy;;    Prior to Admission medications   Medication Sig Start Date End Date Taking? Authorizing Provider  famotidine (PEPCID) 20 MG tablet Take 1 tablet (20 mg total) by mouth 2 (two) times daily. Patient taking differently: Take 20 mg by mouth 2 (two) times daily as needed for heartburn or indigestion.  02/02/17 04/20/18 Yes Karamalegos, [name], DO  iron polysaccharides (NIFEREX) 150 MG capsule Take 1 capsule (150 mg total) by mouth daily. 01/19/18  Yes Verlon Au, NP  loratadine (CLARITIN) 10 MG tablet Take 10 mg by mouth daily.   Yes [provider]    Current Facility-Administered Medications:  .  cefTRIAXone (ROCEPHIN) 1 g in sodium chloride 0.9 % 100 mL IVPB, 1 g, Intravenous, Q24H, Vanga, Tally Due, MD, Last Rate: 200 mL/hr at 04/21/18 0806, 1 g at 04/21/18 0806 .  Influenza vac split quadrivalent PF (FLUARIX) injection 0.5 mL, 0.5 mL, Intramuscular, Tomorrow-1000, Lance Coon, MD .  ondansetron [facility]) tablet 4 mg, 4 mg, Oral, Q6H PRN **OR** ondansetron (ZOFRAN) injection 4 mg, 4 mg, Intravenous, Q6H PRN, Lance Coon, MD .  pantoprazole (PROTONIX) 80 mg in sodium chloride 0.9 % 250 mL (0.32 mg/mL) infusion, 8 mg/hr, Intravenous, Continuous, Lance Coon, MD, Last Rate: 25 mL/hr at 04/21/18 0724, 8 mg/hr at 04/21/18 0724 .  [START ON 04/24/2018] pantoprazole (PROTONIX) injection 40 mg, 40 mg, Intravenous, Q12H, Lance Coon, MD .  pneumococcal 23 valent vaccine (PNU-IMMUNE) injection 0.5 mL, 0.5 mL, Intramuscular,  Tomorrow-1000, Lance Coon, MD   Family History  Problem Relation Age of Onset  . CAD Father   . Heart attack Father      Social History   Tobacco Use  . Smoking status: Current Every Day Smoker    Packs/day: 2.00    Years: 40.00    Pack years: 80.00    Types: Cigarettes  . Smokeless tobacco: Never Used  . Tobacco comment: Only successfully quit for 1 month before Jimenez to acute illness. (01/24/17 - down to 1.5 PPD)  Substance Use Topics  . 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  . Drug use: No    Allergies as of 04/20/2018 - Review Complete 04/20/2018  Allergen Reaction Noted  . Lorazepam Other (See Comments) 04/06/2016  . Iron Nausea Only 01/24/2017    Review of Systems:    All systems reviewed and negative except where noted in HPI.   Physical Exam:  Vital signs in last 24 hours: Temp:  [97.4 F (36.3 C)-98.7 F (37.1 C)] 97.4 F (36.3 C) (09/21 0438) Pulse Rate:  [65-91] 65 (09/21 0438) Resp:  [12-19] 18 (09/21 0438) BP: (98-152)/(58-68) 98/63 (09/21 0438) SpO2:  [98 %-100 %] 98 % (09/21 0438) Weight:  [63.5 kg] 63.5 kg (  09/20 1747) Last BM Date: 04/19/18 General:   Pleasant, cooperative in NAD, thin built Head:  Normocephalic and atraumatic. Eyes:   No icterus.   Conjunctiva pale. PERRLA. Ears:  Normal auditory acuity. Neck:  Supple; no masses or thyroidomegaly Lungs: Respirations even and unlabored. Lungs clear to auscultation bilaterally.   No wheezes, crackles, or rhonchi.  Heart:  Regular rate and rhythm;  Without murmur, clicks, rubs or gallops Abdomen:  Soft, nondistended, nontender. Normal bowel sounds. No appreciable masses or hepatomegaly.  No rebound or guarding.  Rectal:  Not performed. Msk:  Symmetrical without gross deformities.  Extremities:  Without edema, cyanosis or clubbing. Neurologic:  Alert and oriented x3;  grossly normal neurologically. Skin:  Intact without significant lesions or rashes. Cervical Nodes:  No  significant cervical adenopathy. Psych:  Alert and cooperative. Normal affect.  LAB RESULTS: CBC Latest Ref Rng & Units 04/21/2018 04/20/2018 04/20/2018  WBC 3.8 - 10.6 K/uL 4.2 6.3 6.5  Hemoglobin 13.0 - 18.0 g/dL 7.4(L) 6.4(L) 6.5(L)  Hematocrit 40.0 - 52.0 % 22.1(L) 20.5(L) 20.8(L)  Platelets 150 - 440 K/uL 75(L) 105(L) 107(L)    BMET BMP Latest Ref Rng & Units 04/21/2018 04/20/2018 01/19/2017  Glucose 70 - 99 mg/dL 97 109(H) 92  BUN 6 - 20 mg/dL 38(H) 55(H) 8  Creatinine 0.61 - 1.24 mg/dL 0.63 0.74 0.64  Sodium 135 - 145 mmol/L 143 140 141  Potassium 3.5 - 5.1 mmol/L 3.8 4.2 3.1(L)  Chloride 98 - 111 mmol/L 114(H) 112(H) 105  CO2 22 - 32 mmol/L 22 20(L) 26  Calcium 8.9 - 10.3 mg/dL 8.2(L) 9.4 9.3    LFT Hepatic Function Latest Ref Rng & Units 04/20/2018 01/19/2017 04/18/2016  Total Protein 6.5 - 8.1 g/dL 5.9(L) 7.2 6.0(L)  Albumin 3.5 - 5.0 g/dL 3.6 4.4 3.8  AST 15 - 41 U/L _0 ALT 0 - 44 U/L _1 Alk Phosphatase 38 - 126 U/L 65 101 111  Total Bilirubin 0.3 - 1.2 mg/dL 0.3 0.8 0.4     STUDIES: No results found.    Impression / Plan:   Manuel Jimenez is a 57 y.o. White male with alcohol cirrhosis, compensated, chronic iron deficiency anemia of unclear etiology, known esophageal varices, nonbleeding, portal hypertensive gastropathy admitted with severe symptomatic anemia and 2 days of melena  Melena: variceal bleed less likely or peptic ulcer disease or bleeding from AVMs or proximal colonic bleed Patient received 2 units of PRBCs and did not respond appropriately He has very low ferritin Recommend parenteral iron therapy and continue as outpatient  Continue pantoprazole drip Nothing by mouth Monitor hemoglobin every 6-8 hours and transfuse to keep hemoglobin above 7 EGD today  Chronic iron deficiency anemia: Patient underwent EGD and colonoscopy in the past with no clear source of anemia identified Recommend EGD with push enteroscopy and +/- VCE Continue  parenteral iron therapy  Alcoholic cirrhosis: compensated, child A Currently euvolemic Evidence of portal hypertension by Thombocytopenia, portal hypertensive gastropathy and varices HCC surveillance: Check AFP and right upper quadrant ultrasound with Dopplers, patient has history of nonocclusive portal vein thrombosis   Thank you for involving me in the care of this patient. Will follow along with you     LOS: 1 day   Sherri Sear, MD  04/21/2018, 10:21 AM   Note: This dictation was prepared with Dragon dictation along with smaller phrase technology. Any transcriptional errors that result from this process are unintentional.

## 2018-04-21 NOTE — Anesthesia Post-op Follow-up Note (Signed)
Anesthesia QCDR form completed.        

## 2018-04-21 NOTE — Progress Notes (Signed)
MEDICATION RELATED CONSULT NOTE - FOLLOW UP   Pharmacy Consult for parenteral iron therapy Indication: GI bleed  Allergies  Allergen Reactions  . Lorazepam Other (See Comments)    "made me anxious and mean"   . Iron Nausea Only    Patient Measurements: Height: 5\' 11"  (180.3 cm) Weight: 139 lb 15.9 oz (63.5 kg) IBW/kg (Calculated) : 75.3  Vital Signs: Temp: 98 F (36.7 C) (09/21 1224) Temp Source: Tympanic (09/21 1155) BP: 113/53 (09/21 1230) Pulse Rate: 85 (09/21 1230) Intake/Output from previous day: 09/20 0701 - 09/21 0700 In: 1018 [I.V.:38; Blood:980] Out: -  Intake/Output from this shift: Total I/O In: 1754.2 [I.V.:1654.2; IV Piggyback:100] Out: -   Labs: Recent Labs    04/20/18 1533 04/20/18 1749 04/21/18 0301  WBC 6.5 6.3 4.2  HGB 6.5* 6.4* 7.4*  HCT 20.8* 20.5* 22.1*  PLT 107* 105* 75*  CREATININE  --  0.74 0.63  ALBUMIN  --  3.6  --   PROT  --  5.9*  --   AST  --  19  --   ALT  --  17  --   ALKPHOS  --  65  --   BILITOT  --  0.3  --    Estimated Creatinine Clearance: 90.4 mL/min (by C-G formula based on SCr of 0.63 mg/dL).   Medications:  Scheduled:  . octreotide  50 mcg Intravenous Once  . [MAR Hold] pantoprazole  40 mg Intravenous Q12H    Assessment: 57 y.o. male with history of alcoholic cirrhosis, compensated, nonbleeding esophageal varices, portal hypertension presents with 2 days history of black tarry stools and severe symptomatic anemia.his hemoglobin on admission was 6.5. Patient received 2 units of PRBCs, hemoglobin is 7.4 today. He has chronic thrombocytopenia. Hemoglobin PTA was 12.6. His ferritin levels have been chronically low since 04/2016 and today is 7 ng/mL, Fe: 20 mcg/mL, Tsat 7%. GI has recommended parenteral iron therapy.   Goal of Therapy:  To keep hemoglobin greater than 7 g/dL  Plan:  Given patient's severely low saturation and ferritin will give Iron sucrose 300mg , potentially increasing hemoglobin to 9 g/dL  Dallie Piles, PharmD 04/21/2018,12:33 PM

## 2018-04-21 NOTE — Progress Notes (Signed)
[facility] [place] NAME: Manuel Jimenez    MR#:  [id number]  DATE OF BIRTH:  09-Sep-1960  SUBJECTIVE:  CHIEF COMPLAINT:   Chief Complaint  Patient presents with  . GI Bleeding  . Abnormal Lab  Patient seen and evaluated today Tolerated PRBC transfusion well yesterday No vomiting of blood No abdominal pain Due for EGD today  REVIEW OF SYSTEMS:    ROS  CONSTITUTIONAL: No documented fever. Has fatigue, weakness. No weight gain, no weight loss.  EYES: No blurry or double vision.  ENT: No tinnitus. No postnasal drip. No redness of the oropharynx.  RESPIRATORY: No cough, no wheeze, no hemoptysis. No dyspnea.  CARDIOVASCULAR: No chest pain. No orthopnea. No palpitations. No syncope.  GASTROINTESTINAL: No nausea, no vomiting or diarrhea. No abdominal pain. No melena or hematochezia.  GENITOURINARY: No dysuria or hematuria.  ENDOCRINE: No polyuria or nocturia. No heat or cold intolerance.  HEMATOLOGY: Has anemia. No bruising. Had melena INTEGUMENTARY: No rashes. No lesions.  MUSCULOSKELETAL: No arthritis. No swelling. No gout.  NEUROLOGIC: No numbness, tingling, or ataxia. No seizure-type activity.  PSYCHIATRIC: No anxiety. No insomnia. No ADD.   DRUG ALLERGIES:   Allergies  Allergen Reactions  . Lorazepam Other (See Comments)    "made me anxious and mean"   . Iron Nausea Only    VITALS:  Blood pressure 98/63, pulse 65, temperature (!) 97.4 F (36.3 C), temperature source Oral, resp. rate 18, height 5\' 11"  (1.803 m), weight 63.5 kg, SpO2 98 %.  PHYSICAL EXAMINATION:   Physical Exam  GENERAL:  57 y.o.-year-old patient lying in the bed with no acute distress.  EYES: Pupils equal, round, reactive to light and accommodation. No scleral icterus. Extraocular muscles intact. Pallor present. HEENT: Head atraumatic, normocephalic. Oropharynx and nasopharynx clear.  NECK:  Supple, no jugular venous distention. No thyroid enlargement, no  tenderness.  LUNGS: Normal breath sounds bilaterally, no wheezing, rales, rhonchi. No use of accessory muscles of respiration.  CARDIOVASCULAR: S1, S2 normal. No murmurs, rubs, or gallops.  ABDOMEN: Soft, nontender, nondistended. Bowel sounds present. No organomegaly or mass.  EXTREMITIES: No cyanosis, clubbing or edema b/l.    NEUROLOGIC: Cranial nerves II through XII are intact. No focal Motor or sensory deficits b/l.   PSYCHIATRIC: The patient is alert and oriented x 3.  SKIN: No obvious rash, lesion, or ulcer.   LABORATORY PANEL:   CBC Recent Labs  Lab 04/21/18 0301  WBC 4.2  HGB 7.4*  HCT 22.1*  PLT 75*   ------------------------------------------------------------------------------------------------------------------ Chemistries  Recent Labs  Lab 04/20/18 1749 04/21/18 0301  NA 140 143  K 4.2 3.8  CL 112* 114*  CO2 20* 22  GLUCOSE 109* 97  BUN 55* 38*  CREATININE 0.74 0.63  CALCIUM 9.4 8.2*  AST 19  --   ALT 17  --   ALKPHOS 65  --   BILITOT 0.3  --    ------------------------------------------------------------------------------------------------------------------  Cardiac Enzymes No results for input(s): TROPONINI in the last 168 hours. ------------------------------------------------------------------------------------------------------------------  RADIOLOGY:  No results found.   ASSESSMENT AND PLAN:   57 year old male patient with history of chronic iron deficiency, alcoholic cirrhosis, hypertension, portal vein thrombosis, stomach ulcer currently under hospitalist service for gastrointestinal bleeding.  -Acute gastrointestinal bleeding Variceal bleed versus peptic ulcer disease versus bleeding AVMs Status post PRBC transfusion Continue IV Protonix drip N.p.o. 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 Management plans discussed with the patient, family and they are in agreement.  CODE STATUS: Full code  DVT Prophylaxis: SCDs  TOTAL TIME TAKING CARE OF THIS PATIENT: 35 minutes.   POSSIBLE D/C IN 2 to 3 DAYS, DEPENDING ON CLINICAL CONDITION.  Saundra Shelling M.D on 04/21/2018 at 11:38 AM  Between 7am to 6pm - Pager - [phone]  After 6pm go to [email or web address] - password [email or web address] [facility]  Office  [phone]  CC: Primary care physician; [name], DO  Note: This dictation was prepared with Dragon dictation along with smaller phrase technology. Any transcriptional errors that result from this process are unintentional.

## 2018-04-22 ENCOUNTER — Encounter: Payer: Self-pay | Admitting: Gastroenterology

## 2018-04-22 LAB — HEMOGLOBIN AND HEMATOCRIT, BLOOD
HCT: 22.2 % — ABNORMAL LOW (ref 40.0–52.0)
HEMOGLOBIN: 7.3 g/dL — AB (ref 13.0–18.0)

## 2018-04-22 MED ORDER — PANTOPRAZOLE SODIUM 40 MG PO TBEC
40.0000 mg | DELAYED_RELEASE_TABLET | Freq: Two times a day (BID) | ORAL | Status: DC
  Administered 2018-04-22: 40 mg via ORAL
  Filled 2018-04-22: qty 1

## 2018-04-22 MED ORDER — SODIUM CHLORIDE 0.9 % IV SOLN
INTRAVENOUS | Status: DC | PRN
  Administered 2018-04-22: 250 mL via INTRAVENOUS

## 2018-04-22 MED ORDER — SODIUM CHLORIDE 0.9 % IV SOLN
300.0000 mg | Freq: Once | INTRAVENOUS | Status: AC
  Administered 2018-04-22: 300 mg via INTRAVENOUS
  Filled 2018-04-22: qty 15

## 2018-04-22 MED ORDER — PANTOPRAZOLE SODIUM 40 MG PO TBEC: 40.0000 mg | DELAYED_RELEASE_TABLET | Freq: Two times a day (BID) | ORAL | 0 refills | Status: DC

## 2018-04-22 NOTE — Discharge Summary (Signed)
[facility] NAME: Manuel Jimenez    MR#:  [id number]  DATE OF BIRTH:  01/01/1961  DATE OF ADMISSION:  04/20/2018 ADMITTING PHYSICIAN: Sela Hua, MD  DATE OF DISCHARGE: 04/22/2018  1:03 PM  PRIMARY CARE PHYSICIAN: [name], DO   ADMISSION DIAGNOSIS:  Symptomatic anemia [D64.9] Gastrointestinal hemorrhage, unspecified gastrointestinal hemorrhage type [K92.2]  DISCHARGE DIAGNOSIS:  Principal Problem:   GI bleeding Active Problems:   Alcoholic cirrhosis of liver without ascites (HCC)   GERD (gastroesophageal reflux disease)   Thrombocytopenia (HCC) Gastric ulcers Portal hypertensive gastropathy Esophageal varices banded  SECONDARY DIAGNOSIS:   Past Medical History:  Diagnosis Date  . Anemia   . Cancer ([place])   . Cirrhosis ([place])   . Colon polyps   . Constipation   . Diarrhea   . Hemorrhoids   . Hypertension    in past  . Portal vein thrombosis    see chart review 06/05/15  . Shingles 09/2016  . Stomach ulcer   . Weight loss      ADMITTING HISTORY  Manuel Jimenez  is a 57 y.o. male who presents with chief complaint as above.  Patient presents to the ED after an episode of large-volume melena.  He states that he has had some increased fatigue over the last couple of days.  Here in the ED he was found to have a hemoglobin of 6.4, this is down from normal values in the past on chart review.  He does have a history of having a GI bleed before, and was told he had diverticulitis at some point in the past, and at another point that he had a peptic ulcer.  He was started on IV pantoprazole, ordered for 2 units of PRBC transfusion, and hospitalist were called for admission HOSPITAL COURSE:  Patient was admitted to medical floor.  He tolerated PRBC transfusions well.  Started on IV Protonix and IV octreotide drips.  Patient underwent upper endoscopy by gastroenterology Dr. Mariann Laster.  Patient tolerated procedure well.   Nonbleeding gastric ulcers were noted, esophageal varices noted and  banded.  Post transfusion hemoglobin hematocrit has been stable.  Diet was advanced and patient tolerated diet well.  Patient received IV iron therapy during the stay in the hospitalization.  He will be discharged home on iron supplements and proton pump inhibitor orally twice daily.  Follow-up with gastroenterology in the clinic.  CONSULTS OBTAINED:  Treatment Team:  Lin Landsman, MD  DRUG ALLERGIES:   Allergies  Allergen Reactions  . Lorazepam Other (See Comments)    "made me anxious and mean"   . Iron Nausea Only    DISCHARGE MEDICATIONS:   Allergies as of 04/22/2018      Reactions   Lorazepam Other (See Comments)   "made me anxious and mean"   Iron Nausea Only      Medication List    STOP taking these medications   famotidine 20 MG tablet Commonly known as:  PEPCID     TAKE these medications   iron polysaccharides 150 MG capsule Commonly known as:  NIFEREX Take 1 capsule (150 mg total) by mouth daily.   loratadine 10 MG tablet Commonly known as:  CLARITIN Take 10 mg by mouth daily.   pantoprazole 40 MG tablet Commonly known as:  PROTONIX Take 1 tablet (40 mg total) by mouth 2 (two) times daily.       Today  Patient seen and evaluated today No new  episodes of melanotic stools No vomiting of blood Tolerated diet well  VITAL SIGNS:  Blood pressure 128/60, pulse 67, temperature 98.5 F (36.9 C), temperature source Oral, resp. rate 16, height 5\' 11"  (1.803 m), weight 63.5 kg, SpO2 96 %.  I/O:    Intake/Output Summary (Last 24 hours) at 04/22/2018 1349 Last data filed at 04/22/2018 1121 Gross per 24 hour  Intake 1507.3 ml  Output 0 ml  Net 1507.3 ml    PHYSICAL EXAMINATION:  Physical Exam  GENERAL:  57 y.o.-year-old patient lying in the bed with no acute distress.  LUNGS: Normal breath sounds bilaterally, no wheezing, rales,rhonchi or crepitation. No use of accessory  muscles of respiration.  CARDIOVASCULAR: S1, S2 normal. No murmurs, rubs, or gallops.  ABDOMEN: Soft, non-tender, non-distended. Bowel sounds present. No organomegaly or mass.  NEUROLOGIC: Moves all 4 extremities. PSYCHIATRIC: The patient is alert and oriented x 3.  SKIN: No obvious rash, lesion, or ulcer.   DATA REVIEW:   CBC Recent Labs  Lab 04/21/18 0301  04/22/18 0422  WBC 4.2  --   --   HGB 7.4*   < > 7.3*  HCT 22.1*   < > 22.2*  PLT 75*  --   --    < > = values in this interval not displayed.    Chemistries  Recent Labs  Lab 04/20/18 1749 04/21/18 0301  NA 140 143  K 4.2 3.8  CL 112* 114*  CO2 20* 22  GLUCOSE 109* 97  BUN 55* 38*  CREATININE 0.74 0.63  CALCIUM 9.4 8.2*  AST 19  --   ALT 17  --   ALKPHOS 65  --   BILITOT 0.3  --     Cardiac Enzymes No results for input(s): TROPONINI in the last 168 hours.  Microbiology Results  Results for orders placed or performed during the hospital encounter of 04/20/18  MRSA PCR Screening     Status: None   Collection Time: 04/20/18 10:49 PM  Result Value Ref Range Status   MRSA by PCR NEGATIVE NEGATIVE Final    Comment:        The GeneXpert MRSA Assay (FDA approved for NASAL specimens only), is one component of a comprehensive MRSA colonization surveillance program. It is not intended to diagnose MRSA infection nor to guide or monitor treatment for MRSA infections. Performed at [facility], [address], [place], [place] [address]     RADIOLOGY:  US Liver Doppler  Result Date: 04/21/2018 CLINICAL DATA:  History of cirrhosis. History of nonocclusive portal vein thrombus in the main portal vein extending into the intrahepatic right portal vein. EXAM: DUPLEX ULTRASOUND OF LIVER TECHNIQUE: Color and duplex Doppler ultrasound was performed to evaluate the hepatic in-flow and out-flow vessels. COMPARISON:  Right upper quadrant ultrasound on 01/25/2017 and MRI of the abdomen on 06/11/2015. FINDINGS:  Portal Vein Velocities Main:  24-30 cm/sec Right:  7 cm/sec Left:  8 cm/sec Hepatic Vein Velocities Right:  8 cm/sec Middle:  17 cm/sec Left:  41 cm/sec Hepatic Artery Velocity:  124 cm/sec Splenic Vein Velocity:  30 cm/sec Varices: No varices identified by ultrasound. Ascites: No ascites identified by ultrasound. There remains nonocclusive thrombus in the distal portion of the main portal vein near the porta hepatis. No thrombus is identified in intrahepatic segments of the portal veins. Overall amount of thrombus appears less prominent compared to the ultrasound on 01/25/2017. The vein also appears less distended at the level of thrombus measuring 1.9 cm in  greatest diameter compared to 2.5 cm on the prior study. Flow direction in the portal veins is towards the liver. No evidence of splenic vein thrombosis. The spleen is significantly enlarged with estimated volume of 1111 mL. IMPRESSION: Persistent nonocclusive thrombus in the distal portion of the main portal vein. Amount of thrombus and degree of distal portal vein distension appears less prominent compared to the prior ultrasound in June. Electronically Signed   By: Aletta Edouard M.D.   On: 04/21/2018 11:45    Follow up with PCP in 1 week.  Management plans discussed with the patient, family and they are in agreement.  CODE STATUS: Full code    Code Status Orders  (From admission, onward)         Start     Ordered   04/20/18 2242  Full code  Continuous     04/20/18 2241        Code Status History    Date Active Date Inactive Code Status Order ID Comments User Context   05/06/2015 2219 05/14/2015 1942 Full Code [id number]  Hower, Aaron Mose, MD ED      TOTAL TIME TAKING CARE OF THIS PATIENT ON DAY OF DISCHARGE: more than 33 minutes.   Saundra Shelling M.D on 04/22/2018 at 1:49 PM  Between 7am to 6pm - Pager - [phone]  After 6pm go to [email or web address] - password [email or web address] [facility]  Office   [phone]  CC: Primary care physician; [name], DO  Note: This dictation was prepared with Dragon dictation along with smaller phrase technology. Any transcriptional errors that result from this process are unintentional.

## 2018-04-23 ENCOUNTER — Other Ambulatory Visit: Payer: Self-pay | Admitting: Nurse Practitioner

## 2018-04-23 ENCOUNTER — Telehealth: Payer: Self-pay | Admitting: Gastroenterology

## 2018-04-23 ENCOUNTER — Encounter: Payer: Self-pay | Admitting: Gastroenterology

## 2018-04-23 LAB — AFP TUMOR MARKER
AFP, Serum, Tumor Marker: 1.4 ng/mL (ref 0.0–8.3)
AFP, Serum, Tumor Marker: 1.3 ng/mL (ref 0.0–8.3)

## 2018-04-23 NOTE — Telephone Encounter (Signed)
-----   Message from Lin Landsman, MD sent at 04/22/2018  5:07 PM EDT ----- Regarding: iron deficiency anemia, hospital follow-up I would like to see him in the office this week. Okay to [employer]

## 2018-04-23 NOTE — Telephone Encounter (Signed)
LEFT VM FOR PT TO CALL OFFICE AND SCHEDULE ED FU THIS WEEK TO SEE DR. VANGA

## 2018-04-24 LAB — HIV ANTIBODY (ROUTINE TESTING W REFLEX): HIV Screen 4th Generation wRfx: NONREACTIVE

## 2018-04-24 NOTE — Anesthesia Postprocedure Evaluation (Signed)
Anesthesia Post Note  Patient: Manuel Jimenez  Procedure(s) Performed: ENTEROSCOPY (N/A )  Patient location during evaluation: PACU Anesthesia Type: General Level of consciousness: awake and alert Pain management: pain level controlled Vital Signs Assessment: post-procedure vital signs reviewed and stable Respiratory status: spontaneous breathing, nonlabored ventilation, respiratory function stable and patient connected to nasal cannula oxygen Cardiovascular status: blood pressure returned to baseline and stable Postop Assessment: no apparent nausea or vomiting Anesthetic complications: no     Last Vitals:  Vitals:   04/21/18 2206 04/22/18 0547  BP: 120/65 128/60  Pulse: 68 67  Resp: 16 16  Temp: 37.1 C 36.9 C  SpO2: 94% 96%    Last Pain:  Vitals:   04/22/18 0752  TempSrc:   PainSc: 0-No pain                 Durenda Hurt

## 2018-04-25 ENCOUNTER — Telehealth: Payer: Self-pay

## 2018-04-25 NOTE — Telephone Encounter (Signed)
Flagged on EMMI report for having unfilled prescriptions and questions about discharge papers.  First attempt to reach patient made 04/25/18 at 10:42am, however unable to reach patient.  Left voicemail encouraging callback. Will attempt at later time.

## 2018-04-26 LAB — SURGICAL PATHOLOGY

## 2018-04-26 NOTE — Telephone Encounter (Signed)
Second attempt to reach made on 04/26/18 at 2:11pm, however unable to reach.  Left another message encouraging callback if any questions or concerns.

## 2018-04-27 ENCOUNTER — Ambulatory Visit: Payer: 59 | Admitting: Gastroenterology

## 2018-04-27 ENCOUNTER — Encounter: Payer: Self-pay | Admitting: Gastroenterology

## 2018-04-27 VITALS — BP 162/75 | HR 85 | Resp 17 | Ht 70.0 in | Wt 137.4 lb

## 2018-04-27 DIAGNOSIS — I8510 Secondary esophageal varices without bleeding: Secondary | ICD-10-CM

## 2018-04-27 DIAGNOSIS — D500 Iron deficiency anemia secondary to blood loss (chronic): Secondary | ICD-10-CM

## 2018-04-27 DIAGNOSIS — K7030 Alcoholic cirrhosis of liver without ascites: Secondary | ICD-10-CM

## 2018-04-27 MED ORDER — PROPRANOLOL HCL 20 MG PO TABS: 20.0000 mg | ORAL_TABLET | Freq: Two times a day (BID) | ORAL | 0 refills | Status: DC

## 2018-04-27 NOTE — Progress Notes (Signed)
Manuel Darby, MD [address]  [place]  [place], [place] [address]  Main: [phone]  Fax: [phone]    Gastroenterology Consultation  Referring Provider:     [name] * Primary Care Physician:  [name], DO Primary Gastroenterologist:  [name] Reason for Consultation:     Alcoholic cirrhosis        HPI:   Manuel Jimenez is a 57 y.o. male referred by [name], [name], DO  for consultation & management of alcoholic cirrhosis.  Patient recently admitted to [facility], last week secondary to 2 days history of black tarry stools, severe symptomatic anemia.  He has history of chronic iron deficiency anemia, chronic thrombocytopenia his hemoglobin on admission was 6.5. Patient received 2 units of PRBCs, hemoglobin is 7.4. Hemoglobin prior to admission was 12.6. Last normal was 15 in 01/2017. Patient has history of chronic iron deficiency anemia and based on the prior endoscopic evaluation which was EGD and colonoscopy, he was found to have moderate portal hypertensive gastropathy and adenomas in the colon. He has been seeing Dr. [name] receiving parenteral iron therapy. His ferritin levels have been chronically low since 04/2016 Other than generalized weakness, patient denies chest pain, abdominal pain, ascites, swelling of legs, hematemesis, f/c. Patient was started on pantoprazole drip, octreotide drip. I performed his EGD which revealed large esophageal varix with stigmata of recent bleeding.  I performed one banding.  He received 2 doses of IV iron in the hospital His last drink of ETOH was in 04/2015  Interval summary: Patient reports that he has been doing well since discharge.  He denies any symptoms of anemia.  He reports that his stools are brown.  He is taking oral iron which is causing some stomach discomfort.  NSAIDs: None  Antiplts/Anticoagulants/Anti thrombotics: None  GI Procedures:  EGD 04/21/2018 - Normal duodenal bulb  and second portion of the duodenum. Biopsied. - Portal hypertensive gastropathy. Biopsied. - Non-bleeding gastric ulcers with a clean ulcer base ([place] Class III). - Esophagogastric landmarks identified. - Recently bleeding large (> 5 mm) esophageal varices. Completely eradicated. Banded. DIAGNOSIS:  A. DUODENUM; COLD BIOPSY:  - DUODENOPATHY WITH VILLOUS BLUNTING.  - DECREASED NUMBER OF PLASMA CELLS, SEE COMMENT.  - NO INTRAEPITHELIAL LYMPHOCYTOSIS.   B. STOMACH; COLD BIOPSY:  - EDEMA.  - NEGATIVE FOR H. PYLORI, INTESTINAL METAPLASIA, DYSPLASIA, AND  MALIGNANCY.  EGD 02/02/2017 for iron deficiency anemia - Grade I esophageal varices. - moderate Portal hypertensive gastropathy. - Normal examined duodenum. - No specimens collected.  Colonoscopy 02/02/2017 - One 3 mm polyp in the descending colon, removed with a cold biopsy forceps. Resected and retrieved. Clip (MR conditional) was placed. - One 10 mm polyp in the sigmoid colon, removed with a hot snare. Resected and retrieved. Clip (MR conditional) was placed. - Four 5 to 6 mm polyps in the sigmoid colon, removed with a cold snare. Resected and retrieved. - Diverticulosis in the sigmoid colon. - Non-bleeding internal hemorrhoids.   EGD 05/14/2015 - Benign-appearing esophageal stricture. Dilated. - The examination was otherwise normal. - Portal hypertensive gastropathy. - The examination was otherwise normal. - Normal examined duodenum. - No specimens collected.  Colonoscopy 06/17/2015 - Diverticulosis in the ascending colon. - One small polyp in the ascending colon. Resected and retrieved. - One small polyp in the descending colon. Resected and retrieved. - One large polyp in the sigmoid colon. Resected and retrieved. - The examination was otherwise normal. - The examination was otherwise normal. -  The examined portion of the ileum was normal. DIAGNOSIS:  A. COLON POLYP, ASCENDING; BIOPSY:  - TUBULAR ADENOMA, 2  FRAGMENTS.  - NEGATIVE FOR HIGH-GRADE DYSPLASIA AND MALIGNANCY.   B. COLON POLYP, DESCENDING; BIOPSY:  - COLONIC MUCOSA WITHIN NORMAL LIMITS.   C. COLON POLYP, SIGMOID; BIOPSY:  - TUBULOVILLOUS ADENOMA, FRAGMENTS.  - NEGATIVE FOR HIGH-GRADE DYSPLASIA AND MALIGNANCY.   Past Medical History:  Diagnosis Date  . Anemia   . Cancer ([place])   . Cirrhosis ([place])   . Colon polyps   . Constipation   . Diarrhea   . Hemorrhoids   . Hypertension    in past  . Portal vein thrombosis    see chart review 06/05/15  . Shingles 09/2016  . Stomach ulcer   . Weight loss     Past Surgical History:  Procedure Laterality Date  . COLONOSCOPY N/A 06/17/2015   Procedure: COLONOSCOPY;  Surgeon: Hulen Luster, MD;  Location: [facility];  Service: Gastroenterology;  Laterality: N/A;  . COLONOSCOPY WITH PROPOFOL N/A 02/02/2017   Procedure: COLONOSCOPY WITH PROPOFOL;  Surgeon: [name], MD;  Location: [facility];  Service: Endoscopy;  Laterality: N/A;  . ENTEROSCOPY N/A 04/21/2018   Procedure: ENTEROSCOPY;  Surgeon: Lin Landsman, MD;  Location: [facility] ENDOSCOPY;  Service: Gastroenterology;  Laterality: N/A;  . ESOPHAGOGASTRODUODENOSCOPY N/A 05/14/2015   Procedure: ESOPHAGOGASTRODUODENOSCOPY (EGD);  Surgeon: Hulen Luster, MD;  Location: [facility] ENDOSCOPY;  Service: Endoscopy;  Laterality: N/A;  . ESOPHAGOGASTRODUODENOSCOPY N/A 04/21/2018   Procedure: ESOPHAGOGASTRODUODENOSCOPY (EGD);  Surgeon: Lin Landsman, MD;  Location: [facility] ENDOSCOPY;  Service: Gastroenterology;  Laterality: N/A;  . ESOPHAGOGASTRODUODENOSCOPY (EGD) WITH PROPOFOL N/A 02/02/2017   Procedure: ESOPHAGOGASTRODUODENOSCOPY (EGD) WITH PROPOFOL;  Surgeon: [name], MD;  Location: [facility];  Service: Endoscopy;  Laterality: N/A;  . POLYPECTOMY  02/02/2017   Procedure: POLYPECTOMY;  Surgeon: [name], MD;  Location: [facility];  Service: Endoscopy;;     Current Outpatient Medications:  .  FERREX 150 150  MG capsule, TAKE 1 CAPSULE BY MOUTH EVERY DAY, Disp: 30 capsule, Rfl: 2 .  loratadine (CLARITIN) 10 MG tablet, Take 10 mg by mouth daily., Disp: , Rfl:  .  pantoprazole (PROTONIX) 40 MG tablet, Take 1 tablet (40 mg total) by mouth 2 (two) times daily., Disp: 60 tablet, Rfl: 0 .  propranolol (INDERAL) 20 MG tablet, Take 1 tablet (20 mg total) by mouth 2 (two) times daily., Disp: 60 tablet, Rfl: 0   Family History  Problem Relation Age of Onset  . CAD Father   . Heart attack Father      Social History   Tobacco Use  . Smoking status: Current Every Day Smoker    Packs/day: 2.00    Years: 40.00    Pack years: 80.00    Types: Cigarettes  . Smokeless tobacco: Never Used  . Tobacco comment: Only successfully quit for 1 month before due to acute illness. (01/24/17 - down to 1.5 PPD)  Substance Use Topics  . 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  . Drug use: No    Allergies as of 04/27/2018 - Review Complete 04/27/2018  Allergen Reaction Noted  . Lorazepam Other (See Comments) 04/06/2016  . Iron Nausea Only 01/24/2017    Review of Systems:    All systems reviewed and negative except where noted in HPI.   Physical Exam:  BP (!) 162/75 (BP Location: Left Arm, Patient Position: Sitting, Cuff  Size: Normal)   Pulse 85   Resp 17   Ht 5\' 10"  (1.778 m)   Wt 137 lb 6.4 oz (62.3 kg)   BMI 19.71 kg/m  No LMP for male patient.  General:   Alert, thin built, pleasant and cooperative in NAD Head:  Normocephalic and atraumatic. Eyes:  Sclera clear, no icterus.   Conjunctiva pink. Ears:  Normal auditory acuity. Nose:  No deformity, discharge, or lesions. Mouth:  No deformity or lesions,oropharynx pink & moist. Neck:  Supple; no masses or thyromegaly. Lungs:  Respirations even and unlabored.  Clear throughout to auscultation.   No wheezes, crackles, or rhonchi. No acute distress. Heart:  Regular rate and rhythm; no murmurs, clicks, rubs, or gallops. Abdomen:   Normal bowel sounds. Soft, non-tender and non-distended without masses, hepatosplenomegaly or hernias noted.  No guarding or rebound tenderness.   Rectal: Not performed Msk:  Symmetrical without gross deformities. Good, equal movement & strength bilaterally. Pulses:  Normal pulses noted. Extremities:  No clubbing or edema.  No cyanosis. Neurologic:  Alert and oriented x3;  grossly normal neurologically. Skin:  Intact without significant lesions or rashes. No jaundice. Lymph Nodes:  No significant cervical adenopathy. Psych:  Alert and cooperative. Normal mood and affect.  Imaging Studies: Reviewed  Assessment and Plan:   Manuel Jimenez is a 57 y.o. 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  Risks and benefits of this medication discussed and patient agreeable to start Recommend repeat EGD in 4 weeks, continue Protonix 40 mg daily HCC screening: AFP and ultrasound with no liver lesions  Chronic iron deficiency anemia: Recommend video capsule endoscopy  History of tubular adenomas of the colon: Last colonoscopy in 2018 by [name] Recommend repeat colonoscopy in the next 2 to 3 years   Follow up in 1-2  months   Manuel Darby, MD

## 2018-04-28 LAB — IRON AND TIBC
IRON: 21 ug/dL — AB (ref 38–169)
Iron Saturation: 6 % — CL (ref 15–55)
Total Iron Binding Capacity: 337 ug/dL (ref 250–450)
UIBC: 316 ug/dL (ref 111–343)

## 2018-04-28 LAB — CBC
Hematocrit: 29.2 % — ABNORMAL LOW (ref 37.5–51.0)
Hemoglobin: 8.8 g/dL — ABNORMAL LOW (ref 13.0–17.7)
MCH: 25.5 pg — AB (ref 26.6–33.0)
MCHC: 30.1 g/dL — AB (ref 31.5–35.7)
MCV: 85 fL (ref 79–97)
Platelets: 85 10*3/uL — CL (ref 150–450)
RBC: 3.45 x10E6/uL — AB (ref 4.14–5.80)
RDW: 22.4 % — ABNORMAL HIGH (ref 12.3–15.4)
WBC: 5.1 10*3/uL (ref 3.4–10.8)

## 2018-04-28 LAB — FERRITIN: Ferritin: 126 ng/mL (ref 30–400)

## 2018-05-01 ENCOUNTER — Other Ambulatory Visit: Payer: Self-pay

## 2018-05-01 DIAGNOSIS — D500 Iron deficiency anemia secondary to blood loss (chronic): Secondary | ICD-10-CM

## 2018-05-04 ENCOUNTER — Telehealth: Payer: Self-pay | Admitting: Licensed Clinical Social Worker

## 2018-05-04 NOTE — Telephone Encounter (Signed)
CSW followed up with patient regarding an EMMI call. Patient informed CSW that he accidentally had chosen the wrong option and states he is actually doing wonderful. He thanked CSW for calling. Shela Leff MSW,LcSW [phone]

## 2018-05-19 ENCOUNTER — Other Ambulatory Visit: Payer: Self-pay | Admitting: Gastroenterology

## 2018-05-19 DIAGNOSIS — K7030 Alcoholic cirrhosis of liver without ascites: Secondary | ICD-10-CM

## 2018-05-19 DIAGNOSIS — I8510 Secondary esophageal varices without bleeding: Secondary | ICD-10-CM

## 2018-06-22 ENCOUNTER — Other Ambulatory Visit: Payer: Self-pay | Admitting: Gastroenterology

## 2018-06-22 DIAGNOSIS — I8510 Secondary esophageal varices without bleeding: Secondary | ICD-10-CM

## 2018-06-22 DIAGNOSIS — K7030 Alcoholic cirrhosis of liver without ascites: Secondary | ICD-10-CM

## 2018-07-16 ENCOUNTER — Other Ambulatory Visit: Payer: Self-pay | Admitting: Gastroenterology

## 2018-07-16 DIAGNOSIS — K7030 Alcoholic cirrhosis of liver without ascites: Secondary | ICD-10-CM

## 2018-07-16 DIAGNOSIS — I8510 Secondary esophageal varices without bleeding: Secondary | ICD-10-CM

## 2018-09-06 ENCOUNTER — Other Ambulatory Visit: Payer: Self-pay | Admitting: *Deleted

## 2018-09-06 DIAGNOSIS — D500 Iron deficiency anemia secondary to blood loss (chronic): Secondary | ICD-10-CM

## 2018-09-07 ENCOUNTER — Inpatient Hospital Stay: Payer: 59 | Attending: Oncology

## 2018-09-07 ENCOUNTER — Telehealth: Payer: Self-pay | Admitting: *Deleted

## 2018-09-07 DIAGNOSIS — D509 Iron deficiency anemia, unspecified: Secondary | ICD-10-CM | POA: Insufficient documentation

## 2018-09-07 DIAGNOSIS — F17210 Nicotine dependence, cigarettes, uncomplicated: Secondary | ICD-10-CM | POA: Diagnosis not present

## 2018-09-07 DIAGNOSIS — I10 Essential (primary) hypertension: Secondary | ICD-10-CM | POA: Diagnosis not present

## 2018-09-07 DIAGNOSIS — Z79899 Other long term (current) drug therapy: Secondary | ICD-10-CM | POA: Insufficient documentation

## 2018-09-07 DIAGNOSIS — D696 Thrombocytopenia, unspecified: Secondary | ICD-10-CM | POA: Diagnosis not present

## 2018-09-07 DIAGNOSIS — D500 Iron deficiency anemia secondary to blood loss (chronic): Secondary | ICD-10-CM

## 2018-09-07 LAB — CBC WITH DIFFERENTIAL/PLATELET
ABS IMMATURE GRANULOCYTES: 0.05 10*3/uL (ref 0.00–0.07)
BASOS PCT: 1 %
Basophils Absolute: 0 10*3/uL (ref 0.0–0.1)
Eosinophils Absolute: 0.1 10*3/uL (ref 0.0–0.5)
Eosinophils Relative: 1 %
HCT: 33 % — ABNORMAL LOW (ref 39.0–52.0)
HEMOGLOBIN: 9.3 g/dL — AB (ref 13.0–17.0)
Immature Granulocytes: 1 %
Lymphocytes Relative: 10 %
Lymphs Abs: 0.7 10*3/uL (ref 0.7–4.0)
MCH: 20.6 pg — AB (ref 26.0–34.0)
MCHC: 28.2 g/dL — ABNORMAL LOW (ref 30.0–36.0)
MCV: 73 fL — ABNORMAL LOW (ref 80.0–100.0)
MONO ABS: 0.6 10*3/uL (ref 0.1–1.0)
Monocytes Relative: 8 %
NEUTROS ABS: 6 10*3/uL (ref 1.7–7.7)
Neutrophils Relative %: 79 %
PLATELETS: 92 10*3/uL — AB (ref 150–400)
RBC: 4.52 MIL/uL (ref 4.22–5.81)
RDW: 19.9 % — ABNORMAL HIGH (ref 11.5–15.5)
WBC: 7.5 10*3/uL (ref 4.0–10.5)
nRBC: 0 % (ref 0.0–0.2)

## 2018-09-07 LAB — IRON AND TIBC
IRON: 17 ug/dL — AB (ref 45–182)
SATURATION RATIOS: 4 % — AB (ref 17.9–39.5)
TIBC: 393 ug/dL (ref 250–450)
UIBC: 376 ug/dL

## 2018-09-07 LAB — FERRITIN: FERRITIN: 11 ng/mL — AB (ref 24–336)

## 2018-09-07 NOTE — Telephone Encounter (Signed)
Patient notified of cbc results from today. Per Dr. [name] results are improving, will wait for results or iron stores and ferritin before scheduling for iron infusion. Patient informed these results will most likely not be available until Monday.

## 2018-09-14 ENCOUNTER — Encounter: Payer: Self-pay | Admitting: Oncology

## 2018-09-14 ENCOUNTER — Inpatient Hospital Stay: Payer: 59 | Admitting: Oncology

## 2018-09-14 ENCOUNTER — Other Ambulatory Visit: Payer: Self-pay

## 2018-09-14 ENCOUNTER — Inpatient Hospital Stay: Payer: 59

## 2018-09-14 VITALS — BP 168/87 | HR 72 | Temp 97.0°F | Wt 133.0 lb

## 2018-09-14 VITALS — BP 161/77 | HR 69 | Temp 97.0°F | Resp 18

## 2018-09-14 DIAGNOSIS — D509 Iron deficiency anemia, unspecified: Secondary | ICD-10-CM | POA: Diagnosis not present

## 2018-09-14 DIAGNOSIS — I10 Essential (primary) hypertension: Secondary | ICD-10-CM | POA: Diagnosis not present

## 2018-09-14 DIAGNOSIS — Z79899 Other long term (current) drug therapy: Secondary | ICD-10-CM

## 2018-09-14 DIAGNOSIS — F17210 Nicotine dependence, cigarettes, uncomplicated: Secondary | ICD-10-CM | POA: Diagnosis not present

## 2018-09-14 DIAGNOSIS — D500 Iron deficiency anemia secondary to blood loss (chronic): Secondary | ICD-10-CM

## 2018-09-14 DIAGNOSIS — D696 Thrombocytopenia, unspecified: Secondary | ICD-10-CM

## 2018-09-14 MED ORDER — SODIUM CHLORIDE 0.9 % IV SOLN
510.0000 mg | Freq: Once | INTRAVENOUS | Status: AC
  Administered 2018-09-14: 510 mg via INTRAVENOUS
  Filled 2018-09-14: qty 17

## 2018-09-14 MED ORDER — SODIUM CHLORIDE 0.9 % IV SOLN
Freq: Once | INTRAVENOUS | Status: AC
  Administered 2018-09-14: 10:00:00 via INTRAVENOUS
  Filled 2018-09-14: qty 250

## 2018-09-14 NOTE — Progress Notes (Signed)
[place]  [phone] [phone]  ID: Manuel Jimenez OB: Jan 10, 1961  MR#: [id number]  BHA#:[id number]  Patient Care Team: [name], DO as PCP - General (Family Medicine) Lloyd Huger, MD as Consulting Physician (Hematology and Oncology)  CHIEF COMPLAINT: Iron deficiency anemia, thrombocytopenia  INTERVAL HISTORY: Patient returns to clinic today for repeat laboratory work and further evaluation.  He travels a significant amount for work, but has noted increasing weakness and fatigue over the past several weeks.  He otherwise feels well.  He has no neurologic complaints. He denies any recent fevers or illnesses. He has a good appetite and denies weight loss. He denies any chest pain or shortness of breath. He denies any nausea, vomiting, constipation, or diarrhea.  He denies any melena or hematochezia.  He has no urinary complaints.  Patient offers no further specific complaints today.  REVIEW OF SYSTEMS:   Review of Systems  Constitutional: Positive for malaise/fatigue. Negative for fever and weight loss.  Respiratory: Negative.  Negative for cough, hemoptysis and shortness of breath.   Cardiovascular: Negative.  Negative for chest pain and leg swelling.  Gastrointestinal: Negative for abdominal pain, blood in stool and melena.  Genitourinary: Negative.  Negative for hematuria.  Musculoskeletal: Negative.   Skin: Negative.  Negative for rash.  Neurological: Positive for weakness. Negative for sensory change and focal weakness.  Psychiatric/Behavioral: Negative.  The patient is not nervous/anxious.     As per HPI. Otherwise, a complete review of systems is negative.  PAST MEDICAL HISTORY: Past Medical History:  Diagnosis Date  . Anemia   . Cancer ([place])   . Cirrhosis ([place])   . Colon polyps   . Constipation   . Diarrhea   . Hemorrhoids   . Hypertension    in past  . Portal vein thrombosis     see chart review 06/05/15  . Shingles 09/2016  . Stomach ulcer   . Weight loss     PAST SURGICAL HISTORY: Past Surgical History:  Procedure Laterality Date  . COLONOSCOPY N/A 06/17/2015   Procedure: COLONOSCOPY;  Surgeon: Hulen Luster, MD;  Location: [place];  Service: Gastroenterology;  Laterality: N/A;  . COLONOSCOPY WITH PROPOFOL N/A 02/02/2017   Procedure: COLONOSCOPY WITH PROPOFOL;  Surgeon: [name], MD;  Location: [place];  Service: Endoscopy;  Laterality: N/A;  . ENTEROSCOPY N/A 04/21/2018   Procedure: ENTEROSCOPY;  Surgeon: Lin Landsman, MD;  Location: [facility] ENDOSCOPY;  Service: Gastroenterology;  Laterality: N/A;  . ESOPHAGOGASTRODUODENOSCOPY N/A 05/14/2015   Procedure: ESOPHAGOGASTRODUODENOSCOPY (EGD);  Surgeon: Hulen Luster, MD;  Location: [facility] ENDOSCOPY;  Service: Endoscopy;  Laterality: N/A;  . ESOPHAGOGASTRODUODENOSCOPY N/A 04/21/2018   Procedure: ESOPHAGOGASTRODUODENOSCOPY (EGD);  Surgeon: Lin Landsman, MD;  Location: [facility] ENDOSCOPY;  Service: Gastroenterology;  Laterality: N/A;  . ESOPHAGOGASTRODUODENOSCOPY (EGD) WITH PROPOFOL N/A 02/02/2017   Procedure: ESOPHAGOGASTRODUODENOSCOPY (EGD) WITH PROPOFOL;  Surgeon: [name], MD;  Location: [place];  Service: Endoscopy;  Laterality: N/A;  . POLYPECTOMY  02/02/2017   Procedure: POLYPECTOMY;  Surgeon: [name], MD;  Location: [place];  Service: Endoscopy;;    FAMILY HISTORY: Family History  Problem Relation Age of Onset  . CAD Father   . Heart attack Father     ADVANCED DIRECTIVES (Y/N):  N  HEALTH MAINTENANCE: Social History   Tobacco Use  . Smoking status: Current Every Day Smoker    Packs/day: 2.00    Years: 40.00    Pack years: 80.00  Types: Cigarettes  . Smokeless tobacco: Never Used  . Tobacco comment: Only successfully quit for 1 month before due to acute illness. (01/24/17 - down to 1.5 PPD)  Substance Use Topics  . 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  . Drug use: No     Colonoscopy:  PAP:  Bone density:  Lipid panel:  Allergies  Allergen Reactions  . Lorazepam Other (See Comments)    "made me anxious and mean"   . Iron Nausea Only    Current Outpatient Medications  Medication Sig Dispense Refill  . FERREX 150 150 MG capsule TAKE 1 CAPSULE BY MOUTH EVERY DAY 30 capsule 2  . loratadine (CLARITIN) 10 MG tablet Take 10 mg by mouth daily.    . propranolol (INDERAL) 20 MG tablet TAKE 1 TABLET BY MOUTH TWICE A DAY 60 tablet 0  . pantoprazole (PROTONIX) 40 MG tablet Take 1 tablet (40 mg total) by mouth 2 (two) times daily. 60 tablet 0   No current facility-administered medications for this visit.     OBJECTIVE: Vitals:   09/14/18 0930  BP: (!) 168/87  Pulse: 72  Temp: (!) 97 F (36.1 C)     Body mass index is 19.08 kg/m.    ECOG FS:0 - Asymptomatic  General: Well-developed, well-nourished, no acute distress. Eyes: Pink conjunctiva, anicteric sclera. HEENT: Normocephalic, moist mucous membranes. Lungs: Clear to auscultation bilaterally. Heart: Regular rate and rhythm. No rubs, murmurs, or gallops. Abdomen: Soft, nontender, nondistended. No organomegaly noted, normoactive bowel sounds. Musculoskeletal: No edema, cyanosis, or clubbing. Neuro: Alert, answering all questions appropriately. Cranial nerves grossly intact. Skin: No rashes or petechiae noted. Psych: Normal affect.  LAB RESULTS:  Lab Results  Component Value Date   NA 143 04/21/2018   K 3.8 04/21/2018   CL 114 (H) 04/21/2018   CO2 22 04/21/2018   GLUCOSE 97 04/21/2018   BUN 38 (H) 04/21/2018   CREATININE 0.63 04/21/2018   CALCIUM 8.2 (L) 04/21/2018   PROT 5.9 (L) 04/20/2018   ALBUMIN 3.6 04/20/2018   AST 19 04/20/2018   ALT 17 04/20/2018   ALKPHOS 65 04/20/2018   BILITOT 0.3 04/20/2018   GFRNONAA >60 04/21/2018   GFRAA >60 04/21/2018    Lab Results  Component Value Date   WBC 7.5 09/07/2018    NEUTROABS 6.0 09/07/2018   HGB 9.3 (L) 09/07/2018   HCT 33.0 (L) 09/07/2018   MCV 73.0 (L) 09/07/2018   PLT 92 (L) 09/07/2018   Lab Results  Component Value Date   IRON 17 (L) 09/07/2018   TIBC 393 09/07/2018   IRONPCTSAT 4 (L) 09/07/2018   Lab Results  Component Value Date   FERRITIN 11 (L) 09/07/2018     STUDIES: No results found.  ASSESSMENT: Iron deficiency anemia, thrombocytopenia  PLAN:    1. Iron deficiency anemia: Likely secondary to GI blood loss. Colonoscopy and EGD completed on February 02, 2017 removed 6 polyps noted esophageal varices as well as portal hypertension.  The patient's hemoglobin has mildly improved, his iron stores have trended down and he is symptomatic.  Previously, the remainder of his laboratory work is either negative or within normal limits.  Patient cannot tolerate oral iron supplementation.  Proceed with 510 mg IV Feraheme today.  Return to clinic next week for second infusion and then in 3 months with repeat laboratory work and further evaluation. 2. Thrombocytopenia: Chronic and unchanged.  Patient's platelet count is 92 which is approximately  his baseline.  Likely secondary to ongoing cirrhosis. Abdominal ultrasound on January 25, 2017 did not report any splenomegaly. No intervention is needed at this time. Monitor.  3. Liver lesions: Patient was noted to have several poorly defined low-attenuation lesions in his left hepatic lobe on CT scan on June 02, 2015. No follow-up CT scan has been completed for evaluation. Patient's AFP is within normal limits. Will defer any repeat imaging to GI.  Patient expressed understanding and was in agreement with this plan. He also understands that He can call clinic at any time with any questions, concerns, or complaints.    Lloyd Huger, MD   09/14/2018 1:01 PM

## 2018-09-14 NOTE — Progress Notes (Signed)
Patient here today for follow up regarding anemia. Patient reports fatigue, denies other concerns today.

## 2018-09-21 ENCOUNTER — Inpatient Hospital Stay: Payer: 59

## 2018-09-21 VITALS — BP 108/69 | HR 67 | Temp 97.4°F | Resp 18

## 2018-09-21 DIAGNOSIS — D509 Iron deficiency anemia, unspecified: Secondary | ICD-10-CM | POA: Diagnosis not present

## 2018-09-21 DIAGNOSIS — D500 Iron deficiency anemia secondary to blood loss (chronic): Secondary | ICD-10-CM

## 2018-09-21 MED ORDER — SODIUM CHLORIDE 0.9 % IV SOLN
Freq: Once | INTRAVENOUS | Status: AC
  Administered 2018-09-21: 14:00:00 via INTRAVENOUS
  Filled 2018-09-21: qty 250

## 2018-09-21 MED ORDER — SODIUM CHLORIDE 0.9 % IV SOLN
510.0000 mg | Freq: Once | INTRAVENOUS | Status: AC
  Administered 2018-09-21: 510 mg via INTRAVENOUS
  Filled 2018-09-21: qty 17

## 2018-09-21 NOTE — Patient Instructions (Signed)

## 2018-12-12 ENCOUNTER — Other Ambulatory Visit: Payer: 59

## 2018-12-13 ENCOUNTER — Ambulatory Visit: Payer: 59 | Admitting: Hematology and Oncology

## 2018-12-13 ENCOUNTER — Ambulatory Visit: Payer: 59

## 2019-01-01 ENCOUNTER — Inpatient Hospital Stay: Payer: 59 | Attending: Hematology and Oncology

## 2019-01-01 ENCOUNTER — Other Ambulatory Visit: Payer: Self-pay

## 2019-01-01 DIAGNOSIS — D500 Iron deficiency anemia secondary to blood loss (chronic): Secondary | ICD-10-CM | POA: Insufficient documentation

## 2019-01-01 DIAGNOSIS — Z79899 Other long term (current) drug therapy: Secondary | ICD-10-CM | POA: Insufficient documentation

## 2019-01-01 DIAGNOSIS — D696 Thrombocytopenia, unspecified: Secondary | ICD-10-CM | POA: Insufficient documentation

## 2019-01-01 DIAGNOSIS — K766 Portal hypertension: Secondary | ICD-10-CM | POA: Diagnosis not present

## 2019-01-01 DIAGNOSIS — K7030 Alcoholic cirrhosis of liver without ascites: Secondary | ICD-10-CM | POA: Diagnosis not present

## 2019-01-01 DIAGNOSIS — F17210 Nicotine dependence, cigarettes, uncomplicated: Secondary | ICD-10-CM | POA: Diagnosis not present

## 2019-01-01 DIAGNOSIS — R162 Hepatomegaly with splenomegaly, not elsewhere classified: Secondary | ICD-10-CM | POA: Insufficient documentation

## 2019-01-01 DIAGNOSIS — K3189 Other diseases of stomach and duodenum: Secondary | ICD-10-CM | POA: Diagnosis not present

## 2019-01-01 DIAGNOSIS — R5383 Other fatigue: Secondary | ICD-10-CM | POA: Diagnosis not present

## 2019-01-01 DIAGNOSIS — I8510 Secondary esophageal varices without bleeding: Secondary | ICD-10-CM | POA: Diagnosis not present

## 2019-01-01 DIAGNOSIS — K648 Other hemorrhoids: Secondary | ICD-10-CM | POA: Insufficient documentation

## 2019-01-01 DIAGNOSIS — K922 Gastrointestinal hemorrhage, unspecified: Secondary | ICD-10-CM | POA: Diagnosis present

## 2019-01-01 DIAGNOSIS — Z8249 Family history of ischemic heart disease and other diseases of the circulatory system: Secondary | ICD-10-CM | POA: Insufficient documentation

## 2019-01-01 LAB — CBC WITH DIFFERENTIAL/PLATELET
Abs Immature Granulocytes: 0.02 10*3/uL (ref 0.00–0.07)
Basophils Absolute: 0 10*3/uL (ref 0.0–0.1)
Basophils Relative: 1 %
Eosinophils Absolute: 0.1 10*3/uL (ref 0.0–0.5)
Eosinophils Relative: 2 %
HCT: 35.7 % — ABNORMAL LOW (ref 39.0–52.0)
Hemoglobin: 10.7 g/dL — ABNORMAL LOW (ref 13.0–17.0)
Immature Granulocytes: 0 %
Lymphocytes Relative: 12 %
Lymphs Abs: 0.5 10*3/uL — ABNORMAL LOW (ref 0.7–4.0)
MCH: 23.9 pg — ABNORMAL LOW (ref 26.0–34.0)
MCHC: 30 g/dL (ref 30.0–36.0)
MCV: 79.9 fL — ABNORMAL LOW (ref 80.0–100.0)
Monocytes Absolute: 0.4 10*3/uL (ref 0.1–1.0)
Monocytes Relative: 10 %
Neutro Abs: 3.4 10*3/uL (ref 1.7–7.7)
Neutrophils Relative %: 75 %
Platelets: 76 10*3/uL — ABNORMAL LOW (ref 150–400)
RBC: 4.47 MIL/uL (ref 4.22–5.81)
RDW: 18.6 % — ABNORMAL HIGH (ref 11.5–15.5)
WBC: 4.5 10*3/uL (ref 4.0–10.5)
nRBC: 0 % (ref 0.0–0.2)

## 2019-01-01 LAB — IRON AND TIBC
Iron: 22 ug/dL — ABNORMAL LOW (ref 45–182)
Saturation Ratios: 5 % — ABNORMAL LOW (ref 17.9–39.5)
TIBC: 421 ug/dL (ref 250–450)
UIBC: 399 ug/dL

## 2019-01-01 LAB — FERRITIN: Ferritin: 6 ng/mL — ABNORMAL LOW (ref 24–336)

## 2019-01-01 NOTE — Progress Notes (Signed)
[facility]  [address] [place], [place] [address] Phone: [phone]  Fax: [phone]   Clinic Day:  01/02/2019  Referring physician: [name] *  Chief Complaint: Manuel Jimenez is a 58 y.o. male with iron deficiency anemia secondary to GI bleed, thrombocytopenia, and liver mass who is seen for new patient assessment.  HPI:   The patient was initially seen in the hematology and oncology clinic on 06/12/2015 by Dr. Delight Hoh following a hospitalization for severe anemia from GI bleeding.  He required 3 units of PRBCs. He had a history of heavy alcohol and NSAID use.   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 There was splenomegaly and recanalization of periumbilical veins, consistent with portal venous hypertension. There was hepatomegaly, with several subtle ill-defined lesions in the lateral segment of the left hepatic lobe measuring up to 2.5 cm. There were findings suspicious for right colonic diverticulitis. There was ill-defined soft tissue density with central fluid and gas collection within the right lower quadrant mesentery.  Liver MRI on 06/11/2015 revealed mild cirrhosis, without suspicious liver lesion. The lateral segment left liver lobe abnormality on CT was likely due to altered perfusion and possibly focal steatosis. There was nonocclusive thrombus in the right and main portal veins and occlusive thrombus in the superior mesenteric vein. There was gastric under-distention, and apparent wall thickening could be secondary. Gastritis could not be excluded.  Given his heavy alcohol use and cirrhosis, he was felt at high risk for hepatocellular carcinoma.  Given his recent GI bleeding, anticoagulation was not recommended.  He has been followed for management of his iron deficiency anemia.  He was seen by [name], gastroenterology, on 01/19/2017.   Hepatits A and B serologies were negative.  He was scheduled for a RUQ ultrasound. He will be followed in the future by Dr. Marius Ditch.   Right upper quadrant ultrasound on 01/25/2017 revealed cirrhotic changes within the liver without evidence of discrete masses nor ascites. There was nonocclusive thrombus in the main portal vein. There was a gallbladder polyp but no evidence of stones or acute cholecystitis. There was mild gallbladder wall thickening to 5.5 mm.  EGD on 02/02/2017 revealed grade I esophageal varices and portal hypertensive gastropathy. The examined duodenum was normal.  Colonoscopy on 02/02/2017 revealed one 3 mm polyp in the descending colon, one 10 mm polyp in the sigmoid colon, and four 5 to 6 mm polyps in the sigmoid colon. There was diverticulosis in the sigmoid colon and non-bleeding internal hemorrhoids. Pathology revealed hyperplastic polyp in the descending colon and a hyperplastic polyp, tubulovillous adenoma and 2 tubular adenomas in the sigmoid colon.   EGD on 04/21/2018 revealed normal duodenal bulb and second portion of the duodenum. There was portal hypertensive gastropathy and non-bleeding gastric ulcers with a clean ulcer base. There were recently bleeding large (> 5 mm) esophageal varices.  The patient was last seen in the medical oncology and hematology clinic on 09/14/2018 by Dr. [name].  At that time, he was increasingly fatigued and weak.  Appetite was good. He received Feraheme x2 over 2 weeks.   Liver ultrasound and doppler on 04/21/2018 revealed persistent nonocclusive thrombus in the distal portion of the main portal vein.  The amount of thrombus and degree of distal portal vein distension appears less prominent compared to the prior ultrasound in 12/2016.  Ferritin has been followed: 19 on 04/18/2016, 5 on 11/21/2016, 50 on 01/31/2017, 6 on  06/14/2017, 10 on 01/18/2018, 7 on 04/20/2018, 126 on 04/27/2018, 11 on 09/07/2018, and 6 on 01/01/2019.   He received  Feraheme on 12/14/2016, 12/21/2016, 06/16/2017, 06/30/2017, 10/13/2017, 10/19/2017, 01/19/2018, 01/26/2018, 09/14/2018, and 09/21/2018.  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  Platelets have been followed: 127,000 on 05/11/2015, 121,000 on 05/12/2015, 140,000 on 05/13/2015, 220,000 on 06/05/2015, 119,000 on 04/18/2016, 78,000 on 11/21/2016, 68,000 on 01/19/2017, 90,000 on 06/14/2017, 121,000 on 10/13/2017, 107,000 on 04/20/2018, 75,000 on 04/21/2018, 85,000 on 04/27/2018, 92,000 on 09/07/2018, and 76,000 on 01/01/2019.   AFP has been followed:  3.3 on 06/05/2015, 1.8 on 01/19/2017, 2.9 on 10/12/2017, 1.7 on 04/20/2018, 1.4 on 04/21/2018, and 1.3 on 04/22/2018.  Labs on 01/01/2019: WBC 4,500, hemoglobin 10.7, hematocrit 35.7, platelets 76,000. Iron saturation was 5%. Ferritin 6.   During the interim, he has been doing well. He has mild congestion but denies cough or shortness of breath. He has bruising on his left arm from a weed-eater falling on him.   His diet includes meat 4x a week, green leafy vegetables 2-3x per week. He was gaining weight until a stomach bug for the past two weeks, which has caused him to lose weight. He takes oral iron once daily.   He currently smokes about 1-1 1/2 ppd, and has since age 37. He is not currently involved in the low dose chest CT program but is interested in it.   He has not had a liver US due to the COVID-19 pandemic. He is planning to follow-up with Dr. Marius Ditch for a capsule study.   Past Medical History:  Diagnosis Date  . Anemia   . Cancer ([place])   . Cirrhosis ([place])   . Colon polyps   . Constipation   . Diarrhea   . Hemorrhoids   . Hypertension    in past  . Portal vein thrombosis    see chart review 06/05/15  . Shingles 09/2016  . Stomach ulcer   . Weight loss     Past Surgical History:  Procedure Laterality Date  . COLONOSCOPY N/A 06/17/2015   Procedure: COLONOSCOPY;  Surgeon: Hulen Luster, MD;  Location: [place];  Service: Gastroenterology;  Laterality: N/A;  . COLONOSCOPY WITH PROPOFOL N/A 02/02/2017   Procedure: COLONOSCOPY WITH PROPOFOL;  Surgeon: [name], MD;  Location: [place];  Service: Endoscopy;  Laterality: N/A;  . ENTEROSCOPY N/A 04/21/2018   Procedure: ENTEROSCOPY;  Surgeon: Lin Landsman, MD;  Location: [facility] ENDOSCOPY;  Service: Gastroenterology;  Laterality: N/A;  . ESOPHAGOGASTRODUODENOSCOPY N/A 05/14/2015   Procedure: ESOPHAGOGASTRODUODENOSCOPY (EGD);  Surgeon: Hulen Luster, MD;  Location: [facility] ENDOSCOPY;  Service: Endoscopy;  Laterality: N/A;  . ESOPHAGOGASTRODUODENOSCOPY N/A 04/21/2018   Procedure: ESOPHAGOGASTRODUODENOSCOPY (EGD);  Surgeon: Lin Landsman, MD;  Location: [facility] ENDOSCOPY;  Service: Gastroenterology;  Laterality: N/A;  . ESOPHAGOGASTRODUODENOSCOPY (EGD) WITH PROPOFOL N/A 02/02/2017   Procedure: ESOPHAGOGASTRODUODENOSCOPY (EGD) WITH PROPOFOL;  Surgeon: [name], MD;  Location: [place];  Service: Endoscopy;  Laterality: N/A;  . POLYPECTOMY  02/02/2017   Procedure: POLYPECTOMY;  Surgeon: [name], MD;  Location: [place];  Service: Endoscopy;;    Family History  Problem Relation Age of Onset  . CAD Father   . Heart attack Father     Social History:  reports that he has been smoking cigarettes. He has a 80.00 pack-year smoking history. He has never used smokeless tobacco. He reports that he does not drink alcohol or use drugs. He stopped drinking on  04/07/2015. He currently smokes about 1-1 1/2 ppd, and has since age 59, about 65 years. He denies any known exposure to radiation or toxins. 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  He lives in [place].  The patient is alone today.  Allergies:  Allergies  Allergen Reactions  . Lorazepam Other (See Comments)    "made me anxious and mean"   . Iron Nausea Only    Current Medications: Current  Outpatient Medications  Medication Sig Dispense Refill  . FERREX 150 150 MG capsule TAKE 1 CAPSULE BY MOUTH EVERY DAY 30 capsule 2  . loratadine (CLARITIN) 10 MG tablet Take 10 mg by mouth daily.    . propranolol (INDERAL) 20 MG tablet TAKE 1 TABLET BY MOUTH TWICE A DAY 60 tablet 0  . pantoprazole (PROTONIX) 40 MG tablet Take 1 tablet (40 mg total) by mouth 2 (two) times daily. 60 tablet 0   No current facility-administered medications for this visit.     Review of Systems  Constitutional: Positive for malaise/fatigue. Negative for chills, diaphoresis, fever and weight loss (up 7 lbs since 09/2018).  HENT: Positive for congestion (mild). Negative for ear pain, hearing loss, nosebleeds, sinus pain and sore throat.   Eyes: Negative.  Negative for blurred vision, double vision and pain.  Respiratory: Negative.  Negative for cough, sputum production, shortness of breath and wheezing.   Cardiovascular: Negative.  Negative for chest pain, palpitations, claudication, leg swelling and PND.  Gastrointestinal: Negative.  Negative for abdominal pain, blood in stool, constipation, diarrhea, heartburn, melena, nausea and vomiting.  Genitourinary: Negative.  Negative for dysuria, frequency and urgency.  Musculoskeletal: Negative.  Negative for back pain, joint pain and myalgias.  Skin: Negative.  Negative for rash.  Neurological: Negative.  Negative for dizziness, tingling, sensory change, speech change, focal weakness, weakness and headaches.  Endo/Heme/Allergies: Negative.  Does not bruise/bleed easily.  Psychiatric/Behavioral: Negative.  Negative for depression and memory loss. The patient is not nervous/anxious and does not have insomnia.   All other systems reviewed and are negative.  Performance status (ECOG): 1  Blood pressure 137/87, pulse 74, temperature 97.9 F (36.6 C), height 5\' 11"  (1.803 m), weight 140 lb 1.6 oz (63.6 kg), SpO2 100 %.   Physical Exam  Constitutional: He is oriented to  person, place, and time. He appears well-developed and well-nourished. No distress.  HENT:  Head: Normocephalic and atraumatic.  Mouth/Throat: Oropharynx is clear and moist. No oropharyngeal exudate.  Thin short gray hair.  Wearing a mask.  Eyes: Pupils are equal, round, and reactive to light. Conjunctivae and EOM are normal. No scleral icterus.  Blue eyes.  Neck: Neck supple.  Cardiovascular: Normal rate, regular rhythm and normal heart sounds.  No murmur heard. Pulmonary/Chest: Effort normal and breath sounds normal. No respiratory distress. He has no wheezes.  Abdominal: Soft. Bowel sounds are normal. He exhibits no distension. There is splenomegaly (2 finger breadths below the left costal margin). There is no abdominal tenderness.  Musculoskeletal: Normal range of motion.        General: No edema.  Lymphadenopathy:    He has no cervical adenopathy.    He has no axillary adenopathy.       Right: No supraclavicular adenopathy present.       Left: No supraclavicular adenopathy present.  Neurological: He is alert and oriented to person, place, and time.  Skin: Skin is warm and dry.  Bruising (left arm) noted. No rash noted. He is not diaphoretic. No erythema.  Psychiatric: He has a normal mood and affect. His behavior is normal. Judgment and thought content normal.  Nursing note and vitals reviewed.   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 There was splenomegaly and recanalization of periumbilical veins, consistent with portal venous hypertension. There was hepatomegaly, with several subtle ill-defined lesions in the lateral segment of the left hepatic lobe measuring up to 2.5 cm. There were findings suspicious for right colonic diverticulitis. There was ill-defined soft tissue density with central fluid and gas collection within the right lower quadrant mesentery. 06/11/2015:  Liver MRI revealed  mild cirrhosis, without suspicious liver lesion. The lateral segment left liver lobe abnormality on CT was likely due to altered perfusion and possibly focal steatosis. There was nonocclusive thrombus in the right and main portal veins and occlusive thrombus in the superior mesenteric vein. There was gastric under-distention, and apparent wall thickening could be secondary. Gastritis could not be excluded. 01/25/2017:  Right upper quadrant ultrasound revealed cirrhotic changes within the liver without evidence of discrete masses nor ascites. There was nonocclusive thrombus in the main portal vein. There was a gallbladder polyp but no evidence of stones or acute cholecystitis. There was mild gallbladder wall thickening to 5.5 mm. 04/21/2018:  Liver ultrasound and doppler on 04/21/2018 revealed persistent nonocclusive thrombus in the distal portion of the main portal vein.  The amount of thrombus and degree of distal portal vein distension appears less prominent compared to the prior ultrasound in 12/2016.   Appointment on 01/01/2019  Component Date Value Ref Range Status  . Iron 01/01/2019 22* 45 - 182 ug/dL Final  . TIBC 01/01/2019 421  250 - 450 ug/dL Final  . Saturation Ratios 01/01/2019 5* 17.9 - 39.5 % Final  . UIBC 01/01/2019 399  ug/dL Final   Performed at [facility], [address], [place], [place] [address]  . Ferritin 01/01/2019 6* 24 - 336 ng/mL Final   Performed at [facility], [address] [place], [place] [address]  . WBC 01/01/2019 4.5  4.0 - 10.5 K/uL Final  . RBC 01/01/2019 4.47  4.22 - 5.81 MIL/uL Final  . Hemoglobin 01/01/2019 10.7* 13.0 - 17.0 g/dL Final  . HCT 01/01/2019 35.7* 39.0 - 52.0 % Final  . MCV 01/01/2019 79.9* 80.0 - 100.0 fL Final  . MCH 01/01/2019 23.9* 26.0 - 34.0 pg Final  . MCHC 01/01/2019 30.0  30.0 - 36.0 g/dL Final  . RDW 01/01/2019 18.6* 11.5 - 15.5 % Final  . Platelets 01/01/2019 76* 150 - 400 K/uL Final   Comment: Immature  Platelet Fraction may be clinically indicated, consider ordering this additional test [id number]   . nRBC 01/01/2019 0.0  0.0 - 0.2 % Final  . Neutrophils Relative % 01/01/2019 75  % Final  . Neutro Abs 01/01/2019 3.4  1.7 - 7.7 K/uL Final  . Lymphocytes Relative 01/01/2019 12  % Final  . Lymphs Abs 01/01/2019 0.5* 0.7 - 4.0 K/uL Final  . Monocytes Relative 01/01/2019 10  % Final  . Monocytes Absolute 01/01/2019 0.4  0.1 - 1.0 K/uL Final  . Eosinophils Relative 01/01/2019 2  % Final  . Eosinophils Absolute 01/01/2019 0.1  0.0 - 0.5 K/uL Final  . Basophils Relative 01/01/2019 1  % Final  . Basophils Absolute 01/01/2019 0.0  0.0 - 0.1 K/uL Final  . Immature Granulocytes 01/01/2019 0  %  Final  . Abs Immature Granulocytes 01/01/2019 0.02  0.00 - 0.07 K/uL Final   Performed at [facility], [address] [place], [place] [address]    Assessment:  ELLIOT MELDRUM is a 58 y.o. 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  Platelet count has ranged between 68,000 and 127,000 since 05/2015.    He received Feraheme on 12/14/2016, 12/21/2016, 06/16/2017, 06/30/2017, 10/13/2017, 10/19/2017, 01/19/2018, 01/26/2018, 09/14/2018, and 09/21/2018.  He received 4 units of PRBCs in 05/2015 and 2 units in 04/2018.  Ferritin has been followed: 19 on 04/18/2016, 5 on 11/21/2016, 50 on 01/31/2017, 6 on 06/14/2017, 10 on 01/18/2018, 7 on 04/20/2018, 126 on 04/27/2018, 11 on 09/07/2018, and 6 on 01/01/2019.   Liver ultrasound and doppler on 04/21/2018 revealed persistent nonocclusive thrombus in the distal portion of the main portal vein.  The amount of thrombus and degree of distal portal vein distension appears less prominent compared to the prior ultrasound in 12/2016.  EGD on 02/02/2017 revealed grade I esophageal varices and portal hypertensive gastropathy. The examined duodenum was normal.  EGD on 04/21/2018  revealed normal duodenal bulb and second portion of the duodenum. There was portal hypertensive gastropathy and non-bleeding gastric ulcers with a clean ulcer base.  There were recently bleeding large (> 5 mm) esophageal varices.  Colonoscopy on 02/02/2017 revealed one 3 mm polyp in the descending colon, one 10 mm polyp in the sigmoid colon, and four 5 to 6 mm polyps in the sigmoid colon. There was diverticulosis in the sigmoid colon and non-bleeding internal hemorrhoids. Pathology revealed hyperplastic polyp in the descending colon and a hyperplastic polyp, tubulovillous adenoma and 2 tubular adenomas in the sigmoid colon.   AFP has been followed:  3.3 on 06/05/2015, 1.8 on 01/19/2017, 2.9 on 10/12/2017, 1.7 on 04/20/2018, 1.4 on 04/21/2018, and 1.3 on 04/22/2018.  Symptomatically, he denies any complaints.  He denies any bleeding.  Exam reveals palpable splenomegaly.  Plan: 1.   Review labs from 01/01/2019. 2.   Iron deficiency anemia  Review entire medical history, diagnosis and management of iron deficiency anemia.  Etiology felt secondary to portal hypertensive gastropathy.  He is s/p variceal ligation x 1.  Hematocrit 35.7.  Hemoglobin 10.7.  MCV 79.9.  Ferritin 6 with an iron saturation of 5%.  Feraheme today and in 1 week. 3.   Thrombocytopenia  Platelet count 76,000.  Etiology felt secondary to underlying liver disease and splenomegaly.  Continue to monitor. 4.   Portal vein thrombosis  Nonocclusive portal vein thrombus documented in 06/2015.  Liver ultrasound in 04/2018 revealed persistent nonocclusive thrombus in the distal portion of the main portal vein.  No plans for anticoagulation given underlying cirrhosis and history of bleeding.  Continue to monitor. 5.   Cirrhosis  Etiology felt secondary to alcohol.  He has not had any alcohol since 2016.  Child class A.  Low MELD score.  Viral hepatitis panel was negative.  He requires hepatocellular screening every 6 months with  AFP and liver imaging (RUQ ultrasound).   RUQ ultrasound next available.  Patient is followed by Dr Marius Ditch. 6.   Health maintenance  Discuss low-dose chest CT program given 80-pack-year smoking history.   Patient interested.  Contact Melinda Crutch, RN. 7.   RTC in 3 months for labs (CBC with diff, ferritin, CMP, B12, folate, AFP). 8.   RTC in 6 months for MD assessment, labs (CBC with diff, ferritin- day before) ,  and +/- Feraheme.  I discussed the assessment and treatment plan with the patient.  The patient was provided an opportunity to ask questions and all were answered.  The patient agreed with the plan and demonstrated an understanding of the instructions.  The patient was advised to call back if the symptoms worsen or if the condition fails to improve as anticipated.  I provided 25 minutes of face-to-face time during this this encounter and > 50% was spent counseling as documented under my assessment and plan.    Lequita Asal, MD, PhD    01/02/2019, 1:27 PM  I, Molly Dorshimer, am acting as Education administrator for [employer]. Mike Gip, MD, PhD.  I, Melissa C. Mike Gip, MD, have reviewed the above documentation for accuracy and completeness, and I agree with the above.

## 2019-01-02 ENCOUNTER — Encounter: Payer: Self-pay | Admitting: Hematology and Oncology

## 2019-01-02 ENCOUNTER — Encounter: Payer: Self-pay | Admitting: Oncology

## 2019-01-02 ENCOUNTER — Inpatient Hospital Stay (HOSPITAL_BASED_OUTPATIENT_CLINIC_OR_DEPARTMENT_OTHER): Payer: 59 | Admitting: Hematology and Oncology

## 2019-01-02 ENCOUNTER — Other Ambulatory Visit: Payer: Self-pay | Admitting: Oncology

## 2019-01-02 ENCOUNTER — Inpatient Hospital Stay: Payer: 59

## 2019-01-02 VITALS — BP 150/80 | HR 68 | Resp 20

## 2019-01-02 VITALS — BP 137/87 | HR 74 | Temp 97.9°F | Ht 71.0 in | Wt 140.1 lb

## 2019-01-02 DIAGNOSIS — Z8249 Family history of ischemic heart disease and other diseases of the circulatory system: Secondary | ICD-10-CM

## 2019-01-02 DIAGNOSIS — K922 Gastrointestinal hemorrhage, unspecified: Secondary | ICD-10-CM | POA: Diagnosis not present

## 2019-01-02 DIAGNOSIS — K7030 Alcoholic cirrhosis of liver without ascites: Secondary | ICD-10-CM

## 2019-01-02 DIAGNOSIS — I81 Portal vein thrombosis: Secondary | ICD-10-CM

## 2019-01-02 DIAGNOSIS — Z79899 Other long term (current) drug therapy: Secondary | ICD-10-CM

## 2019-01-02 DIAGNOSIS — R5383 Other fatigue: Secondary | ICD-10-CM

## 2019-01-02 DIAGNOSIS — K766 Portal hypertension: Secondary | ICD-10-CM

## 2019-01-02 DIAGNOSIS — D696 Thrombocytopenia, unspecified: Secondary | ICD-10-CM | POA: Diagnosis not present

## 2019-01-02 DIAGNOSIS — D500 Iron deficiency anemia secondary to blood loss (chronic): Secondary | ICD-10-CM | POA: Diagnosis not present

## 2019-01-02 DIAGNOSIS — K648 Other hemorrhoids: Secondary | ICD-10-CM

## 2019-01-02 DIAGNOSIS — K3189 Other diseases of stomach and duodenum: Secondary | ICD-10-CM

## 2019-01-02 DIAGNOSIS — R162 Hepatomegaly with splenomegaly, not elsewhere classified: Secondary | ICD-10-CM

## 2019-01-02 DIAGNOSIS — I8510 Secondary esophageal varices without bleeding: Secondary | ICD-10-CM

## 2019-01-02 DIAGNOSIS — F17210 Nicotine dependence, cigarettes, uncomplicated: Secondary | ICD-10-CM

## 2019-01-02 MED ORDER — FERUMOXYTOL INJECTION 510 MG/17 ML
INTRAVENOUS | Status: AC
  Filled 2019-01-02: qty 17

## 2019-01-02 MED ORDER — SODIUM CHLORIDE 0.9 % IV SOLN
510.0000 mg | Freq: Once | INTRAVENOUS | Status: AC
  Administered 2019-01-02: 510 mg via INTRAVENOUS
  Filled 2019-01-02: qty 17

## 2019-01-02 MED ORDER — SODIUM CHLORIDE 0.9 % IV SOLN
Freq: Once | INTRAVENOUS | Status: AC
  Administered 2019-01-02: 14:00:00 via INTRAVENOUS
  Filled 2019-01-02: qty 250

## 2019-01-02 NOTE — Progress Notes (Signed)
No new changes noted today 

## 2019-01-02 NOTE — Patient Instructions (Signed)

## 2019-01-08 ENCOUNTER — Ambulatory Visit
Admission: RE | Admit: 2019-01-08 | Discharge: 2019-01-08 | Disposition: A | Discharge status: Home / Self Care | Payer: 59 | Source: Ambulatory Visit | Attending: Hematology and Oncology | Admitting: Hematology and Oncology

## 2019-01-08 ENCOUNTER — Other Ambulatory Visit: Payer: Self-pay

## 2019-01-08 DIAGNOSIS — K7030 Alcoholic cirrhosis of liver without ascites: Secondary | ICD-10-CM | POA: Diagnosis present

## 2019-01-09 ENCOUNTER — Other Ambulatory Visit: Payer: Self-pay | Admitting: Hematology and Oncology

## 2019-01-09 ENCOUNTER — Inpatient Hospital Stay: Payer: 59

## 2019-01-09 VITALS — BP 136/82 | HR 71 | Temp 98.1°F | Resp 20

## 2019-01-09 DIAGNOSIS — D500 Iron deficiency anemia secondary to blood loss (chronic): Secondary | ICD-10-CM

## 2019-01-09 MED ORDER — IRON SUCROSE 20 MG/ML IV SOLN
200.0000 mg | Freq: Once | INTRAVENOUS | Status: AC
  Administered 2019-01-09: 200 mg via INTRAVENOUS
  Filled 2019-01-09: qty 10

## 2019-01-09 MED ORDER — SODIUM CHLORIDE 0.9 % IV SOLN
Freq: Once | INTRAVENOUS | Status: AC
  Administered 2019-01-09: 14:00:00 via INTRAVENOUS
  Filled 2019-01-09: qty 250

## 2019-01-09 NOTE — Patient Instructions (Signed)
Iron Sucrose injection What is this medicine? IRON SUCROSE (AHY ern SOO krohs) is an iron complex. Iron is used to make healthy red blood cells, which carry oxygen and nutrients throughout the body. This medicine is used to treat iron deficiency anemia in people with chronic kidney disease. This medicine may be used for other purposes; ask your health care provider or pharmacist if you have questions. COMMON BRAND NAME(S): Venofer What should I tell my health care provider before I take this medicine? They need to know if you have any of these conditions: -anemia not caused by low iron levels -heart disease -high levels of iron in the blood -kidney disease -liver disease -an unusual or allergic reaction to iron, other medicines, foods, dyes, or preservatives -pregnant or trying to get pregnant -breast-feeding How should I use this medicine? This medicine is for infusion into a vein. It is given by a health care professional in a hospital or clinic setting. Talk to your pediatrician regarding the use of this medicine in children. While this drug may be prescribed for children as young as 2 years for selected conditions, precautions do apply. Overdosage: If you think you have taken too much of this medicine contact a poison control center or emergency room at once. NOTE: This medicine is only for you. Do not share this medicine with others. What if I miss a dose? It is important not to miss your dose. Call your doctor or health care professional if you are unable to keep an appointment. What may interact with this medicine? Do not take this medicine with any of the following medications: -deferoxamine -dimercaprol -other iron products This medicine may also interact with the following medications: -chloramphenicol -deferasirox This list may not describe all possible interactions. Give your health care provider a list of all the medicines, herbs, non-prescription drugs, or dietary  supplements you use. Also tell them if you smoke, drink alcohol, or use illegal drugs. Some items may interact with your medicine. What should I watch for while using this medicine? Visit your doctor or healthcare professional regularly. Tell your doctor or healthcare professional if your symptoms do not start to get better or if they get worse. You may need blood work done while you are taking this medicine. You may need to follow a special diet. Talk to your doctor. Foods that contain iron include: whole grains/cereals, dried fruits, beans, or peas, leafy green vegetables, and organ meats (liver, kidney). What side effects may I notice from receiving this medicine? Side effects that you should report to your doctor or health care professional as soon as possible: -allergic reactions like skin rash, itching or hives, swelling of the face, lips, or tongue -breathing problems -changes in blood pressure -cough -fast, irregular heartbeat -feeling faint or lightheaded, falls -fever or chills -flushing, sweating, or hot feelings -joint or muscle aches/pains -seizures -swelling of the ankles or feet -unusually weak or tired Side effects that usually do not require medical attention (report to your doctor or health care professional if they continue or are bothersome): -diarrhea -feeling achy -headache -irritation at site where injected -nausea, vomiting -stomach upset -tiredness This list may not describe all possible side effects. Call your doctor for medical advice about side effects. You may report side effects to FDA at 1-800-FDA-1088. Where should I keep my medicine? This drug is given in a hospital or clinic and will not be stored at home. NOTE: This sheet is a summary. It may not cover all possible information. If   you have questions about this medicine, talk to your doctor, pharmacist, or health care provider.  2019 Elsevier/Gold Standard (2011-04-28 17:14:35)  

## 2019-04-03 ENCOUNTER — Other Ambulatory Visit: Payer: Self-pay | Admitting: Hematology and Oncology

## 2019-04-03 ENCOUNTER — Telehealth: Payer: Self-pay

## 2019-04-03 ENCOUNTER — Inpatient Hospital Stay: Payer: 59 | Attending: Hematology and Oncology

## 2019-04-03 ENCOUNTER — Other Ambulatory Visit: Payer: Self-pay

## 2019-04-03 DIAGNOSIS — K7030 Alcoholic cirrhosis of liver without ascites: Secondary | ICD-10-CM | POA: Insufficient documentation

## 2019-04-03 DIAGNOSIS — F17210 Nicotine dependence, cigarettes, uncomplicated: Secondary | ICD-10-CM | POA: Insufficient documentation

## 2019-04-03 DIAGNOSIS — D500 Iron deficiency anemia secondary to blood loss (chronic): Secondary | ICD-10-CM

## 2019-04-03 DIAGNOSIS — R162 Hepatomegaly with splenomegaly, not elsewhere classified: Secondary | ICD-10-CM | POA: Insufficient documentation

## 2019-04-03 DIAGNOSIS — R634 Abnormal weight loss: Secondary | ICD-10-CM | POA: Insufficient documentation

## 2019-04-03 DIAGNOSIS — I10 Essential (primary) hypertension: Secondary | ICD-10-CM | POA: Insufficient documentation

## 2019-04-03 DIAGNOSIS — D696 Thrombocytopenia, unspecified: Secondary | ICD-10-CM

## 2019-04-03 DIAGNOSIS — Z79899 Other long term (current) drug therapy: Secondary | ICD-10-CM | POA: Insufficient documentation

## 2019-04-03 DIAGNOSIS — K766 Portal hypertension: Secondary | ICD-10-CM | POA: Insufficient documentation

## 2019-04-03 LAB — CBC WITH DIFFERENTIAL/PLATELET
Abs Immature Granulocytes: 0.02 10*3/uL (ref 0.00–0.07)
Basophils Absolute: 0.1 10*3/uL (ref 0.0–0.1)
Basophils Relative: 1 %
Eosinophils Absolute: 0.1 10*3/uL (ref 0.0–0.5)
Eosinophils Relative: 2 %
HCT: 25.9 % — ABNORMAL LOW (ref 39.0–52.0)
Hemoglobin: 7.9 g/dL — ABNORMAL LOW (ref 13.0–17.0)
Immature Granulocytes: 0 %
Lymphocytes Relative: 12 %
Lymphs Abs: 0.7 10*3/uL (ref 0.7–4.0)
MCH: 25.6 pg — ABNORMAL LOW (ref 26.0–34.0)
MCHC: 30.5 g/dL (ref 30.0–36.0)
MCV: 83.8 fL (ref 80.0–100.0)
Monocytes Absolute: 0.6 10*3/uL (ref 0.1–1.0)
Monocytes Relative: 10 %
Neutro Abs: 4.4 10*3/uL (ref 1.7–7.7)
Neutrophils Relative %: 75 %
Platelets: 77 10*3/uL — ABNORMAL LOW (ref 150–400)
RBC: 3.09 MIL/uL — ABNORMAL LOW (ref 4.22–5.81)
RDW: 19.5 % — ABNORMAL HIGH (ref 11.5–15.5)
WBC: 5.9 10*3/uL (ref 4.0–10.5)
nRBC: 0 % (ref 0.0–0.2)

## 2019-04-03 LAB — FERRITIN: Ferritin: 3 ng/mL — ABNORMAL LOW (ref 24–336)

## 2019-04-03 LAB — VITAMIN B12: Vitamin B-12: 233 pg/mL (ref 180–914)

## 2019-04-03 LAB — COMPREHENSIVE METABOLIC PANEL
ALT: 10 U/L (ref 0–44)
AST: 14 U/L — ABNORMAL LOW (ref 15–41)
Albumin: 3.4 g/dL — ABNORMAL LOW (ref 3.5–5.0)
Alkaline Phosphatase: 62 U/L (ref 38–126)
Anion gap: 8 (ref 5–15)
BUN: 13 mg/dL (ref 6–20)
CO2: 19 mmol/L — ABNORMAL LOW (ref 22–32)
Calcium: 8.2 mg/dL — ABNORMAL LOW (ref 8.9–10.3)
Chloride: 109 mmol/L (ref 98–111)
Creatinine, Ser: 0.54 mg/dL — ABNORMAL LOW (ref 0.61–1.24)
GFR calc Af Amer: >60 mL/min (ref >60)
GFR calc non Af Amer: >60 mL/min (ref >60)
Glucose, Bld: 120 mg/dL — ABNORMAL HIGH (ref 70–99)
Potassium: 3.8 mmol/L (ref 3.5–5.1)
Sodium: 136 mmol/L (ref 135–145)
Total Bilirubin: 0.3 mg/dL (ref 0.3–1.2)
Total Protein: 6.1 g/dL — ABNORMAL LOW (ref 6.5–8.1)

## 2019-04-03 LAB — FOLATE: Folate: 15 ng/mL (ref >5.9)

## 2019-04-03 NOTE — Telephone Encounter (Signed)
-----   Message from Lequita Asal, MD sent at 04/03/2019  2:25 PM EDT ----- Regarding: Please call patient  Hematocrit has dropped from 35 to 25 since 12/2018.  Await ferritin.  Any symptoms.  He will likely need Feraheme tomorrow.  M  ----- Message ----- From: Buel Ream, Lab In [place] Sent: 04/03/2019   1:38 PM EDT To: Lequita Asal, MD

## 2019-04-03 NOTE — Telephone Encounter (Signed)
The patient states he doe not want to go to the ED at this time. He states he does not feel a need to go. I have informed the patient if he see any blood loss, like rectal bleeding or nose bleeding, please go to the ED due to he is only at 7.6 with his hbg levels.

## 2019-04-03 NOTE — Telephone Encounter (Signed)
Spoke with the patient to inform him of  his lab values and to see if he is having any bleeding the patient reply with no. He has not had any rectal bleeding or nose bleeding. He states he has had black stool but he do take Ferrous sulfate daily. The patient does states if he get up to fast he has been a little light headed. But over all he has been fine. I have informed the patient that Dr Mike Gip would like for him to come in for IV Iron and he was agreeable and understanding to getting the treatments. I have verified with Erasmo Downer and Sharyn Lull to see when the will have a opening to have the patient to come in and get Iron Infusion. The patient will be schedule for Friday to get Iron Infusion.

## 2019-04-03 NOTE — Telephone Encounter (Signed)
Spoke with the patient to inform him Per dr Mike Gip he need to be seen in [facility] for assesment to make sure he don't need a blood transfusion. The patient ferritin has dropped to 3 and hbg has dropped to 7.6. The patient denies any GI bleeding but does report black stool with normal a smell. The patient will be seen in [facility] 04/04/2019 at 10:15 and get Iron transfusion at 11:00 AM. The patient was understanding and agreeable.

## 2019-04-04 ENCOUNTER — Inpatient Hospital Stay
Admission: AD | Admit: 2019-04-04 | Discharge: 2019-04-07 | DRG: 441 | Disposition: A | Discharge status: Home / Self Care | Payer: 59 | Source: Ambulatory Visit | Attending: Internal Medicine | Admitting: Internal Medicine

## 2019-04-04 ENCOUNTER — Inpatient Hospital Stay: Payer: 59

## 2019-04-04 ENCOUNTER — Inpatient Hospital Stay (HOSPITAL_BASED_OUTPATIENT_CLINIC_OR_DEPARTMENT_OTHER): Payer: 59 | Admitting: Nurse Practitioner

## 2019-04-04 ENCOUNTER — Encounter: Payer: Self-pay | Admitting: Nurse Practitioner

## 2019-04-04 ENCOUNTER — Other Ambulatory Visit: Payer: Self-pay

## 2019-04-04 VITALS — BP 122/79 | HR 66 | Temp 98.2°F | Resp 16 | Wt 131.9 lb

## 2019-04-04 VITALS — BP 118/66 | HR 58 | Temp 97.0°F | Resp 17

## 2019-04-04 DIAGNOSIS — K922 Gastrointestinal hemorrhage, unspecified: Secondary | ICD-10-CM | POA: Diagnosis present

## 2019-04-04 DIAGNOSIS — K269 Duodenal ulcer, unspecified as acute or chronic, without hemorrhage or perforation: Secondary | ICD-10-CM | POA: Diagnosis present

## 2019-04-04 DIAGNOSIS — K921 Melena: Secondary | ICD-10-CM | POA: Diagnosis present

## 2019-04-04 DIAGNOSIS — Z8249 Family history of ischemic heart disease and other diseases of the circulatory system: Secondary | ICD-10-CM

## 2019-04-04 DIAGNOSIS — Z8711 Personal history of peptic ulcer disease: Secondary | ICD-10-CM

## 2019-04-04 DIAGNOSIS — Z86718 Personal history of other venous thrombosis and embolism: Secondary | ICD-10-CM

## 2019-04-04 DIAGNOSIS — K219 Gastro-esophageal reflux disease without esophagitis: Secondary | ICD-10-CM | POA: Diagnosis present

## 2019-04-04 DIAGNOSIS — K259 Gastric ulcer, unspecified as acute or chronic, without hemorrhage or perforation: Secondary | ICD-10-CM | POA: Diagnosis present

## 2019-04-04 DIAGNOSIS — K766 Portal hypertension: Principal | ICD-10-CM | POA: Diagnosis present

## 2019-04-04 DIAGNOSIS — I10 Essential (primary) hypertension: Secondary | ICD-10-CM | POA: Diagnosis present

## 2019-04-04 DIAGNOSIS — D649 Anemia, unspecified: Secondary | ICD-10-CM

## 2019-04-04 DIAGNOSIS — D509 Iron deficiency anemia, unspecified: Secondary | ICD-10-CM | POA: Diagnosis present

## 2019-04-04 DIAGNOSIS — K253 Acute gastric ulcer without hemorrhage or perforation: Secondary | ICD-10-CM

## 2019-04-04 DIAGNOSIS — D6959 Other secondary thrombocytopenia: Secondary | ICD-10-CM | POA: Diagnosis present

## 2019-04-04 DIAGNOSIS — D62 Acute posthemorrhagic anemia: Secondary | ICD-10-CM | POA: Diagnosis present

## 2019-04-04 DIAGNOSIS — Z86010 Personal history of colonic polyps: Secondary | ICD-10-CM | POA: Diagnosis not present

## 2019-04-04 DIAGNOSIS — D500 Iron deficiency anemia secondary to blood loss (chronic): Secondary | ICD-10-CM

## 2019-04-04 DIAGNOSIS — I8511 Secondary esophageal varices with bleeding: Secondary | ICD-10-CM

## 2019-04-04 DIAGNOSIS — Z20828 Contact with and (suspected) exposure to other viral communicable diseases: Secondary | ICD-10-CM | POA: Diagnosis present

## 2019-04-04 DIAGNOSIS — Z888 Allergy status to other drugs, medicaments and biological substances status: Secondary | ICD-10-CM

## 2019-04-04 DIAGNOSIS — K7030 Alcoholic cirrhosis of liver without ascites: Secondary | ICD-10-CM | POA: Diagnosis present

## 2019-04-04 DIAGNOSIS — F17210 Nicotine dependence, cigarettes, uncomplicated: Secondary | ICD-10-CM | POA: Diagnosis present

## 2019-04-04 DIAGNOSIS — Z79899 Other long term (current) drug therapy: Secondary | ICD-10-CM

## 2019-04-04 DIAGNOSIS — Z7982 Long term (current) use of aspirin: Secondary | ICD-10-CM | POA: Diagnosis not present

## 2019-04-04 DIAGNOSIS — K2920 Alcoholic gastritis without bleeding: Secondary | ICD-10-CM | POA: Diagnosis present

## 2019-04-04 DIAGNOSIS — D696 Thrombocytopenia, unspecified: Secondary | ICD-10-CM | POA: Diagnosis not present

## 2019-04-04 DIAGNOSIS — Z716 Tobacco abuse counseling: Secondary | ICD-10-CM | POA: Diagnosis not present

## 2019-04-04 DIAGNOSIS — K3189 Other diseases of stomach and duodenum: Secondary | ICD-10-CM | POA: Diagnosis present

## 2019-04-04 LAB — COMPREHENSIVE METABOLIC PANEL
ALT: 11 U/L (ref 0–44)
AST: 14 U/L — ABNORMAL LOW (ref 15–41)
Albumin: 3.4 g/dL — ABNORMAL LOW (ref 3.5–5.0)
Alkaline Phosphatase: 58 U/L (ref 38–126)
Anion gap: 9 (ref 5–15)
BUN: 15 mg/dL (ref 6–20)
CO2: 22 mmol/L (ref 22–32)
Calcium: 8.5 mg/dL — ABNORMAL LOW (ref 8.9–10.3)
Chloride: 110 mmol/L (ref 98–111)
Creatinine, Ser: 0.58 mg/dL — ABNORMAL LOW (ref 0.61–1.24)
GFR calc Af Amer: >60 mL/min (ref >60)
GFR calc non Af Amer: >60 mL/min (ref >60)
Glucose, Bld: 88 mg/dL (ref 70–99)
Potassium: 3.6 mmol/L (ref 3.5–5.1)
Sodium: 141 mmol/L (ref 135–145)
Total Bilirubin: 0.4 mg/dL (ref 0.3–1.2)
Total Protein: 5.9 g/dL — ABNORMAL LOW (ref 6.5–8.1)

## 2019-04-04 LAB — CBC WITH DIFFERENTIAL/PLATELET
Abs Immature Granulocytes: 0.01 10*3/uL (ref 0.00–0.07)
Basophils Absolute: 0 10*3/uL (ref 0.0–0.1)
Basophils Relative: 1 %
Eosinophils Absolute: 0 10*3/uL (ref 0.0–0.5)
Eosinophils Relative: 1 %
HCT: 25.7 % — ABNORMAL LOW (ref 39.0–52.0)
Hemoglobin: 7.6 g/dL — ABNORMAL LOW (ref 13.0–17.0)
Immature Granulocytes: 0 %
Lymphocytes Relative: 14 %
Lymphs Abs: 0.6 10*3/uL — ABNORMAL LOW (ref 0.7–4.0)
MCH: 25 pg — ABNORMAL LOW (ref 26.0–34.0)
MCHC: 29.6 g/dL — ABNORMAL LOW (ref 30.0–36.0)
MCV: 84.5 fL (ref 80.0–100.0)
Monocytes Absolute: 0.5 10*3/uL (ref 0.1–1.0)
Monocytes Relative: 11 %
Neutro Abs: 2.9 10*3/uL (ref 1.7–7.7)
Neutrophils Relative %: 73 %
Platelets: 66 10*3/uL — ABNORMAL LOW (ref 150–400)
RBC: 3.04 MIL/uL — ABNORMAL LOW (ref 4.22–5.81)
RDW: 19.2 % — ABNORMAL HIGH (ref 11.5–15.5)
WBC: 4 10*3/uL (ref 4.0–10.5)
nRBC: 0 % (ref 0.0–0.2)

## 2019-04-04 LAB — PROTIME-INR
INR: 1.2 (ref 0.8–1.2)
Prothrombin Time: 14.9 seconds (ref 11.4–15.2)

## 2019-04-04 MED ORDER — POLYSACCHARIDE IRON COMPLEX 150 MG PO CAPS
150.0000 mg | ORAL_CAPSULE | Freq: Every day | ORAL | Status: DC
  Administered 2019-04-04 – 2019-04-07 (×3): 150 mg via ORAL
  Filled 2019-04-04 (×4): qty 1

## 2019-04-04 MED ORDER — SODIUM CHLORIDE 0.9 % IV SOLN
50.0000 ug/h | INTRAVENOUS | Status: AC
  Administered 2019-04-04 – 2019-04-07 (×6): 50 ug/h via INTRAVENOUS
  Filled 2019-04-04 (×9): qty 1

## 2019-04-04 MED ORDER — SODIUM CHLORIDE 0.9 % IV SOLN: 200.0000 mg | Freq: Once | INTRAVENOUS | Status: DC

## 2019-04-04 MED ORDER — LORATADINE 10 MG PO TABS
10.0000 mg | ORAL_TABLET | Freq: Every day | ORAL | Status: DC
  Administered 2019-04-04 – 2019-04-07 (×3): 10 mg via ORAL
  Filled 2019-04-04 (×3): qty 1

## 2019-04-04 MED ORDER — SODIUM CHLORIDE 0.9 % IV SOLN
Freq: Once | INTRAVENOUS | Status: AC
  Administered 2019-04-04: 11:00:00 via INTRAVENOUS
  Filled 2019-04-04: qty 250

## 2019-04-04 MED ORDER — PANTOPRAZOLE SODIUM 40 MG IV SOLR: 40.0000 mg | Freq: Two times a day (BID) | INTRAVENOUS | Status: DC

## 2019-04-04 MED ORDER — SODIUM CHLORIDE 0.9 % IV SOLN
INTRAVENOUS | Status: DC
  Administered 2019-04-04 – 2019-04-06 (×2): via INTRAVENOUS

## 2019-04-04 MED ORDER — SODIUM CHLORIDE 0.9 % IV SOLN
1.0000 g | Freq: Every day | INTRAVENOUS | Status: DC
  Administered 2019-04-04 – 2019-04-06 (×3): 1 g via INTRAVENOUS
  Filled 2019-04-04 (×3): qty 1
  Filled 2019-04-04: qty 10

## 2019-04-04 MED ORDER — IRON SUCROSE 20 MG/ML IV SOLN
200.0000 mg | Freq: Once | INTRAVENOUS | Status: AC
  Administered 2019-04-04: 200 mg via INTRAVENOUS
  Filled 2019-04-04: qty 10

## 2019-04-04 MED ORDER — SODIUM CHLORIDE 0.9 % IV SOLN
80.0000 mg | Freq: Once | INTRAVENOUS | Status: AC
  Administered 2019-04-04: 15:00:00 80 mg via INTRAVENOUS
  Filled 2019-04-04: qty 80

## 2019-04-04 MED ORDER — SODIUM CHLORIDE 0.9 % IV SOLN
8.0000 mg/h | INTRAVENOUS | Status: AC
  Administered 2019-04-04 – 2019-04-07 (×6): 8 mg/h via INTRAVENOUS
  Filled 2019-04-04 (×6): qty 80

## 2019-04-04 NOTE — Consult Note (Signed)
Vonda Antigua, MD [address] [place], [place], [place], [address] [id number], [place], [place], [place], [address] Phone: [phone]  Fax: [phone]  Consultation  Referring Provider:     Rufina Falco Primary Care Physician:  [name], DO Reason for Consultation:     Mel[place]  Date of Admission:  04/04/2019 Date of Consultation:  04/04/2019         HPI:   Manuel Jimenez is a 57 y.o. male with history of alcoholic cirrhosis, iron deficiency anemia, previously seen by Dr. Marius Ditch.  Last procedure showed grade 1 esophageal varices with red wale sign, with banding x1, clean base gastric ulcers, portal hypertensive gastropathy, in September 2019, now admitted with reports of dark stools at home, and acute on chronic anemia. Has been taking OTC aspirin as needed. Last use was a week ago, states takes about 2-4 pills on a day he takes them. Also started taking his oral Iron tablets one month ago. No emesis, no hematemesis.  Has a history of chronic portal vein thrombosis, portal hypertensive gastropathy.  Was Also started on propanolol by Dr. Marius Ditch for varices as well but does not take it.   Remains abstinent of alcohol since 2016  Past Medical History:  Diagnosis Date  . Anemia   . Cancer ([place])   . Cirrhosis ([place])   . Colon polyps   . Constipation   . Diarrhea   . Hemorrhoids   . Hypertension    in past  . Portal vein thrombosis    see chart review 06/05/15  . Shingles 09/2016  . Stomach ulcer   . Weight loss     Past Surgical History:  Procedure Laterality Date  . COLONOSCOPY N/A 06/17/2015   Procedure: COLONOSCOPY;  Surgeon: Hulen Luster, MD;  Location: [place];  Service: Gastroenterology;  Laterality: N/A;  . COLONOSCOPY WITH PROPOFOL N/A 02/02/2017   Procedure: COLONOSCOPY WITH PROPOFOL;  Surgeon: [name], MD;  Location: [place];  Service: Endoscopy;  Laterality: N/A;  . ENTEROSCOPY N/A 04/21/2018   Procedure:  ENTEROSCOPY;  Surgeon: Lin Landsman, MD;  Location: [facility] ENDOSCOPY;  Service: Gastroenterology;  Laterality: N/A;  . ESOPHAGOGASTRODUODENOSCOPY N/A 05/14/2015   Procedure: ESOPHAGOGASTRODUODENOSCOPY (EGD);  Surgeon: Hulen Luster, MD;  Location[facility] ENDOSCOPY;  Service: Endoscopy;  Laterality: N/A;  . ESOPHAGOGASTRODUODENOSCOPY N/A 04/21/2018   Procedure: ESOPHAGOGASTRODUODENOSCOPY (EGD);  Surgeon: Lin Landsman, MD;  Location: [facility] ENDOSCOPY;  Service: Gastroenterology;  Laterality: N/A;  . ESOPHAGOGASTRODUODENOSCOPY (EGD) WITH PROPOFOL N/A 02/02/2017   Procedure: ESOPHAGOGASTRODUODENOSCOPY (EGD) WITH PROPOFOL;  Surgeon: [name], MD;  Location: [place];  Service: Endoscopy;  Laterality: N/A;  . POLYPECTOMY  02/02/2017   Procedure: POLYPECTOMY;  Surgeon: [name], MD;  Location: [place];  Service: Endoscopy;;    Prior to Admission medications   Medication Sig Start Date End Date Taking? Authorizing Provider  aspirin 325 MG tablet Take 325 mg by mouth daily. 2-4 per day    [provider]  FERREX 150 150 MG capsule TAKE 1 CAPSULE BY MOUTH EVERY DAY 04/23/18   Verlon Au, NP  loratadine (CLARITIN) 10 MG tablet Take 10 mg by mouth daily.    [provider]  propranolol (INDERAL) 20 MG tablet TAKE 1 TABLET BY MOUTH TWICE A DAY Patient not taking: Reported on 04/04/2019 06/22/18   Lin Landsman, MD    Family History  Problem Relation Age of Onset  . CAD Father   .  Heart attack Father      Social History   Tobacco Use  . Smoking status: Current Every Day Smoker    Packs/day: 2.00    Years: 40.00    Pack years: 80.00    Types: Cigarettes  . Smokeless tobacco: Never Used  . Tobacco comment: Only successfully quit for 1 month before due to acute illness. (01/24/17 - down to 1.5 PPD)  Substance Use Topics  . Alcohol use: No    Comment: Former alcohol abuse- sober since 2016  . Drug use: No    Allergies as of 04/04/2019 -  Review Complete 04/04/2019  Allergen Reaction Noted  . Lorazepam Other (See Comments) 04/06/2016  . Iron Nausea Only 01/24/2017    Review of Systems:    All systems reviewed and negative except where noted in HPI.   Physical Exam:  Vital signs in last 24 hours: Vitals:   04/04/19 1336 04/04/19 1405 04/04/19 1406  BP: 112/77    Pulse: 67    Resp: 16    Temp: 99.4 F (37.4 C)    TempSrc: Oral    SpO2: 98%    Weight:   59.8 kg  Height:  5\' 11"  (1.803 m)    Last BM Date: 04/04/19(dark stool per pt) General:   Pleasant, cooperative in NAD Head:  Normocephalic and atraumatic. Eyes:   No icterus.   Conjunctiva pink. PERRLA. Ears:  Normal auditory acuity. Neck:  Supple; no masses or thyroidomegaly Lungs: Respirations even and unlabored. Lungs clear to auscultation bilaterally.   No wheezes, crackles, or rhonchi.  Abdomen:  Soft, nondistended, nontender. Normal bowel sounds. No appreciable masses or hepatomegaly.  No rebound or guarding.  Neurologic:  Alert and oriented x3;  grossly normal neurologically. Skin:  Intact without significant lesions or rashes. Cervical Nodes:  No significant cervical adenopathy. Psych:  Alert and cooperative. Normal affect.  LAB RESULTS: Recent Labs    04/03/19 1320 04/04/19 1348  WBC 5.9 4.0  HGB 7.9* 7.6*  HCT 25.9* 25.7*  PLT 77* 66*   BMET Recent Labs    04/03/19 1320 04/04/19 1348  NA 136 141  K 3.8 3.6  CL 109 110  CO2 19* 22  GLUCOSE 120* 88  BUN 13 15  CREATININE 0.54* 0.58*  CALCIUM 8.2* 8.5*   LFT Recent Labs    04/04/19 1348  PROT 5.9*  ALBUMIN 3.4*  AST 14*  ALT 11  ALKPHOS 58  BILITOT 0.4   PT/INR No results for input(s): LABPROT, INR in the last 72 hours.  STUDIES: No results found.    Impression / Plan:   Manuel Jimenez is a 58 y.o. y/o male with alcoholic cirrhosis, abstinent since 2016, with acute on chronic anemia, with reports of dark stool at home for the last 2 weeks with oral iron use for the  last month and as needed aspirin about once a week  Given that patient has not had any emesis and symptoms have been ongoing for the last week, this may not be a variceal bleed.  Variceal bleeds are expected to have more acute changes in vital signs and more acute bleeding such as hematemesis instead of the slow process ongoing for a week that patient has had.  His black stool started after iron intake and maybe from iron use as well.   He also was found to have clean-based gastric ulcers on previous endoscopy and portal hypertensive gastropathy and has had intermittent NSAID use so differentials also include PUD and  Portal HTN gastropathy  He will require further evaluation with endoscopy after medical optimization  PPI IV twice daily  Octreotide drip Gram-negative coverage due to history of cirrhosis and now GI bleed  Continue serial CBCs and transfuse PRN Avoid NSAIDs Maintain 2 large-bore IV lines Please page GI with any acute hemodynamic changes, or signs of active GI bleeding  EGD with possible Flex Sig (If EGD negative) to evaluate etiology of symptoms  Continue serial CBCs and transfuse PRN Avoid NSAIDs Maintain 2 large-bore IV lines Please page GI with any acute hemodynamic changes, or signs of active GI bleeding  I have discussed alternative options, risks & benefits,  which include, but are not limited to, bleeding, infection, perforation,respiratory complication & drug reaction.  The patient agrees with this plan & written consent will be obtained.     Thank you for involving me in the care of this patient.      LOS: 0 days   Virgel Manifold, MD  04/04/2019, 3:54 PM

## 2019-04-04 NOTE — H&P (Signed)
[facility] NAME: Manuel Jimenez    MR#:  [id number]  DATE OF BIRTH:  [birth year]  DATE OF ADMISSION:  04/04/2019  PRIMARY CARE PHYSICIAN: [name], DO   REQUESTING/REFERRING PHYSICIAN: Beckey Rutter, NP  CHIEF COMPLAINT:   GI bleed  HISTORY OF PRESENT ILLNESS:   58 year old male with past medical history of iron deficiency anemia secondary to GI bleed, thrombocytopenia, liver mass, hypertension,portal vein thrombosis, liver cirrhosis, EtOH abuse, alcoholic gastritis, portal hypertensive gastropathy, and esophageal varices banded presenting as direct admit from the cancer center with GI bleed.  Patient was seen at the cancer center today for symptomatic management of anemia.  Per oncology notes, patient had labs drawn at [facility] clinic in anticipation of seeing Dr. Mike Gip next week.  During that visit he reported black stool to the nursing staff.  His labs on 6/2 revealed hemoglobin of 10.7 and platelets 76 with a baseline CMP.  He had repeat labs yesterday which showed hemoglobin 7.9 significant drop from 10.7 in just a day.  Patient's  significant other reports that patient has been taking aspirin over the past few days for aches and pains.  He also report black tarry stools for the past 2 to 3 weeks and thought was likely related to iron supplements that he is taking for his iron deficiency anemia.  He denies nausea or vomiting, shortness of breath, chest pain or palpitation, diarrhea, fevers or chills or any other associated GI symptoms.  Given his drop in hemoglobin concerning for possible GI bleed hospitalist were contacted to admit for further evaluation.  On arrival to the unit, he was afebrile with blood pressure 118/66 mm Hg and pulse rate 58/ beats/min. There were no focal neurological deficits; he was alert and oriented x 4.  Patient significant other currently at the bedside.  PAST MEDICAL HISTORY:   Past  Medical History:  Diagnosis Date  . Anemia   . Cancer ([place])   . Cirrhosis ([place])   . Colon polyps   . Constipation   . Diarrhea   . Hemorrhoids   . Hypertension    in past  . Portal vein thrombosis    see chart review 06/05/15  . Shingles 09/2016  . Stomach ulcer   . Weight loss     PAST SURGICAL HISTORY:   Past Surgical History:  Procedure Laterality Date  . COLONOSCOPY N/A 06/17/2015   Procedure: COLONOSCOPY;  Surgeon: Hulen Luster, MD;  Location: [place];  Service: Gastroenterology;  Laterality: N/A;  . COLONOSCOPY WITH PROPOFOL N/A 02/02/2017   Procedure: COLONOSCOPY WITH PROPOFOL;  Surgeon: [name], MD;  Location: [place];  Service: Endoscopy;  Laterality: N/A;  . ENTEROSCOPY N/A 04/21/2018   Procedure: ENTEROSCOPY;  Surgeon: Lin Landsman, MD;  Location: [facility] ENDOSCOPY;  Service: Gastroenterology;  Laterality: N/A;  . ESOPHAGOGASTRODUODENOSCOPY N/A 05/14/2015   Procedure: ESOPHAGOGASTRODUODENOSCOPY (EGD);  Surgeon: Hulen Luster, MD;  Location: [facility] ENDOSCOPY;  Service: Endoscopy;  Laterality: N/A;  . ESOPHAGOGASTRODUODENOSCOPY N/A 04/21/2018   Procedure: ESOPHAGOGASTRODUODENOSCOPY (EGD);  Surgeon: Lin Landsman, MD;  Location: [facility] ENDOSCOPY;  Service: Gastroenterology;  Laterality: N/A;  . ESOPHAGOGASTRODUODENOSCOPY (EGD) WITH PROPOFOL N/A 02/02/2017   Procedure: ESOPHAGOGASTRODUODENOSCOPY (EGD) WITH PROPOFOL;  Surgeon: [name], MD;  Location: [place];  Service: Endoscopy;  Laterality: N/A;  . POLYPECTOMY  02/02/2017   Procedure: POLYPECTOMY;  Surgeon: [name], MD;  Location: [place];  Service: Endoscopy;;  SOCIAL HISTORY:   Social History   Tobacco Use  . Smoking status: Current Every Day Smoker    Packs/day: 2.00    Years: 40.00    Pack years: 80.00    Types: Cigarettes  . Smokeless tobacco: Never Used  . Tobacco comment: Only successfully quit for 1 month before due to acute illness. (01/24/17 -  down to 1.5 PPD)  Substance Use Topics  . Alcohol use: No    Comment: Former alcohol abuse- sober since 2016    FAMILY HISTORY:   Family History  Problem Relation Age of Onset  . CAD Father   . Heart attack Father     DRUG ALLERGIES:   Allergies  Allergen Reactions  . Lorazepam Other (See Comments)    "made me anxious and mean"   . Iron Nausea Only    REVIEW OF SYSTEMS:   Review of Systems  Constitutional: Positive for weight loss. Negative for chills, fever and malaise/fatigue.  HENT: Negative for congestion, hearing loss and sore throat.   Eyes: Negative for blurred vision and double vision.  Respiratory: Positive for cough. Negative for shortness of breath and wheezing.   Cardiovascular: Negative for chest pain, palpitations, orthopnea and leg swelling.  Gastrointestinal: Positive for melena. Negative for abdominal pain, diarrhea, nausea and vomiting.  Genitourinary: Negative for dysuria and urgency.  Musculoskeletal: Negative for myalgias.  Skin: Negative for rash.  Neurological: Negative for dizziness, sensory change, speech change, focal weakness and headaches.  Psychiatric/Behavioral: Negative for depression.   MEDICATIONS AT HOME:   Prior to Admission medications   Medication Sig Start Date End Date Taking? Authorizing Provider  aspirin 325 MG tablet Take 325 mg by mouth daily. 2-4 per day    [provider]  FERREX 150 150 MG capsule TAKE 1 CAPSULE BY MOUTH EVERY DAY 04/23/18   Verlon Au, NP  loratadine (CLARITIN) 10 MG tablet Take 10 mg by mouth daily.    [provider]  propranolol (INDERAL) 20 MG tablet TAKE 1 TABLET BY MOUTH TWICE A DAY Patient not taking: Reported on 04/04/2019 06/22/18   Lin Landsman, MD      VITAL SIGNS:  Blood pressure 112/77, pulse 67, temperature 99.4 F (37.4 C), temperature source Oral, resp. rate 16, SpO2 98 %.  PHYSICAL EXAMINATION:   Physical Exam  GENERAL:  58 y.o.-year-old patient lying  in the bed with no acute distress.  EYES: Pupils equal, round, reactive to light and accommodation. No scleral icterus. Extraocular muscles intact.  HEENT: Head atraumatic, normocephalic. Oropharynx and nasopharynx clear.  NECK:  Supple, no jugular venous distention. No thyroid enlargement, no tenderness.  LUNGS: Normal breath sounds bilaterally, no wheezing, rales,rhonchi or crepitation. No use of accessory muscles of respiration.  CARDIOVASCULAR: S1, S2 normal. No murmurs, rubs, or gallops.  ABDOMEN: Soft, nontender, nondistended. Bowel sounds present. No organomegaly or mass.  EXTREMITIES: No pedal edema, cyanosis, or clubbing. No rash or lesions. + pedal pulses MUSCULOSKELETAL: Normal bulk, and power was 5+ grip and elbow, knee, and ankle flexion and extension bilaterally.  NEUROLOGIC:Alert and oriented x 3. CN 2-12 intact. Sensation to light touch and cold stimuli intact bilaterally. Gait not tested due to safety concern. PSYCHIATRIC: The patient is alert and oriented x 3.  SKIN: No obvious rash, lesion, or ulcer.   DATA REVIEWED:  LABORATORY PANEL:   CBC Recent Labs  Lab 04/03/19 1320  WBC 5.9  HGB 7.9*  HCT 25.9*  PLT 77*   ------------------------------------------------------------------------------------------------------------------  Chemistries  Recent Labs  Lab 04/03/19 1320  NA 136  K 3.8  CL 109  CO2 19*  GLUCOSE 120*  BUN 13  CREATININE 0.54*  CALCIUM 8.2*  AST 14*  ALT 10  ALKPHOS 62  BILITOT 0.3   ------------------------------------------------------------------------------------------------------------------  Cardiac Enzymes No results for input(s): TROPONINI in the last 168 hours. ------------------------------------------------------------------------------------------------------------------  RADIOLOGY:  No results found.  EKG:  EKG: there are no previous tracings available for comparison.  IMPRESSION AND PLAN:   58 y.o. male with past  medical history of iron deficiency anemia secondary to GI bleed, thrombocytopenia, liver mass, hypertension,portal vein thrombosis, liver cirrhosis, EtOH abuse, alcoholic gastritis, portal hypertensive gastropathy, and esophageal varices banded presenting as direct admit from the cancer center with GI bleed.  1.  GI Bleed - Presenting with melena x 2-3 weeks with drop in hgb from 10.7 to 7.9 in one day without evidence hemodynamic instability - Admit to MedSurg unit - IVFs to maintain MAP>65 - H&H monitoring q6h - Blood Consent. Transfuse PRN Hgb<7 - Pantoprazole 80mg  IV x1 then gtt 8mg /hr - Start octreotide gtt - NPO for pending endoscopy - GI Consult for EGD/Colonoscopy - Check CBC now - Hold NSAIDs, steroids, ASA  2. Iron deficiency anemia - Following with hematology oncologist - Hgb stable at 10.1.   Receiving Venofer at the cancer center.  Iron saturation 5%, ferritin 3  3. Liver cirrhosis -patient with a history of portal vein thrombosis and esophageal varices banded -Recheck CMP now -Gram-negative coverage with ceftriaxone due to his cirrhosis and GI bleed  4.Thrombocytopenia likely secondary to cirrhosis - continue to monitor for active bleed and infection  5. Alcoholic gastritis/ulcer - Continue Protonix drip as above  6. DVT prophylaxis - Hold anti-coagulation for thrombocytopenia / pending procedure / active bleed /    All the records are reviewed and case discussed with ED provider. Management plans discussed with the patient, family and they are in agreement.  CODE STATUS: FULL  TOTAL TIME TAKING CARE OF THIS PATIENT: 50 minutes.    on 04/04/2019 at 1:40 PM   Rufina Falco, DNP, FNP-BC [facility] Nurse Practitioner Between 7am to 6pm - Pager [phone]  After 6pm go to [email or web address] - password [email or web address] [facility]  Office  [phone]  CC: Primary care physician; [name], DO

## 2019-04-04 NOTE — Progress Notes (Signed)
1120: Pt tolerated infusion well. Pt and VS stable. 1200: Per Beckey Rutter NP pt is waiting on a bed assignment to be admitted inpatient. Per Beckey Rutter NP per Dr. [name] okay for pt to return home and wait for bed assignment. Pt educated to have phone on him and Beckey Rutter NP will call when bed is ready, (Pt verbally gives best contact number as [phone]). Pt verbalizes understanding and agrees with plan. Pt stable at discharge.

## 2019-04-04 NOTE — Progress Notes (Signed)
[facility]pt[facility]  Telephone:([phone] [phone]  Patient Care Team: [name], DO as PCP - General (Family Medicine) Lloyd Huger, MD as Consulting Physician (Hematology and Oncology)   Name of the patient: Manuel Jimenez  [id number]  [birth year]   Date of visit: 04/04/19  Diagnosis- Iron Deficiency Anemia  Chief complaint/ Reason for visit- low hemoglobin  Heme History:    Interval history-Manuel Jimenez, 58 year old male with history of iron deficiency anemia, cirrhosis, varices, smoking, presents to symptom management clinic for low blood counts.  He was last seen by Dr. Mike Gip on 01/02/2019.  He says he feels at baseline and denies specific complaints.  He has chronic black stools which he feels is secondary to oral iron.  No clots.  He has taken some aspirin over the past few days due to aches and pains.  No nausea, heartburn, dysphagia, early satiety, vomiting, shortness of breath, or fatigue.  No chest pain or palpitations.  No abdominal pain.  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  He has not seen GI since September 2019.  He says he had labs drawn at [facility] clinic in anticipation of seeing Dr. Mike Gip next week and he reported black stools to nursing. Given drop in his blood counts they were concerned for GI bleed and encouraged him to go to the ER.  He said he felt well and declined and presents to symptom management for evaluation today.  01/01/2019 Hemoglobin-10.7 Hematocrit 35.7 Ferritin 6  04/03/2019 Hemoglobin 7.9 Hematocrit 25.9 Ferritin 3  Upper endoscopy on 04/21/2018 which showed mild diffuse portal hypertensive gastropathy.  Few nonbleeding superficial gastric ulcers found in the gastric antrum, largest lesion was 5 mm.  2 columns of nonbleeding large (> 5 mm) varices were found in the lower third of the  esophagus with stigmata of recent bleeding; 1 band was placed with complete eradication.  1 nonbleeding superficial duodenal ulcer and duodenal bulb 48mm in largest dimension.  H. pylori was negative.   Ultrasound liver Doppler-04/21/2018-persistent nonocclusive thrombus in the distal portion of the main portal vein-somewhat less prominent compared to prior imaging  Ultrasound right upper quadrant on 01/08/2019 showed possible polyps in gallbladder which was somewhat thickened suggestive of cholesterolosis or adenomyomatosis versus inflammation.  Known cirrhosis.  Trace ascites.  Areas of nonocclusive thrombus in the main and left portal vein regions with appropriate flow.  Due to change in his insurance he was notified that he will need to receive Venofer as opposed to [place]. He says he has tolerated Venofer well in the past without significant side effects.  ECOG FS:1 - Symptomatic but completely ambulatory  Review of systems- Review of Systems  Constitutional: Positive for weight loss. Negative for chills, diaphoresis, fever and malaise/fatigue.  HENT: Negative for congestion, nosebleeds, sinus pain and sore throat.   Eyes: Negative for pain and redness.  Respiratory: Negative for hemoptysis, sputum production and shortness of breath.        Smoker  Cardiovascular: Negative for chest pain, palpitations, claudication and leg swelling.  Gastrointestinal: Positive for melena. Negative for abdominal pain, blood in stool, constipation, diarrhea, heartburn, nausea and vomiting.  Genitourinary: Negative for flank pain and hematuria.  Musculoskeletal: Positive for joint pain. Negative for falls and myalgias.  Skin: Negative for itching and rash.  Neurological: Negative for dizziness, weakness and headaches.  Endo/Heme/Allergies: Negative for environmental allergies. Bruises/bleeds  easily.  Psychiatric/Behavioral: Negative for depression and substance abuse. The patient is not nervous/anxious.      Allergies  Allergen Reactions  . Lorazepam Other (See Comments)    "made me anxious and mean"   . Iron Nausea Only    Past Medical History:  Diagnosis Date  . Anemia   . Cancer ([place])   . Cirrhosis ([place])   . Colon polyps   . Constipation   . Diarrhea   . Hemorrhoids   . Hypertension    in past  . Portal vein thrombosis    see chart review 06/05/15  . Shingles 09/2016  . Stomach ulcer   . Weight loss     Past Surgical History:  Procedure Laterality Date  . COLONOSCOPY N/A 06/17/2015   Procedure: COLONOSCOPY;  Surgeon: Hulen Luster, MD;  Location: [place];  Service: Gastroenterology;  Laterality: N/A;  . COLONOSCOPY WITH PROPOFOL N/A 02/02/2017   Procedure: COLONOSCOPY WITH PROPOFOL;  Surgeon: [name], MD;  Location: [place];  Service: Endoscopy;  Laterality: N/A;  . ENTEROSCOPY N/A 04/21/2018   Procedure: ENTEROSCOPY;  Surgeon: Lin Landsman, MD;  Location: [facility] ENDOSCOPY;  Service: Gastroenterology;  Laterality: N/A;  . ESOPHAGOGASTRODUODENOSCOPY N/A 05/14/2015   Procedure: ESOPHAGOGASTRODUODENOSCOPY (EGD);  Surgeon: Hulen Luster, MD;  Location: [facility] ENDOSCOPY;  Service: Endoscopy;  Laterality: N/A;  . ESOPHAGOGASTRODUODENOSCOPY N/A 04/21/2018   Procedure: ESOPHAGOGASTRODUODENOSCOPY (EGD);  Surgeon: Lin Landsman, MD;  Location[facility] ENDOSCOPY;  Service: Gastroenterology;  Laterality: N/A;  . ESOPHAGOGASTRODUODENOSCOPY (EGD) WITH PROPOFOL N/A 02/02/2017   Procedure: ESOPHAGOGASTRODUODENOSCOPY (EGD) WITH PROPOFOL;  Surgeon: [name], MD;  Location: [place];  Service: Endoscopy;  Laterality: N/A;  . POLYPECTOMY  02/02/2017   Procedure: POLYPECTOMY;  Surgeon: [name], MD;  Location[facility];  Service: Endoscopy;;    Social History   Socioeconomic History  . Marital status: Married    Spouse name: Not on file  . Number of children: Not on file  . Years of education: [employer]  . Highest education level:  Not on file  Occupational History  . Occupation: Retired  Scientific laboratory technician  . Financial resource strain: Not on file  . Food insecurity    Worry: Not on file    Inability: Not on file  . Transportation needs    Medical: Not on file    Non-medical: Not on file  Tobacco Use  . Smoking status: Current Every Day Smoker    Packs/day: 2.00    Years: 40.00    Pack years: 80.00    Types: Cigarettes  . Smokeless tobacco: Never Used  . Tobacco comment: Only successfully quit for 1 month before due to acute illness. (01/24/17 - down to 1.5 PPD)  Substance and Sexual Activity  . 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  . Drug use: No  . Sexual activity: Not on file  Lifestyle  . Physical activity    Days per week: Not on file    Minutes per session: Not on file  . Stress: Not on file  Relationships  . Social Herbalist on phone: Not on file    Gets together: Not on file    Attends religious service: Not on file    Active member of club or organization: Not on file    Attends meetings of clubs or organizations: Not on file    Relationship status: Not on file  .  Intimate partner violence    Fear of current or ex partner: Not on file    Emotionally abused: Not on file    Physically abused: Not on file    Forced sexual activity: Not on file  Other Topics Concern  . Not on file  Social History Narrative  . Not on file    Family History  Problem Relation Age of Onset  . CAD Father   . Heart attack Father     Current Outpatient Medications:  .  aspirin 325 MG tablet, Take 325 mg by mouth daily. 2-4 per day, Disp: , Rfl:  .  FERREX 150 150 MG capsule, TAKE 1 CAPSULE BY MOUTH EVERY DAY, Disp: 30 capsule, Rfl: 2 .  loratadine (CLARITIN) 10 MG tablet, Take 10 mg by mouth daily., Disp: , Rfl:  .  propranolol (INDERAL) 20 MG tablet, TAKE 1 TABLET BY MOUTH TWICE A DAY (Patient not taking: Reported on 04/04/2019), Disp: 60 tablet, Rfl: 0  Physical exam:   Vitals:   04/04/19 1008 04/04/19 1009  BP:  122/79  Pulse:  66  Resp:  16  Temp:  98.2 F (36.8 C)  TempSrc:  Tympanic  SpO2:  100%  Weight: 131 lb 14.4 oz (59.8 kg)    Physical Exam Constitutional:      Comments: Thin built, in exam room, no acute distress  HENT:     Head: Normocephalic and atraumatic.     Mouth/Throat:     Mouth: Mucous membranes are moist.     Pharynx: Oropharynx is clear.  Eyes:     General: No scleral icterus.    Conjunctiva/sclera: Conjunctivae normal.  Neck:     Musculoskeletal: Neck supple.  Cardiovascular:     Rate and Rhythm: Normal rate and regular rhythm.     Pulses: Normal pulses.     Heart sounds: Normal heart sounds.  Pulmonary:     Effort: Pulmonary effort is normal.     Breath sounds: Normal breath sounds.  Abdominal:     General: Abdomen is flat. There is no distension.     Palpations: Abdomen is soft. There is splenomegaly.     Tenderness: There is no abdominal tenderness. There is no guarding.  Musculoskeletal:        General: No swelling.  Skin:    General: Skin is warm and dry.     Coloration: Skin is not jaundiced or pale.  Neurological:     Mental Status: He is alert and oriented to person, place, and time.     Motor: No weakness.  Psychiatric:        Mood and Affect: Mood normal. Mood is not anxious.        Behavior: Behavior normal.    CMP Latest Ref Rng & Units 04/03/2019  Glucose 70 - 99 mg/dL 120(H)  BUN 6 - 20 mg/dL 13  Creatinine 0.61 - 1.24 mg/dL 0.54(L)  Sodium 135 - 145 mmol/L 136  Potassium 3.5 - 5.1 mmol/L 3.8  Chloride 98 - 111 mmol/L 109  CO2 22 - 32 mmol/L 19(L)  Calcium 8.9 - 10.3 mg/dL 8.2(L)  Total Protein 6.5 - 8.1 g/dL 6.1(L)  Total Bilirubin 0.3 - 1.2 mg/dL 0.3  Alkaline Phos 38 - 126 U/L 62  AST 15 - 41 U/L 14(L)  ALT 0 - 44 U/L 10   CBC Latest Ref Rng & Units 04/03/2019  WBC 4.0 - 10.5 K/uL 5.9  Hemoglobin 13.0 - 17.0 g/dL 7.9(L)  Hematocrit 39.0 - 52.0 %  25.9(L)  Platelets 150 - 400 K/uL  77(L)   No images are attached to the encounter.  No results found.  Assessment and plan- Patient is a 58 y.o. male with history of cirrhosis, varices, and iron deficiency anemia who presents to Symptom Management Clinic for symptoms concerning for upper GI bleed.   1. Symptomatic Anemia/poss upper GI bleed- symptoms & history (increased aspirin use recently; cirrhosis, hx of varices, smoker)  concerning for upper GI bleed given recent drop in h&h. Hg 7.9/hct 25.9 today. Bun/cr ratio 24.1. MCH low at 25.6. Ferritin 3 today. Hemoglobin was 10.7 in June and he received iron at that time. Hemodynamically stable. Symptomatic with possible melena though can't rule out oral iron as cause of dark/tarry stools.   Discussed findings with Dr. Mike Gip who recommends proceeding with venofer today and consulting Dr. Marius Ditch given prior findings of varices on prior EGD s/p banding. He did not have previously recommended capsule study. Discussed with Dr. Marius Ditch who says findings concerning for GI bleed and recommends admission to hospital for evaluation and poss EGD. May consider transfusion of prbcs as well. No evidence of infection but insetting of cirrhosis, consider prophylactic antibiotics.   2. Thrombocytopenia- plt 77,000. Stable. In setting of poss gi bleed continue to monitor.  3. Iron Deficiency Anemia- venofer today for ferritin of 3. Could consider stopping oral iron given side effects and apparent malabsorption. Will defer to Dr. Mike Gip.   Discussed with Ms. Stark Klein, NP who accepts for admission. Nursing unit report provided to floor.    Visit Diagnosis 1. Symptomatic anemia   2. Thrombocytopenia ([place])   3. Iron deficiency anemia due to chronic blood loss     Patient expressed understanding and was in agreement with this plan. He also understands that He can call clinic at any time with any questions, concerns, or complaints.   Thank you for allowing me to participate in the care of this very  pleasant patient.   Beckey Rutter, DNP, AGNP-C [place] [facility] [place] (work cell) [phone] (office)  CC: Dr. Mike Gip

## 2019-04-05 ENCOUNTER — Inpatient Hospital Stay: Payer: 59 | Admitting: Anesthesiology

## 2019-04-05 ENCOUNTER — Encounter
Admission: AD | Disposition: A | Discharge status: Home / Self Care | Payer: Self-pay | Source: Ambulatory Visit | Attending: Internal Medicine

## 2019-04-05 ENCOUNTER — Encounter: Payer: Self-pay | Admitting: *Deleted

## 2019-04-05 DIAGNOSIS — K3189 Other diseases of stomach and duodenum: Secondary | ICD-10-CM

## 2019-04-05 DIAGNOSIS — K766 Portal hypertension: Secondary | ICD-10-CM

## 2019-04-05 DIAGNOSIS — D62 Acute posthemorrhagic anemia: Secondary | ICD-10-CM

## 2019-04-05 DIAGNOSIS — K921 Melena: Secondary | ICD-10-CM

## 2019-04-05 DIAGNOSIS — I8511 Secondary esophageal varices with bleeding: Secondary | ICD-10-CM

## 2019-04-05 DIAGNOSIS — K253 Acute gastric ulcer without hemorrhage or perforation: Secondary | ICD-10-CM

## 2019-04-05 DIAGNOSIS — K269 Duodenal ulcer, unspecified as acute or chronic, without hemorrhage or perforation: Secondary | ICD-10-CM

## 2019-04-05 HISTORY — PX: ESOPHAGOGASTRODUODENOSCOPY: SHX5428

## 2019-04-05 HISTORY — PX: FLEXIBLE SIGMOIDOSCOPY: SHX5431

## 2019-04-05 LAB — BASIC METABOLIC PANEL
Anion gap: 6 (ref 5–15)
BUN: 9 mg/dL (ref 6–20)
CO2: 23 mmol/L (ref 22–32)
Calcium: 8.2 mg/dL — ABNORMAL LOW (ref 8.9–10.3)
Chloride: 113 mmol/L — ABNORMAL HIGH (ref 98–111)
Creatinine, Ser: 0.55 mg/dL — ABNORMAL LOW (ref 0.61–1.24)
GFR calc Af Amer: >60 mL/min (ref >60)
GFR calc non Af Amer: >60 mL/min (ref >60)
Glucose, Bld: 87 mg/dL (ref 70–99)
Potassium: 3.9 mmol/L (ref 3.5–5.1)
Sodium: 142 mmol/L (ref 135–145)

## 2019-04-05 LAB — CBC
HCT: 22.9 % — ABNORMAL LOW (ref 39.0–52.0)
Hemoglobin: 6.9 g/dL — ABNORMAL LOW (ref 13.0–17.0)
MCH: 25.4 pg — ABNORMAL LOW (ref 26.0–34.0)
MCHC: 30.1 g/dL (ref 30.0–36.0)
MCV: 84.2 fL (ref 80.0–100.0)
Platelets: 58 10*3/uL — ABNORMAL LOW (ref 150–400)
RBC: 2.72 MIL/uL — ABNORMAL LOW (ref 4.22–5.81)
RDW: 19 % — ABNORMAL HIGH (ref 11.5–15.5)
WBC: 2.6 10*3/uL — ABNORMAL LOW (ref 4.0–10.5)
nRBC: 0 % (ref 0.0–0.2)

## 2019-04-05 LAB — PREPARE RBC (CROSSMATCH)

## 2019-04-05 LAB — SARS CORONAVIRUS 2 (TAT 6-24 HRS): SARS Coronavirus 2: NEGATIVE

## 2019-04-05 SURGERY — EGD (ESOPHAGOGASTRODUODENOSCOPY)
Anesthesia: General

## 2019-04-05 SURGERY — SIGMOIDOSCOPY, FLEXIBLE
Anesthesia: General | Laterality: Left

## 2019-04-05 MED ORDER — SODIUM CHLORIDE 0.9 % IV SOLN: INTRAVENOUS | Status: DC

## 2019-04-05 MED ORDER — IPRATROPIUM-ALBUTEROL 0.5-2.5 (3) MG/3ML IN SOLN
RESPIRATORY_TRACT | Status: AC
  Administered 2019-04-05: 3 mL
  Filled 2019-04-05: qty 3

## 2019-04-05 MED ORDER — IPRATROPIUM-ALBUTEROL 0.5-2.5 (3) MG/3ML IN SOLN
3.0000 mL | Freq: Four times a day (QID) | RESPIRATORY_TRACT | Status: DC
  Administered 2019-04-05 – 2019-04-06 (×2): 3 mL via RESPIRATORY_TRACT
  Filled 2019-04-05 (×4): qty 3

## 2019-04-05 MED ORDER — GLYCOPYRROLATE 0.2 MG/ML IJ SOLN
INTRAMUSCULAR | Status: DC | PRN
  Administered 2019-04-05: 0.2 mg via INTRAVENOUS

## 2019-04-05 MED ORDER — SODIUM CHLORIDE 0.9 % IV SOLN
INTRAVENOUS | Status: DC
  Administered 2019-04-05: 11:00:00 via INTRAVENOUS

## 2019-04-05 MED ORDER — LIDOCAINE HCL (CARDIAC) PF 100 MG/5ML IV SOSY
PREFILLED_SYRINGE | INTRAVENOUS | Status: DC | PRN
  Administered 2019-04-05: 60 mg via INTRAVENOUS

## 2019-04-05 MED ORDER — ACETAMINOPHEN 325 MG PO TABS
650.0000 mg | ORAL_TABLET | Freq: Once | ORAL | Status: AC
  Administered 2019-04-05: 21:00:00 650 mg via ORAL
  Filled 2019-04-05: qty 2

## 2019-04-05 MED ORDER — PROPOFOL 500 MG/50ML IV EMUL
INTRAVENOUS | Status: DC | PRN
  Administered 2019-04-05: 140 ug/kg/min via INTRAVENOUS

## 2019-04-05 MED ORDER — PROPOFOL 10 MG/ML IV BOLUS
INTRAVENOUS | Status: DC | PRN
  Administered 2019-04-05: 60 mg via INTRAVENOUS
  Administered 2019-04-05: 20 mg via INTRAVENOUS

## 2019-04-05 MED ORDER — SODIUM CHLORIDE 0.9% IV SOLUTION
Freq: Once | INTRAVENOUS | Status: AC
  Administered 2019-04-05: 21:00:00 via INTRAVENOUS

## 2019-04-05 NOTE — Anesthesia Postprocedure Evaluation (Signed)
Anesthesia Post Note  Patient: Manuel Jimenez  Procedure(s) Performed: ESOPHAGOGASTRODUODENOSCOPY (EGD) (N/A ) FLEXIBLE SIGMOIDOSCOPY (N/A )  Patient location during evaluation: PACU Anesthesia Type: General Level of consciousness: awake and alert Pain management: pain level controlled Vital Signs Assessment: post-procedure vital signs reviewed and stable Respiratory status: spontaneous breathing, nonlabored ventilation and respiratory function stable Cardiovascular status: blood pressure returned to baseline and stable Postop Assessment: no apparent nausea or vomiting Anesthetic complications: no     Last Vitals:  Vitals:   04/05/19 1347 04/05/19 1403  BP: (!) 143/69 (!) 143/78  Pulse: 68 65  Resp: 16   Temp:  (!) 36.4 C  SpO2: 95% 95%    Last Pain:  Vitals:   04/05/19 1403  TempSrc: Oral  PainSc:                  Durenda Hurt

## 2019-04-05 NOTE — Progress Notes (Signed)
Dr. Bonna Gains wants to keep patient NPO for 2 hours then clear liguids.  Spoke with patient's wife with patients' permission. Educated her about Endo results. She understands and patient understands. Copy of instructions given. Wife worried about patient's weight loss and is questioning the future plan

## 2019-04-05 NOTE — Progress Notes (Signed)
[place] [facility] [place] NAME: Manuel Jimenez    MR#:  [id number]  DATE OF BIRTH:  1961-07-13  SUBJECTIVE:  CHIEF COMPLAINT:  No chief complaint on file. Came to hospital with complaint of dark stool, GI bleed. IMA globin dropped.  Seen after procedure today on endoscopy where he was found paralysis and banding was done.  REVIEW OF SYSTEMS:  CONSTITUTIONAL: No fever, fatigue or weakness.  EYES: No blurred or double vision.  EARS, NOSE, AND THROAT: No tinnitus or ear pain.  RESPIRATORY: No cough, shortness of breath, wheezing or hemoptysis.  CARDIOVASCULAR: No chest pain, orthopnea, edema.  GASTROINTESTINAL: No nausea, vomiting, diarrhea or abdominal pain.  GENITOURINARY: No dysuria, hematuria.  ENDOCRINE: No polyuria, nocturia,  HEMATOLOGY: No anemia, easy bruising or bleeding SKIN: No rash or lesion. MUSCULOSKELETAL: No joint pain or arthritis.   NEUROLOGIC: No tingling, numbness, weakness.  PSYCHIATRY: No anxiety or depression.   ROS  DRUG ALLERGIES:   Allergies  Allergen Reactions  . Lorazepam Other (See Comments)    "made me anxious and mean"   . Iron Nausea Only    VITALS:  Blood pressure (!) 143/78, pulse 65, temperature (!) 97.5 F (36.4 C), temperature source Oral, resp. rate 16, height 5\' 11"  (1.803 m), weight 59.8 kg, SpO2 95 %.  PHYSICAL EXAMINATION:  GENERAL:  58 y.o.-year-old patient lying in the bed with no acute distress.  EYES: Pupils equal, round, reactive to light and accommodation. No scleral icterus. Extraocular muscles intact.  HEENT: Head atraumatic, normocephalic. Oropharynx and nasopharynx clear.  NECK:  Supple, no jugular venous distention. No thyroid enlargement, no tenderness.  LUNGS: Normal breath sounds bilaterally, no wheezing, rales,rhonchi or crepitation. No use of accessory muscles of respiration.  CARDIOVASCULAR: S1, S2 normal. No murmurs, rubs, or gallops.  ABDOMEN: Soft, nontender, nondistended.  Bowel sounds present. No organomegaly or mass.  EXTREMITIES: No pedal edema, cyanosis, or clubbing.  NEUROLOGIC: Cranial nerves II through XII are intact. Muscle strength 5/5 in all extremities. Sensation intact. Gait not checked.  PSYCHIATRIC: The patient is alert and oriented x 3.  SKIN: No obvious rash, lesion, or ulcer.   Physical Exam LABORATORY PANEL:   CBC Recent Labs  Lab 04/05/19 0607  WBC 2.6*  HGB 6.9*  HCT 22.9*  PLT 58*   ------------------------------------------------------------------------------------------------------------------  Chemistries  Recent Labs  Lab 04/04/19 1348 04/05/19 0607  NA 141 142  K 3.6 3.9  CL 110 113*  CO2 22 23  GLUCOSE 88 87  BUN 15 9  CREATININE 0.58* 0.55*  CALCIUM 8.5* 8.2*  AST 14*  --   ALT 11  --   ALKPHOS 58  --   BILITOT 0.4  --    ------------------------------------------------------------------------------------------------------------------  Cardiac Enzymes No results for input(s): TROPONINI in the last 168 hours. ------------------------------------------------------------------------------------------------------------------  RADIOLOGY:  No results found.  ASSESSMENT AND PLAN:   Active Problems:   GI bleeding   Secondary esophageal varices with bleeding (HCC)   Acute esophagogastric ulcer   Portal hypertension (HCC)   Stomach irritation   Postpyloric ulcer   Acute posthemorrhagic anemia   Melena  58 y.o. male with past medical history of iron deficiency anemia secondary to GI bleed, thrombocytopenia, liver mass, hypertension,portal vein thrombosis, liver cirrhosis, EtOH abuse, alcoholic gastritis, portal hypertensive gastropathy, and esophageal varices banded presenting as direct admit from the cancer center with GI bleed.  1.  GI Bleed - Presenting with melena x 2-3 weeks with drop in hgb from 10.7  to 7.9 in one day without evidence hemodynamic instability  - IVFs to maintain MAP>65 - H&H  monitoring q6h - Blood Consent. Transfuse as Hgb<7 - Pantoprazole 80mg  IV x1 then gtt 8mg /hr - On octreotide gtt- GI suggest to continue for total 72 hours since starting - Hold NSAIDs, steroids, ASA -Appreciated GI consult, endoscopy was done which showed esophageal varices and banding is done.  Nonbleeding gastric ulcer. -Suggested to start on liquid diet today and from tomorrow soft diet for 7 days. -Follow-up in GI clinic within a month for repeat endoscopy.  2. Iron deficiency anemia - Following with hematology oncologist - Hgb stable at10.1.  Receiving Venofer at the cancer center.Iron saturation5%, ferritin 3  3. Liver cirrhosis -patient with a history of portal vein thrombosis and esophageal varices banded -Recheck CMP now -Gram-negative coverage with ceftriaxone due to his cirrhosis and GI bleed  4.Thrombocytopenia likely secondary to cirrhosis - continue to monitor for active bleed and infection  5. Alcoholic gastritis/ulcer - Continue Protonix drip as above  6. DVT prophylaxis - Hold anti-coagulation for thrombocytopenia / pending procedure / active bleed /   7.  Active smoker Tobacco cessation counseling is done for 4 minutes and offered nicotine patch.   All the records are reviewed and case discussed with Care Management/Social Workerr. Management plans discussed with the patient, family and they are in agreement.  CODE STATUS: full  TOTAL TIME TAKING CARE OF THIS PATIENT: 35 minutes.   Patient's wife was present in the room during my visit.  POSSIBLE D/C IN 2 DAYS, DEPENDING ON CLINICAL CONDITION.   [name] M.D on 04/05/2019   Between 7am to 6pm - Pager - [phone]  After 6pm go to [email or web address] - password [email or web address]  Office  [phone]  CC: Primary care physician; [name], DO  Note: This dictation was prepared with Dragon dictation along with smaller phrase technology. Any  transcriptional errors that result from this process are unintentional.

## 2019-04-05 NOTE — Progress Notes (Signed)
   04/05/19 1100  Clinical Encounter Type  Visited With Family  Visit Type Initial  Ch was rounding. Wife was waiting in the room while the pt was gone to get EDG test. Wife shared about how over the last four years the pt has experienced in decline in health. She also said she just started taking care of her grandson since march who is only 49 months old. Wife even with all the responsibilities is doing well. Ch will come back later to check on the pt and to provide additional support.

## 2019-04-05 NOTE — Progress Notes (Signed)
Initial Nutrition Assessment  DOCUMENTATION CODES:   Underweight  INTERVENTION:   Recommend Ensure Enlive po TID, each supplement provides 350 kcal and 20 grams of protein  Recommend MVI, thiamine and folic acid daily in setting of etoh abuse  NUTRITION DIAGNOSIS:   Increased nutrient needs related to chronic illness(cirrhosis) as evidenced by increased estimated needs.  GOAL:   Patient will meet greater than or equal to 90% of their needs  MONITOR:   PO intake, Supplement acceptance, Labs, Weight trends, Skin, I & O's  REASON FOR ASSESSMENT:   Other (Comment)(Low BMI)    ASSESSMENT:   58 year old male with past medical history of iron deficiency anemia secondary to GI bleed, thrombocytopenia, liver mass, hypertension,portal vein thrombosis, liver cirrhosis, EtOH abuse, alcoholic gastritis, portal hypertensive gastropathy, and esophageal varices banded presenting as direct admit from the cancer center with GI bleed.   Pt s/p EGD 9/4; found to have varices, gastric and duodenal ulcers, gastropathy  RD working remotely.  Pt currently NPO for EGD today. Suspect pt with poor appetite and oral intake at baseline r/t etoh abuse. RD will add supplements and vitamins once diet advanced. Per chart, pt down 8lbs(6%) over the past 3 months; this is not significant.   Pt at high risk for malnutrition but unable to diagnose at this time as NFPE cannot be performed.   Medications reviewed and include: iron, protonix, ceftriaxone, octreotide  Labs reviewed: creat 0.55(L) Wbc- 2.6(L), Hgb 6.9(L), Hct 22.9(L), MCH 25.4(L) Iron 22(L), TIBC 421(H)- 6/2 Ferritin 3(L)- 9/2 B12- 233- 9/2  Unable to complete Nutrition-Focused physical exam at this time.   Diet Order:   Diet Order            Diet NPO time specified  Diet effective midnight             EDUCATION NEEDS:   No education needs have been identified at this time  Skin:  Skin Assessment: Reviewed RN  Assessment(ecchymosis)  Last BM:  9/3  Height:   Ht Readings from Last 1 Encounters:  04/04/19 5\' 11"  (1.803 m)    Weight:   Wt Readings from Last 1 Encounters:  04/04/19 59.8 kg    Ideal Body Weight:  78.2 kg  BMI:  Body mass index is 18.4 kg/m.  Estimated Nutritional Needs:   Kcal:  1700-1900kcal/day  Protein:  85-95g/day  Fluid:  >1.8L/day  Koleen Distance MS, RD, LDN Pager #- [phone] Office#- [phone] After Hours Pager: [phone]

## 2019-04-05 NOTE — Anesthesia Preprocedure Evaluation (Addendum)
Anesthesia Evaluation  Patient identified by MRN, date of birth, ID band Patient awake    Reviewed: Allergy & Precautions, H&P , NPO status , Patient's Chart, lab work & pertinent test results  Airway Mallampati: II  TM Distance: >3 FB     Dental  (+) Missing   Pulmonary neg pulmonary ROS, neg recent URI, Current Smoker and Patient abstained from smoking.,           Cardiovascular hypertension, (-) angina(-) Past MI, (-) Cardiac Stents and (-) CABG (-) dysrhythmias      Neuro/Psych negative neurological ROS     GI/Hepatic PUD, GERD  Controlled,(+) Cirrhosis  (alcoholic)  Esophageal Varices  substance abuse  alcohol use, H/o variceal banding x 1   Endo/Other  negative endocrine ROS  Renal/GU negative Renal ROS  negative genitourinary   Musculoskeletal   Abdominal   Peds  Hematology  (+) Blood dyscrasia, anemia , Pancytopenia Hgb 6.9 Platelets 58K   Anesthesia Other Findings Past Medical History: No date: Anemia No date: Cancer ([place]) No date: Cirrhosis ([place]) No date: Colon polyps No date: Constipation No date: Diarrhea No date: Hemorrhoids No date: Hypertension     Comment:  in past No date: Portal vein thrombosis     Comment:  see chart review 06/05/15 09/2016: Shingles No date: Stomach ulcer No date: Weight loss  Past Surgical History: 06/17/2015: COLONOSCOPY; N/A     Comment:  Procedure: COLONOSCOPY;  Surgeon: Hulen Luster, MD;                Location: [place];  Service:               Gastroenterology;  Laterality: N/A; 02/02/2017: COLONOSCOPY WITH PROPOFOL; N/A     Comment:  Procedure: COLONOSCOPY WITH PROPOFOL;  Surgeon: [name], MD;  Location: [facility];  Service:               Endoscopy;  Laterality: N/A; 04/21/2018: ENTEROSCOPY; N/A     Comment:  Procedure: ENTEROSCOPY;  Surgeon: Lin Landsman,               MD;  Location: [facility] ENDOSCOPY;  Service:              Gastroenterology;  Laterality: N/A; 05/14/2015: ESOPHAGOGASTRODUODENOSCOPY; N/A     Comment:  Procedure: ESOPHAGOGASTRODUODENOSCOPY (EGD);  Surgeon:               Hulen Luster, MD;  Location: [facility] ENDOSCOPY;  Service:               Endoscopy;  Laterality: N/A; 04/21/2018: ESOPHAGOGASTRODUODENOSCOPY; N/A     Comment:  Procedure: ESOPHAGOGASTRODUODENOSCOPY (EGD);  Surgeon:               Lin Landsman, MD;  Location: [facility] ENDOSCOPY;                Service: Gastroenterology;  Laterality: N/A; 02/02/2017: ESOPHAGOGASTRODUODENOSCOPY (EGD) WITH PROPOFOL; N/A     Comment:  Procedure: ESOPHAGOGASTRODUODENOSCOPY (EGD) WITH               PROPOFOL;  Surgeon: [name], MD;  Location: [place];  Service: Endoscopy;  Laterality: N/A; 02/02/2017: POLYPECTOMY     Comment:  Procedure: POLYPECTOMY;  Surgeon: [name], MD;  Location: [place];  Service: Endoscopy;;  BMI    Body Mass Index: 18.40 kg/m      Reproductive/Obstetrics negative OB ROS                           Anesthesia Physical Anesthesia Plan  ASA: III  Anesthesia Plan: General   Post-op Pain Management:    Induction:   PONV Risk Score and Plan: Propofol infusion and TIVA  Airway Management Planned: Nasal Cannula and Natural Airway  Additional Equipment:   Intra-op Plan:   Post-operative Plan:   Informed Consent: I have reviewed the patients History and Physical, chart, labs and discussed the procedure including the risks, benefits and alternatives for the proposed anesthesia with the patient or authorized representative who has indicated his/her understanding and acceptance.     Dental Advisory Given  Plan Discussed with: Anesthesiologist and CRNA  Anesthesia Plan Comments: (Discussed with gastroenterologist platelets of 58K, she suspects bleeding ulcer over variceal bleed and recommends proceeding with platelets >50K.  Discussed  no platelets in house, she understands and wishes to proceed.  KLF)       Anesthesia Quick Evaluation

## 2019-04-05 NOTE — Op Note (Signed)
[facility] Patient Name: Manuel Jimenez Procedure Date: 04/05/2019 12:22 PM MRN: [id number] Account #: [id number] Date of Birth: Jul 24, 1961 Admit Type: Outpatient Age: 58 Room: [facility] ENDO ROOM 2 Gender: Male Note Status: Finalized Procedure:            Upper GI endoscopy Indications:          Acute post hemorrhagic anemia, Melena Providers:            Demiya Magno B. Bonna Gains MD, MD Medicines:            Monitored Anesthesia Care Complications:        No immediate complications. Procedure:            Pre-Anesthesia Assessment:                       - The risks and benefits of the procedure and the                        sedation options and risks were discussed with the                        patient. All questions were answered and informed                        consent was obtained.                       - Patient identification and proposed procedure were                        verified prior to the procedure.                       - ASA Grade Assessment: III - A patient with severe                        systemic disease.                       After obtaining informed consent, the endoscope was                        passed under direct vision. Throughout the procedure,                        the patient's blood pressure, pulse, and oxygen                        saturations were monitored continuously. The Endoscope                        was introduced through the mouth, and advanced to the                        second part of duodenum. The upper GI endoscopy was                        accomplished with ease. The patient tolerated the                        procedure well. Findings:  Two columns of non-bleeding grade II varices were found in the distal       esophagus,. Stigmata of recent bleeding were evident and red wale signs       were present. Stigmata of prior treatment were evident. Four bands were       successfully placed with  complete eradication, resulting in deflation of       varices. There was no bleeding at the end of the procedure.      One non-bleeding gastric ulcer with a clean ulcer base (Forrest Class       III) was found in the stomach. The lesion was 4 mm in largest dimension.      Mild portal hypertensive gastropathy was found in the gastric fundus and       in the gastric body.      Patchy mildly erythematous mucosa without bleeding was found in the       gastric antrum.      One non-bleeding superficial duodenal ulcer with a clean ulcer base       (Forrest Class III) was found in the duodenal bulb. The lesion was 4 mm       in largest dimension.      The second portion of the duodenum was normal. Impression:           - Recently bleeding grade II esophageal varices.                        Completely eradicated. Banded.                       - Non-bleeding gastric ulcer with a clean ulcer base                        (Forrest Class III).                       - Portal hypertensive gastropathy.                       - Erythematous mucosa in the antrum.                       - Non-bleeding duodenal ulcer with a clean ulcer base                        (Forrest Class III).                       - Normal second portion of the duodenum.                       - No specimens collected. Recommendation:       - Clear liquids today, soft diet for 7 days after that.                       - D/c octreotide drip after 72 hours from the time of                        initiation                       Continue Protonix BID                       -  Return to GI clinic in 2 weeks, with Dr. Marius Ditch.                       - Repeat upper endoscopy in 4 years.                       - Perform an H. pylori serology today.                       - Continue Serial CBCs and transfuse PRN                       - Avoid NSAIDs except Aspirin if medically indicated by                        PCP Procedure Code(s):    ---  Professional ---                       [id number], Esophagogastroduodenoscopy, flexible, transoral;                        with band ligation of esophageal/gastric varices Diagnosis Code(s):    --- Professional ---                       I85.01, Esophageal varices with bleeding                       K25.9, Gastric ulcer, unspecified as acute or chronic,                        without hemorrhage or perforation                       K76.6, Portal hypertension                       K31.89, Other diseases of stomach and duodenum                       K26.9, Duodenal ulcer, unspecified as acute or chronic,                        without hemorrhage or perforation                       D62, Acute posthemorrhagic anemia                       K92.1, Melena (includes Hematochezia) CPT copyright 2019 American Medical Association. All rights reserved. The codes documented in this report are preliminary and upon coder review may  be revised to meet current compliance requirements.  Vonda Antigua, MD Margretta Sidle B. Bonna Gains MD, MD 04/05/2019 1:17:47 PM This report has been signed electronically. Number of Addenda: 0 Note Initiated On: 04/05/2019 12:22 PM Estimated Blood Loss: Estimated blood loss: none.      [facility]

## 2019-04-05 NOTE — Anesthesia Post-op Follow-up Note (Signed)
Anesthesia QCDR form completed.        

## 2019-04-05 NOTE — Transfer of Care (Signed)
Immediate Anesthesia Transfer of Care Note  Patient: Manuel Jimenez  Procedure(s) Performed: ESOPHAGOGASTRODUODENOSCOPY (EGD) (N/A ) FLEXIBLE SIGMOIDOSCOPY (N/A )  Patient Location: PACU  Anesthesia Type:General  Level of Consciousness: sedated  Airway & Oxygen Therapy: Patient Spontanous Breathing and Patient connected to face mask oxygen  Post-op Assessment: Report given to RN and Post -op Vital signs reviewed and stable  Post vital signs: Reviewed and stable  Last Vitals:  Vitals Value Taken Time  BP 144/63 04/05/19 1311  Temp 36.4 C 04/05/19 1311  Pulse 86 04/05/19 1311  Resp 21 04/05/19 1311  SpO2 99 % 04/05/19 1311    Last Pain:  Vitals:   04/05/19 1311  TempSrc:   PainSc: 0-No pain         Complications: No apparent anesthesia complications

## 2019-04-05 NOTE — Progress Notes (Signed)
Manuel Antigua, MD [address] [place], [place], [place], [address] [address], [place], [place], [place], [address] Phone: [phone]  Fax: [phone]   Subjective: Patient resting in bed comfortably.  Denies any further melena.  No emesis.  No abdominal pain.  No active bleeding the last 24 hours  Objective: Exam: Vital signs in last 24 hours: Vitals:   04/04/19 1406 04/04/19 1950 04/05/19 0356 04/05/19 1020  BP:  133/62 (!) 115/58 138/71  Pulse:  62 (!) 59 (!) 56  Resp:  18 16 16   Temp:  98.7 F (37.1 C) 98.7 F (37.1 C) 97.8 F (36.6 C)  TempSrc:  Oral Oral Tympanic  SpO2:  96% 91% 92%  Weight: 59.8 kg     Height:       Weight change:   Intake/Output Summary (Last 24 hours) at 04/05/2019 1232 Last data filed at 04/05/2019 0304 Gross per 24 hour  Intake 895.77 ml  Output -  Net 895.77 ml    General: No acute distress, AAO x3 Abd: Soft, NT/ND, No HSM Skin: Warm, no rashes Neck: Supple, Trachea midline   Lab Results: Lab Results  Component Value Date   WBC 2.6 (L) 04/05/2019   HGB 6.9 (L) 04/05/2019   HCT 22.9 (L) 04/05/2019   MCV 84.2 04/05/2019   PLT 58 (L) 04/05/2019   Micro Results: Recent Results (from the past 240 hour(s))  SARS CORONAVIRUS 2 (TAT 6-24 HRS) Nasopharyngeal Nasopharyngeal Swab     Status: None   Collection Time: 04/04/19  3:34 PM   Specimen: Nasopharyngeal Swab  Result Value Ref Range Status   SARS Coronavirus 2 NEGATIVE NEGATIVE Final    Comment: (NOTE) SARS-CoV-2 target nucleic acids are NOT DETECTED. The SARS-CoV-2 RNA is generally detectable in upper and lower respiratory specimens during the acute phase of infection. Negative results do not preclude SARS-CoV-2 infection, do not rule out co-infections with other pathogens, and should not be used as the sole basis for treatment or other patient management decisions. Negative results must be combined with clinical observations, patient history, and  epidemiological information. The expected result is Negative. Fact Sheet for Patients: SugarRoll.be Fact Sheet for Healthcare Providers: https://www.woods-mathews.com/ This test is not yet approved or cleared by the [place] FDA and  has been authorized for detection and/or diagnosis of SARS-CoV-2 by FDA under an Emergency Use Authorization (EUA). This EUA will remain  in effect (meaning this test can be used) for the duration of the COVID-19 declaration under Section 56 4(b)(1) of the Act, 21 U.S.C. section 360bbb-3(b)(1), unless the authorization is terminated or revoked sooner. Performed at [facility], [place] [address] [place], [place] [address]    Studies/Results: No results found. Medications:  Scheduled Meds: . [MAR Hold] sodium chloride   Intravenous Once  . [MAR Hold] iron polysaccharides  150 mg Oral Daily  . [MAR Hold] loratadine  10 mg Oral Daily  . [MAR Hold] pantoprazole  40 mg Intravenous Q12H   Continuous Infusions: . sodium chloride 10 mL/hr at 04/05/19 0304  . sodium chloride    . sodium chloride 20 mL/hr at 04/05/19 1037  . [MAR Hold] cefTRIAXone (ROCEPHIN)  IV Stopped (04/04/19 1603)  . octreotide  (SANDOSTATIN)    IV infusion 50 mcg/hr (04/05/19 0304)  . pantoprozole (PROTONIX) infusion 8 mg/hr (04/05/19 0304)   PRN Meds:.   Assessment: Active Problems:   GI bleeding    Plan: We extensively discussed risks and benefits of an EGD Also discussed  that if banding is needed for varices, it entails risk of further bleeding during the banding as well.  However, not doing the procedure and treating varices that may have recently bled have higher risks of rebleeding  Patient verbalized understanding of these risks and is agreeable to proceeding  I have discussed alternative options, risks & benefits,  which include, but are not limited to, bleeding, infection, perforation,respiratory complication & drug  reaction.  The patient agrees with this plan & written consent will be obtained.     LOS: 1 day   Manuel Antigua, MD 04/05/2019, 12:32 PM

## 2019-04-06 LAB — TYPE AND SCREEN
ABO/RH(D): A POS
Antibody Screen: NEGATIVE
Unit division: 0

## 2019-04-06 LAB — CBC
HCT: 28.5 % — ABNORMAL LOW (ref 39.0–52.0)
Hemoglobin: 8.6 g/dL — ABNORMAL LOW (ref 13.0–17.0)
MCH: 25.1 pg — ABNORMAL LOW (ref 26.0–34.0)
MCHC: 30.2 g/dL (ref 30.0–36.0)
MCV: 83.3 fL (ref 80.0–100.0)
Platelets: 51 10*3/uL — ABNORMAL LOW (ref 150–400)
RBC: 3.42 MIL/uL — ABNORMAL LOW (ref 4.22–5.81)
RDW: 18.6 % — ABNORMAL HIGH (ref 11.5–15.5)
WBC: 3.9 10*3/uL — ABNORMAL LOW (ref 4.0–10.5)
nRBC: 0 % (ref 0.0–0.2)

## 2019-04-06 LAB — BPAM RBC
Blood Product Expiration Date: 202009232359
ISSUE DATE / TIME: 202009042112
Unit Type and Rh: 6200

## 2019-04-06 MED ORDER — ACETAMINOPHEN 325 MG PO TABS
650.0000 mg | ORAL_TABLET | Freq: Once | ORAL | Status: AC
  Administered 2019-04-06: 10:00:00 650 mg via ORAL
  Filled 2019-04-06: qty 2

## 2019-04-06 MED ORDER — IPRATROPIUM-ALBUTEROL 0.5-2.5 (3) MG/3ML IN SOLN: 3.0000 mL | Freq: Four times a day (QID) | RESPIRATORY_TRACT | Status: DC | PRN

## 2019-04-06 NOTE — Progress Notes (Signed)
[place] [facility] [place] NAME: Manuel Jimenez    MR#:  [id number]  DATE OF BIRTH:  07-Dec-1960  SUBJECTIVE:  CHIEF COMPLAINT:  No chief complaint on file. Came to hospital with complaint of dark stool, GI bleed. Hemoglobin dropped. One unit PRBC- stable now. On EGD he was found Verices and banding was done.  No BM. Tolerating oral liquids. Had c/o headache today due to not eating for 2 days.  REVIEW OF SYSTEMS:  CONSTITUTIONAL: No fever, fatigue or weakness.  EYES: No blurred or double vision.  EARS, NOSE, AND THROAT: No tinnitus or ear pain.  RESPIRATORY: No cough, shortness of breath, wheezing or hemoptysis.  CARDIOVASCULAR: No chest pain, orthopnea, edema.  GASTROINTESTINAL: No nausea, vomiting, diarrhea or abdominal pain.  GENITOURINARY: No dysuria, hematuria.  ENDOCRINE: No polyuria, nocturia,  HEMATOLOGY: No anemia, easy bruising or bleeding SKIN: No rash or lesion. MUSCULOSKELETAL: No joint pain or arthritis.   NEUROLOGIC: No tingling, numbness, weakness.  PSYCHIATRY: No anxiety or depression.   ROS  DRUG ALLERGIES:   Allergies  Allergen Reactions  . Lorazepam Other (See Comments)    "made me anxious and mean"   . Iron Nausea Only    VITALS:  Blood pressure (!) 144/67, pulse (!) 55, temperature 99.1 F (37.3 C), temperature source Oral, resp. rate 16, height 5\' 11"  (1.803 m), weight 59.8 kg, SpO2 94 %.  PHYSICAL EXAMINATION:  GENERAL:  58 y.o.-year-old patient lying in the bed with no acute distress.  EYES: Pupils equal, round, reactive to light and accommodation. No scleral icterus. Extraocular muscles intact.  HEENT: Head atraumatic, normocephalic. Oropharynx and nasopharynx clear.  NECK:  Supple, no jugular venous distention. No thyroid enlargement, no tenderness.  LUNGS: Normal breath sounds bilaterally, no wheezing, rales,rhonchi or crepitation. No use of accessory muscles of respiration.  CARDIOVASCULAR: S1, S2 normal.  No murmurs, rubs, or gallops.  ABDOMEN: Soft, nontender, nondistended. Bowel sounds present. No organomegaly or mass.  EXTREMITIES: No pedal edema, cyanosis, or clubbing.  NEUROLOGIC: Cranial nerves II through XII are intact. Muscle strength 5/5 in all extremities. Sensation intact. Gait not checked.  PSYCHIATRIC: The patient is alert and oriented x 3.  SKIN: No obvious rash, lesion, or ulcer.   Physical Exam LABORATORY PANEL:   CBC Recent Labs  Lab 04/06/19 0441  WBC 3.9*  HGB 8.6*  HCT 28.5*  PLT 51*   ------------------------------------------------------------------------------------------------------------------  Chemistries  Recent Labs  Lab 04/04/19 1348 04/05/19 0607  NA 141 142  K 3.6 3.9  CL 110 113*  CO2 22 23  GLUCOSE 88 87  BUN 15 9  CREATININE 0.58* 0.55*  CALCIUM 8.5* 8.2*  AST 14*  --   ALT 11  --   ALKPHOS 58  --   BILITOT 0.4  --    ------------------------------------------------------------------------------------------------------------------  Cardiac Enzymes No results for input(s): TROPONINI in the last 168 hours. ------------------------------------------------------------------------------------------------------------------  RADIOLOGY:  No results found.  ASSESSMENT AND PLAN:   Active Problems:   GI bleeding   Secondary esophageal varices with bleeding (HCC)   Acute esophagogastric ulcer   Portal hypertension (HCC)   Stomach irritation   Postpyloric ulcer   Acute posthemorrhagic anemia   Melena  58 y.o. male with past medical history of iron deficiency anemia secondary to GI bleed, thrombocytopenia, liver mass, hypertension,portal vein thrombosis, liver cirrhosis, EtOH abuse, alcoholic gastritis, portal hypertensive gastropathy, and esophageal varices banded presenting as direct admit from the cancer center with GI bleed.  1.  GI Bleed - Presenting with melena x 2-3 weeks with drop in hgb from 10.7 to 7.9 in one day without  evidence hemodynamic instability  - IVFs to maintain MAP>65- stop now as tolerating oral. - H&H monitoring q6h - Blood Consent. Transfuse as Hgb<7 - Pantoprazole 80mg  IV x1 then gtt 8mg /hr - On octreotide gtt- GI suggest to continue for total 72 hours since starting - Hold NSAIDs, steroids, ASA -Appreciated GI consult, endoscopy was done which showed esophageal varices and banding is done.  Nonbleeding gastric ulcer. -Suggested to start on liquid diet for one day and then soft diet for 7 days. -Follow-up in GI clinic within a month for repeat endoscopy.  2. Iron deficiency anemia - Following with hematology oncologist - Hgb stable at10.1.  Receiving Venofer at the cancer center.Iron saturation5%, ferritin 3  3. Liver cirrhosis -patient with a history of portal vein thrombosis and esophageal varices banded -Recheck CMP now -Gram-negative coverage with ceftriaxone due to his cirrhosis and GI bleed  4.Thrombocytopenia likely secondary to cirrhosis - continue to monitor for active bleed and infection  5. Alcoholic gastritis/ulcer - Continue Protonix drip as above  6. DVT prophylaxis - Hold anti-coagulation for thrombocytopenia / pending procedure / active bleed /   7.  Active smoker Tobacco cessation counseling is done for 4 minutes and offered nicotine patch.   All the records are reviewed and case discussed with Care Management/Social Workerr. Management plans discussed with the patient, family and they are in agreement.  CODE STATUS: full  TOTAL TIME TAKING CARE OF THIS PATIENT: 35 minutes.   Patient's wife was present in the room during my visit.  POSSIBLE D/C IN 1 DAYS, DEPENDING ON CLINICAL CONDITION.   Manuel Jimenez M.D on 04/06/2019   Between 7am to 6pm - Pager - [phone]  After 6pm go to [email or web address] - password [email or web address] [facility]  Office  [phone]  CC: Primary care physician; Manuel Hauser, DO  Note:  This dictation was prepared with Dragon dictation along with smaller phrase technology. Any transcriptional errors that result from this process are unintentional.

## 2019-04-07 LAB — CBC
HCT: 28.3 % — ABNORMAL LOW (ref 39.0–52.0)
Hemoglobin: 8.5 g/dL — ABNORMAL LOW (ref 13.0–17.0)
MCH: 24.9 pg — ABNORMAL LOW (ref 26.0–34.0)
MCHC: 30 g/dL (ref 30.0–36.0)
MCV: 82.7 fL (ref 80.0–100.0)
Platelets: 54 10*3/uL — ABNORMAL LOW (ref 150–400)
RBC: 3.42 MIL/uL — ABNORMAL LOW (ref 4.22–5.81)
RDW: 19 % — ABNORMAL HIGH (ref 11.5–15.5)
WBC: 4.3 10*3/uL (ref 4.0–10.5)
nRBC: 0 % (ref 0.0–0.2)

## 2019-04-07 MED ORDER — PANTOPRAZOLE SODIUM 40 MG PO TBEC: 40.0000 mg | DELAYED_RELEASE_TABLET | Freq: Two times a day (BID) | ORAL | 1 refills | Status: AC

## 2019-04-07 NOTE — Progress Notes (Signed)
MD order received in [facility] to discharge pt home; verbally reviewed AVS with pt; no questions voiced at this time; pt discharged via wheelchair by nursing to the visitor's entrance

## 2019-04-07 NOTE — Progress Notes (Signed)
   Vonda Antigua, MD [address] [place], [place], [place], [address] [address], [place], [place], [place], [address] Phone: [phone]  Fax: [phone]   Subjective: Patient has not had any bowel movements in 2 to 3 days.  No abdominal pain.  Tolerating oral diet.   Objective: Exam: Vital signs in last 24 hours: Vitals:   04/06/19 0747 04/06/19 1711 04/06/19 2011 04/07/19 0413  BP:  135/72 (!) 141/72 (!) 144/69  Pulse:  60 (!) 58 (!) 59  Resp:  18 16 15   Temp:  98.7 F (37.1 C) 98.6 F (37 C) 98.5 F (36.9 C)  TempSrc:  Oral Oral Oral  SpO2: 94% 97% 96% 94%  Weight:      Height:       Weight change:   Intake/Output Summary (Last 24 hours) at 04/07/2019 1517 Last data filed at 04/07/2019 1505 Gross per 24 hour  Intake 1481.06 ml  Output -  Net 1481.06 ml    General: No acute distress, AAO x3 Abd: Soft, NT/ND, No HSM Skin: Warm, no rashes Neck: Supple, Trachea midline   Lab Results: Lab Results  Component Value Date   WBC 4.3 04/07/2019   HGB 8.5 (L) 04/07/2019   HCT 28.3 (L) 04/07/2019   MCV 82.7 04/07/2019   PLT 54 (L) 04/07/2019   Micro Results: Recent Results (from the past 240 hour(s))  SARS CORONAVIRUS 2 (TAT 6-24 HRS) Nasopharyngeal Nasopharyngeal Swab     Status: None   Collection Time: 04/04/19  3:34 PM   Specimen: Nasopharyngeal Swab  Result Value Ref Range Status   SARS Coronavirus 2 NEGATIVE NEGATIVE Final    Comment: (NOTE) SARS-CoV-2 target nucleic acids are NOT DETECTED. The SARS-CoV-2 RNA is generally detectable in upper and lower respiratory specimens during the acute phase of infection. Negative results do not preclude SARS-CoV-2 infection, do not rule out co-infections with other pathogens, and should not be used as the sole basis for treatment or other patient management decisions. Negative results must be combined with clinical observations, patient history, and epidemiological information. The expected result is  Negative. Fact Sheet for Patients: SugarRoll.be Fact Sheet for Healthcare Providers: https://www.woods-mathews.com/ This test is not yet approved or cleared by the [place] FDA and  has been authorized for detection and/or diagnosis of SARS-CoV-2 by FDA under an Emergency Use Authorization (EUA). This EUA will remain  in effect (meaning this test can be used) for the duration of the COVID-19 declaration under Section 56 4(b)(1) of the Act, 21 U.S.C. section 360bbb-3(b)(1), unless the authorization is terminated or revoked sooner. Performed at [facility], [place] [address], [place], [place] [address]    Studies/Results: No results found. Medications:  Scheduled Meds: . iron polysaccharides  150 mg Oral Daily  . loratadine  10 mg Oral Daily  . [START ON 04/08/2019] pantoprazole  40 mg Intravenous Q12H   Continuous Infusions: . cefTRIAXone (ROCEPHIN)  IV Stopped (04/06/19 1808)   PRN Meds:.ipratropium-albuterol   Assessment: Active Problems:   GI bleeding   Secondary esophageal varices with bleeding (HCC)   Acute esophagogastric ulcer   Portal hypertension (HCC)   Stomach irritation   Postpyloric ulcer   Acute posthemorrhagic anemia   Melena    Plan: No further episodes of bleeding Importance of follow-up in GI clinic discussed in detail and patient and wife verbalized understanding Importance of avoiding NSAIDs also discussed    LOS: 3 days   Vonda Antigua, MD 04/07/2019, 3:17 PM

## 2019-04-07 NOTE — Discharge Summary (Signed)
[facility] NAME: Manuel Jimenez    MR#:  [id number]  DATE OF BIRTH:  [birth year]  DATE OF ADMISSION:  04/04/2019 ADMITTING PHYSICIAN: Lang Snow, NP  DATE OF DISCHARGE: 04/07/2019   PRIMARY CARE PHYSICIAN: [name], DO    ADMISSION DIAGNOSIS:  Upper GI Bleed  DISCHARGE DIAGNOSIS:  Active Problems:   GI bleeding   Secondary esophageal varices with bleeding ([place])   Acute esophagogastric ulcer   Portal hypertension ([place])   Stomach irritation   Postpyloric ulcer   Acute posthemorrhagic anemia   Melena   SECONDARY DIAGNOSIS:   Past Medical History:  Diagnosis Date  . Anemia   . Cancer ([place])   . Cirrhosis ([place])   . Colon polyps   . Constipation   . Diarrhea   . Hemorrhoids   . Hypertension    in past  . Portal vein thrombosis    see chart review 06/05/15  . Shingles 09/2016  . Stomach ulcer   . Weight loss     HOSPITAL COURSE:   58 y.o.malewith past medical history of iron deficiency anemia secondary to GI bleed, thrombocytopenia, liver mass, hypertension,portal vein thrombosis, liver cirrhosis, EtOH abuse, alcoholic gastritis, portal hypertensive gastropathy, and esophageal varices banded presenting as direct admit from the cancer center with GI bleed.  1.GI Bleed - Presenting with melena x 2-3 weeks with drop in hgb from 10.7 to 7.9 in one daywithout evidence hemodynamic instability  -IVFsto maintain MAP>65- stop now as tolerating oral. - H&H monitoring q6h - Blood Consent. Transfuse as Hgb<7 - Pantoprazole 80mg  IV x1 then gtt 8mg /hr - On octreotide gtt- GI suggest to continue for total 72 hours since starting - Hold NSAIDs, steroids, ASA -Appreciated GI consult, endoscopy was done which showed esophageal varices and banding is done.  Nonbleeding gastric ulcer. -Suggested to start on liquid diet for one day and then soft diet for 7 days. -Follow-up in GI clinic within  a month for repeat endoscopy. - finished 72 hrs of OCtreotide and PPI drip-hemoglobin remained stable and patient tolerated soft diet very well so we are discharging home with PPI twice daily.  Advised to not to take blood thinners and NSAIDs.  2.Iron deficiency anemia -Following with hematology oncologist -Hgb stable at10.1.Receiving Venofer at the cancer center.Iron saturation5%, ferritin 3  3.Liver cirrhosis-patient with a history of portal vein thrombosis and esophageal varices banded -Recheck CMP now -Gram-negative coverage withceftriaxonedue to his cirrhosis and GI bleed  4.Thrombocytopenia likely secondary to cirrhosis - continue to monitor for active bleed and infection  5.Alcoholic gastritis/ulcer -Continue Protonix drip as above  6.DVT prophylaxis - Hold anti-coagulation for thrombocytopenia / pending procedure / active bleed /  7.  Active smoker Tobacco cessation counseling is done for 4 minutes and offered nicotine patch.   DISCHARGE CONDITIONS:   Stable.  CONSULTS OBTAINED:  Treatment Team:  Virgel Manifold, MD  DRUG ALLERGIES:   Allergies  Allergen Reactions  . Lorazepam Other (See Comments)    "made me anxious and mean"   . Iron Nausea Only    DISCHARGE MEDICATIONS:   Allergies as of 04/07/2019      Reactions   Lorazepam Other (See Comments)   "made me anxious and mean"   Iron Nausea Only      Medication List    STOP taking these medications   aspirin 325 MG tablet     TAKE these medications   Ferrex 150 150 MG  capsule Generic drug: iron polysaccharides TAKE 1 CAPSULE BY MOUTH EVERY DAY   loratadine 10 MG tablet Commonly known as: CLARITIN Take 10 mg by mouth daily.   pantoprazole 40 MG tablet Commonly known as: Protonix Take 1 tablet (40 mg total) by mouth 2 (two) times daily before a meal.   propranolol 20 MG tablet Commonly known as: INDERAL TAKE 1 TABLET BY MOUTH TWICE A DAY        DISCHARGE  INSTRUCTIONS:    Follow with GI clinic in 2 weeks  If you experience worsening of your admission symptoms, develop shortness of breath, life threatening emergency, suicidal or homicidal thoughts you must seek medical attention immediately by calling 911 or calling your MD immediately  if symptoms less severe.  You Must read complete instructions/literature along with all the possible adverse reactions/side effects for all the Medicines you take and that have been prescribed to you. Take any new Medicines after you have completely understood and accept all the possible adverse reactions/side effects.   Please note  You were cared for by a hospitalist during your hospital stay. If you have any questions about your discharge medications or the care you received while you were in the hospital after you are discharged, you can call the unit and asked to speak with the hospitalist on call if the hospitalist that took care of you is not available. Once you are discharged, your primary care physician will handle any further medical issues. Please note that NO REFILLS for any discharge medications will be authorized once you are discharged, as it is imperative that you return to your primary care physician (or establish a relationship with a primary care physician if you do not have one) for your aftercare needs so that they can reassess your need for medications and monitor your lab values.    Today   CHIEF COMPLAINT:  No chief complaint on file.   HISTORY OF PRESENT ILLNESS:  Manuel Jimenez  is a 58 y.o. male with a known history of iron deficiency anemia secondary to GI bleed, thrombocytopenia, liver mass, hypertension,portal vein thrombosis, liver cirrhosis, EtOH abuse, alcoholic gastritis, portal hypertensive gastropathy, and esophageal varices banded presenting as direct admit from the cancer center with GI bleed.  Patient was seen at the cancer center today for symptomatic management of  anemia.  Per oncology notes, patient had labs drawn at [facility] clinic in anticipation of seeing Dr. Mike Gip next week.  During that visit he reported black stool to the nursing staff.  His labs on 6/2 revealed hemoglobin of 10.7 and platelets 76 with a baseline CMP.  He had repeat labs yesterday which showed hemoglobin 7.9 significant drop from 10.7 in just a day.  Patient's  significant other reports that patient has been taking aspirin over the past few days for aches and pains.  He also report black tarry stools for the past 2 to 3 weeks and thought was likely related to iron supplements that he is taking for his iron deficiency anemia.  He denies nausea or vomiting, shortness of breath, chest pain or palpitation, diarrhea, fevers or chills or any other associated GI symptoms.  Given his drop in hemoglobin concerning for possible GI bleed hospitalist were contacted to admit for further evaluation.  On arrival to the unit, he was afebrile with blood pressure 118/66 mm Hg and pulse rate 58/ beats/min. There were no focal neurological deficits; he was alert and oriented x 4.  Patient significant other currently at the bedside.  VITAL SIGNS:  Blood pressure (!) 144/69, pulse (!) 59, temperature 98.5 F (36.9 C), temperature source Oral, resp. rate 15, height 5\' 11"  (1.803 m), weight 59.8 kg, SpO2 94 %.  I/O:    Intake/Output Summary (Last 24 hours) at 04/07/2019 1225 Last data filed at 04/07/2019 0800 Gross per 24 hour  Intake 1349.22 ml  Output -  Net 1349.22 ml    PHYSICAL EXAMINATION:  GENERAL:  58 y.o.-year-old patient lying in the bed with no acute distress.  EYES: Pupils equal, round, reactive to light and accommodation. No scleral icterus. Extraocular muscles intact.  HEENT: Head atraumatic, normocephalic. Oropharynx and nasopharynx clear.  NECK:  Supple, no jugular venous distention. No thyroid enlargement, no tenderness.  LUNGS: Normal breath sounds bilaterally, no wheezing,  rales,rhonchi or crepitation. No use of accessory muscles of respiration.  CARDIOVASCULAR: S1, S2 normal. No murmurs, rubs, or gallops.  ABDOMEN: Soft, non-tender, non-distended. Bowel sounds present. No organomegaly or mass.  EXTREMITIES: No pedal edema, cyanosis, or clubbing.  NEUROLOGIC: Cranial nerves II through XII are intact. Muscle strength 5/5 in all extremities. Sensation intact. Gait not checked.  PSYCHIATRIC: The patient is alert and oriented x 3.  SKIN: No obvious rash, lesion, or ulcer.   DATA REVIEW:   CBC Recent Labs  Lab 04/07/19 0544  WBC 4.3  HGB 8.5*  HCT 28.3*  PLT 54*    Chemistries  Recent Labs  Lab 04/04/19 1348 04/05/19 0607  NA 141 142  K 3.6 3.9  CL 110 113*  CO2 22 23  GLUCOSE 88 87  BUN 15 9  CREATININE 0.58* 0.55*  CALCIUM 8.5* 8.2*  AST 14*  --   ALT 11  --   ALKPHOS 58  --   BILITOT 0.4  --     Cardiac Enzymes No results for input(s): TROPONINI in the last 168 hours.  Microbiology Results  Results for orders placed or performed during the hospital encounter of 04/04/19  SARS CORONAVIRUS 2 (TAT 6-24 HRS) Nasopharyngeal Nasopharyngeal Swab     Status: None   Collection Time: 04/04/19  3:34 PM   Specimen: Nasopharyngeal Swab  Result Value Ref Range Status   SARS Coronavirus 2 NEGATIVE NEGATIVE Final    Comment: (NOTE) SARS-CoV-2 target nucleic acids are NOT DETECTED. The SARS-CoV-2 RNA is generally detectable in upper and lower respiratory specimens during the acute phase of infection. Negative results do not preclude SARS-CoV-2 infection, do not rule out co-infections with other pathogens, and should not be used as the sole basis for treatment or other patient management decisions. Negative results must be combined with clinical observations, patient history, and epidemiological information. The expected result is Negative. Fact Sheet for Patients: SugarRoll.be Fact Sheet for Healthcare  Providers: https://www.woods-mathews.com/ This test is not yet approved or cleared by the [place] FDA and  has been authorized for detection and/or diagnosis of SARS-CoV-2 by FDA under an Emergency Use Authorization (EUA). This EUA will remain  in effect (meaning this test can be used) for the duration of the COVID-19 declaration under Section 56 4(b)(1) of the Act, 21 U.S.C. section 360bbb-3(b)(1), unless the authorization is terminated or revoked sooner. Performed at [facility], [place] [address] [place], [place] [address]     RADIOLOGY:  No results found.  EKG:   Orders placed or performed during the hospital encounter of 04/20/18  . ED EKG  . ED EKG  . EKG      Management plans discussed with the patient, family and  they are in agreement.  CODE STATUS:     Code Status Orders  (From admission, onward)         Start     Ordered   04/04/19 1335  Full code  Continuous     04/04/19 1336        Code Status History    Date Active Date Inactive Code Status Order ID Comments User Context   04/20/2018 2241 04/22/2018 1608 Full Code [id number]  Lance Coon, MD Inpatient   05/06/2015 2219 05/14/2015 1942 Full Code [id number]  Hower, Aaron Mose, MD ED   Advance Care Planning Activity      TOTAL TIME TAKING CARE OF THIS PATIENT: 35 minutes.    [name] M.D on 04/07/2019 at 12:25 PM  Between 7am to 6pm - Pager - [phone]  After 6pm go to [email or web address] - password [email or web address] [facility]  Office  [phone]  CC: Primary care physician; [name], DO   Note: This dictation was prepared with Dragon dictation along with smaller phrase technology. Any transcriptional errors that result from this process are unintentional.

## 2019-04-09 ENCOUNTER — Ambulatory Visit: Payer: 59

## 2019-04-10 LAB — AFP TUMOR MARKER: AFP, Serum, Tumor Marker: 2.1 ng/mL (ref 0.0–8.3)

## 2019-04-11 NOTE — Progress Notes (Signed)
[facility]  [address] [place], [place] [address] Phone: [phone]  Fax: [phone]   Clinic Day:  04/12/2019  Referring physician: [name] *  Chief Complaint: Manuel Jimenez is a 58 y.o. male with iron deficiency anemia secondary to GI bleed, thrombocytopenia, and liver mass who is seen for 3 month assessment.  HPI: The patient was last seen in the hematology clinic on 01/02/2019. At that time, he denied any complaints.  He denied any bleeding.  Exam revealed palpable splenomegaly.  RUQ ultrasound on 01/08/2019 revealed echogenic foci in the gallbladder ranging from 2-7 mm.  Structures probably represented polyps.  Previously measured 7 mm polyp on the past study measured 5 mm.  Gall bladder wall was somewhat thickened.  The appearance of the liver is c/w known cirrhosis.  There were no focal liver lesions.  There was trace ascites.  There was an area of nonocclusive thrombus in the main and left portal vein regions.  Flow in the portal vein was in the anatomic direction.  He was seen in the Symptom Management clinic by Beckey Rutter, NP on 04/04/2019 after his hemoglobin had dropped from 10.7 to 7.9; he reported melena x 2-3 weeks. Ferritin was 3.  Dr. Marius Ditch recommended admission due to concern for an upper GI bleed.   He was admitted to [facility] from 04/04/2019 - 04/07/2019. He received one unit of PRBCs.  He received Venofer 200 mg on 04/04/2019.  Discharge counts on 04/07/2019 included a hematocrit of 28.3, hemoglobin 8.5, platelets 54,000 and white count 4300.  EGD on 04/05/2019 by Dr Bonna Gains revealed recently bleeding grade II esophageal varices, banded. There was a non-bleeding gastric ulcer with a clean ulcer base. There was portal hypertensive gastropathy. Erythematous mucosa in the antrum. There was a non-bleeding duodenal ulcer with a clean ulcer base ([place] Class III).  Normal second portion of the duodenum. No specimens  collected. He was discharged to follow up in GI.   During the interim, he has felt "fine".  He notes that his stool has been normal.  He denies any melena or hematochezia.  Eating has improved.  He has a GI appointment on 05/13/2019.   Past Medical History:  Diagnosis Date  . Anemia   . Cancer ([place])   . Cirrhosis ([place])   . Colon polyps   . Constipation   . Diarrhea   . Hemorrhoids   . Hypertension    in past  . Portal vein thrombosis    see chart review 06/05/15  . Shingles 09/2016  . Stomach ulcer   . Weight loss     Past Surgical History:  Procedure Laterality Date  . COLONOSCOPY N/A 06/17/2015   Procedure: COLONOSCOPY;  Surgeon: Hulen Luster, MD;  Location: [place];  Service: Gastroenterology;  Laterality: N/A;  . COLONOSCOPY WITH PROPOFOL N/A 02/02/2017   Procedure: COLONOSCOPY WITH PROPOFOL;  Surgeon: [name], MD;  Location: [place];  Service: Endoscopy;  Laterality: N/A;  . ENTEROSCOPY N/A 04/21/2018   Procedure: ENTEROSCOPY;  Surgeon: Lin Landsman, MD;  Location: [facility] ENDOSCOPY;  Service: Gastroenterology;  Laterality: N/A;  . ESOPHAGOGASTRODUODENOSCOPY N/A 05/14/2015   Procedure: ESOPHAGOGASTRODUODENOSCOPY (EGD);  Surgeon: Hulen Luster, MD;  Location: [facility] ENDOSCOPY;  Service: Endoscopy;  Laterality: N/A;  . ESOPHAGOGASTRODUODENOSCOPY N/A 04/21/2018   Procedure: ESOPHAGOGASTRODUODENOSCOPY (EGD);  Surgeon: Lin Landsman, MD;  Location: [facility] ENDOSCOPY;  Service: Gastroenterology;  Laterality: N/A;  . ESOPHAGOGASTRODUODENOSCOPY N/A 04/05/2019   Procedure: ESOPHAGOGASTRODUODENOSCOPY (EGD);  Surgeon: Virgel Manifold, MD;  Location: [facility] ENDOSCOPY;  Service: Endoscopy;  Laterality: N/A;  . ESOPHAGOGASTRODUODENOSCOPY (EGD) WITH PROPOFOL N/A 02/02/2017   Procedure: ESOPHAGOGASTRODUODENOSCOPY (EGD) WITH PROPOFOL;  Surgeon: [name], MD;  Location: [place];  Service: Endoscopy;  Laterality: N/A;  . FLEXIBLE SIGMOIDOSCOPY N/A  04/05/2019   Procedure: FLEXIBLE SIGMOIDOSCOPY;  Surgeon: Virgel Manifold, MD;  Location: [facility] ENDOSCOPY;  Service: Endoscopy;  Laterality: N/A;  . POLYPECTOMY  02/02/2017   Procedure: POLYPECTOMY;  Surgeon: [name], MD;  Location: [place];  Service: Endoscopy;;    Family History  Problem Relation Age of Onset  . CAD Father   . Heart attack Father     Social History:  reports that he has been smoking cigarettes. He has a 80.00 pack-year smoking history. He has never used smokeless tobacco. He reports that he does not drink alcohol or use drugs. He stopped drinking on 04/07/2015. He currently smokes about 1-1 1/2 ppd, and has since age 31, about 35 years. He denies any known exposure to radiation or toxins. 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  He lives in [place].  The patient is alone today.  Allergies:  Allergies  Allergen Reactions  . Lorazepam Other (See Comments)    "made me anxious and mean"   . Iron Nausea Only    Current Medications: Current Outpatient Medications  Medication Sig Dispense Refill  . FERREX 150 150 MG capsule TAKE 1 CAPSULE BY MOUTH EVERY DAY 30 capsule 2  . loratadine (CLARITIN) 10 MG tablet Take 10 mg by mouth daily.    . pantoprazole (PROTONIX) 40 MG tablet Take 1 tablet (40 mg total) by mouth 2 (two) times daily before a meal. 30 tablet 1  . propranolol (INDERAL) 20 MG tablet TAKE 1 TABLET BY MOUTH TWICE A DAY (Patient not taking: Reported on 04/12/2019) 60 tablet 0   No current facility-administered medications for this visit.     Review of Systems  Constitutional: Positive for weight loss (3 pounds). Negative for chills, diaphoresis, fever and malaise/fatigue.       Feels "fine".  HENT: Positive for congestion (little). Negative for ear pain, hearing loss, nosebleeds, sinus pain and sore throat.   Eyes: Negative.  Negative for blurred vision, double  vision and pain.  Respiratory: Negative.  Negative for cough, sputum production, shortness of breath and wheezing.   Cardiovascular: Negative.  Negative for chest pain, palpitations, claudication, leg swelling and PND.  Gastrointestinal: Negative.  Negative for abdominal pain, blood in stool, constipation, diarrhea, heartburn, melena, nausea and vomiting.  Genitourinary: Negative.  Negative for dysuria, frequency and urgency.  Musculoskeletal: Negative.  Negative for back pain, joint pain and myalgias.  Skin: Negative.  Negative for rash.  Neurological: Negative.  Negative for dizziness, tingling, sensory change, speech change, focal weakness, weakness and headaches.  Endo/Heme/Allergies: Negative.  Does not bruise/bleed easily.  Psychiatric/Behavioral: Negative.  Negative for depression and memory loss. The patient is not nervous/anxious and does not have insomnia.   All other systems reviewed and are negative.  Performance status (ECOG): 1  Vitals Blood pressure 127/78, pulse 69, temperature (!) 96.7 F (35.9 C), temperature source Tympanic, resp. rate 18, height 5\' 11"  (1.803 m), weight 128 lb 12 oz (58.4 kg), SpO2 100 %.   Physical Exam  Constitutional: He is oriented to person, place, and time. He appears well-developed and well-nourished. No distress.  HENT:  Head: Normocephalic and atraumatic.  Mouth/Throat: Oropharynx is clear and moist. No oropharyngeal exudate.  Short gray hair.  Mask.  Eyes: Pupils are equal, round, and reactive to light. Conjunctivae and EOM are normal. No scleral icterus.  Blue eyes.  Neck: Neck supple.  Cardiovascular: Normal rate, regular rhythm and normal heart sounds.  No murmur heard. Pulmonary/Chest: Effort normal and breath sounds normal. No respiratory distress. He has no wheezes.  Abdominal: Soft. Bowel sounds are normal. He exhibits no distension and no mass. There is splenomegaly (2 finger breadths below the left costal margin). There is no  abdominal tenderness. There is no rebound and no guarding.  Musculoskeletal: Normal range of motion.        General: No edema.  Lymphadenopathy:    He has no cervical adenopathy.    He has no axillary adenopathy.       Right: No supraclavicular adenopathy present.       Left: No supraclavicular adenopathy present.  Neurological: He is alert and oriented to person, place, and time.  Skin: Skin is warm and dry. No rash noted. He is not diaphoretic. No erythema. No pallor.  Psychiatric: He has a normal mood and affect. His behavior is normal. Judgment and thought content normal.  Nursing note and vitals reviewed.   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 There was splenomegaly and recanalization of periumbilical veins, consistent with portal venous hypertension. There was hepatomegaly, with several subtle ill-defined lesions in the lateral segment of the left hepatic lobe measuring up to 2.5 cm. There were findings suspicious for right colonic diverticulitis. There was ill-defined soft tissue density with central fluid and gas collection within the right lower quadrant mesentery. 06/11/2015:  Liver MRI revealed mild cirrhosis, without suspicious liver lesion. The lateral segment left liver lobe abnormality on CT was likely due to altered perfusion and possibly focal steatosis. There was nonocclusive thrombus in the right and main portal veins and occlusive thrombus in the superior mesenteric vein. There was gastric under-distention, and apparent wall thickening could be secondary. Gastritis could not be excluded. 01/25/2017:  Right upper quadrant ultrasound revealed cirrhotic changes within the liver without evidence of discrete masses nor ascites. There was nonocclusive thrombus in the main portal vein. There was a gallbladder polyp but no evidence of stones or acute cholecystitis. There was mild gallbladder  wall thickening to 5.5 mm. 04/21/2018:  Liver ultrasound and doppler on 04/21/2018 revealed persistent nonocclusive thrombus in the distal portion of the main portal vein.  The amount of thrombus and degree of distal portal vein distension appears less prominent compared to the prior ultrasound in 12/2016.   Appointment on 04/12/2019  Component Date Value Ref Range Status  . WBC 04/12/2019 4.6  4.0 - 10.5 K/uL Final  . RBC 04/12/2019 3.75* 4.22 - 5.81 MIL/uL Final  . Hemoglobin 04/12/2019 9.5* 13.0 - 17.0 g/dL Final  . HCT 04/12/2019 31.1* 39.0 - 52.0 % Final  . MCV 04/12/2019 82.9  80.0 - 100.0 fL Final  . MCH 04/12/2019 25.3* 26.0 - 34.0 pg Final  . MCHC 04/12/2019 30.5  30.0 - 36.0 g/dL Final  . RDW 04/12/2019 19.0* 11.5 - 15.5 % Final  . Platelets 04/12/2019 53* 150 - 400 K/uL Final   Comment: Immature Platelet Fraction may be clinically indicated, consider ordering this additional test [id number]   . nRBC 04/12/2019 0.0  0.0 - 0.2 % Final  . Neutrophils Relative %  04/12/2019 76  % Final  . Neutro Abs 04/12/2019 3.5  1.7 - 7.7 K/uL Final  . Lymphocytes Relative 04/12/2019 11  % Final  . Lymphs Abs 04/12/2019 0.5* 0.7 - 4.0 K/uL Final  . Monocytes Relative 04/12/2019 11  % Final  . Monocytes Absolute 04/12/2019 0.5  0.1 - 1.0 K/uL Final  . Eosinophils Relative 04/12/2019 1  % Final  . Eosinophils Absolute 04/12/2019 0.1  0.0 - 0.5 K/uL Final  . Basophils Relative 04/12/2019 1  % Final  . Basophils Absolute 04/12/2019 0.0  0.0 - 0.1 K/uL Final  . Immature Granulocytes 04/12/2019 0  % Final  . Abs Immature Granulocytes 04/12/2019 0.02  0.00 - 0.07 K/uL Final   Performed at [facility], [address] [place], [place] [address]    Assessment:  Manuel Jimenez is a 58 y.o. 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  Platelet count has ranged between 68,000 and 127,000 since 05/2015.     He received Feraheme on 12/14/2016, 12/21/2016, 06/16/2017, 06/30/2017, 10/13/2017, 10/19/2017, 01/19/2018, 01/26/2018, 09/14/2018, and 09/21/2018.  He received Venofer on 04/04/2019.  He received 4 units of PRBCs in 05/2015, 2 units in 04/2018, and 1 unit on 04/04/2019.  Ferritin has been followed: 19 on 04/18/2016, 5 on 11/21/2016, 50 on 01/31/2017, 6 on 06/14/2017, 10 on 01/18/2018, 7 on 04/20/2018, 126 on 04/27/2018, 11 on 09/07/2018, 6 on 01/01/2019, and 19 on 04/12/2019.   Liver ultrasound and doppler on 04/21/2018 revealed persistent nonocclusive thrombus in the distal portion of the main portal vein.  The amount of thrombus and degree of distal portal vein distension appears less prominent compared to the prior ultrasound in 12/2016.  RUQ ultrasound on 01/08/2019 revealed echogenic foci in the gallbladder ranging from 2-7 mm.  Structures probably represented polyps.  Previously measured 7 mm polyp on the past study measured 5 mm.  Gall bladder wall was somewhat thickened.  The appearance of the liver is c/w known cirrhosis.  There were no focal liver lesions.  There was trace ascites.  There was an area of nonocclusive thrombus in the main and left portal vein regions.  Flow in the portal vein was in the anatomic direction.  EGD on 02/02/2017 revealed grade I esophageal varices and portal hypertensive gastropathy. The examined duodenum was normal.  EGD on 04/21/2018 revealed normal duodenal bulb and second portion of the duodenum. There was portal hypertensive gastropathy and non-bleeding gastric ulcers with a clean ulcer base.  There were recently bleeding large (> 5 mm) esophageal varices.  EGD on 04/05/2019 revealed recently bleeding grade II esophageal varices, banded. There was a non-bleeding gastric ulcer with a clean ulcer base. There was portal hypertensive gastropathy.  There was eythematous mucosa in the antrum. There was a non-bleeding duodenal ulcer with a clean ulcer base  ([place] Class III).    Colonoscopy on 02/02/2017 revealed one 3 mm polyp in the descending colon, one 10 mm polyp in the sigmoid colon, and four 5 to 6 mm polyps in the sigmoid colon. There was diverticulosis in the sigmoid colon and non-bleeding internal hemorrhoids. Pathology revealed hyperplastic polyp in the descending colon and a hyperplastic polyp, tubulovillous adenoma and 2 tubular adenomas in the sigmoid colon.   AFP has been followed:  3.3 on 06/05/2015, 1.8 on 01/19/2017, 2.9 on 10/12/2017, 1.7 on 04/20/2018, 1.4 on 04/21/2018, 1.3 on 04/22/2018, and 2.1 on 04/03/2019.  He was admitted to [facility] from 04/04/2019 -  04/07/2019 with an upper GI bleed.  He received one unit of PRBCs.  He received Venofer 200 mg on 04/04/2019.   Symptomatically, he feels fine.  Stool is normal color.  Exam is stable.  Hemoglobin is 9.5.  Plan: 1.   Labs today:  CBC with diff, ferritin. 2.   Iron deficiency anemia             Hematocrit 31.1.  Hemoglobin 9.5.  MCV 82.9.    Ferritin 19.    Review interval hospitalization and EGD.  Clinically no Gi bleeding.  Discuss plan to replete iron stores.  Venofer today.  Cancel next week's Venofer (he is out of town).  Venofer on 09/25 and 05/03/2019. 3.   Thrombocytopenia             Platelet count 53,000.             Etiology secondary to underlying liver disease and splenomegaly.    Continue to monitor.   4.   Portal vein thrombosis             Nonocclusive portal vein thrombus documented in 06/2015.             Liver ultrasound in 04/2018 revealed persistent nonocclusive thrombus in the distal portion of the main portal vein.             No plans for anticoagulation given underlying cirrhosis, thrombocytopenia and history of bleeding.             Continue to monitor. 5.   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             He has not had any alcohol since 2016.             Child class A.  Low MELD score.             Viral hepatitis  panel was negative.             He requires hepatocellular screening every 6 months with AFP and liver imaging (RUQ ultrasound).             RUQ ultrasound on 01/08/2019 revealed no focal liver lesions, but apparent gallbladder polyps, gallbladder wall thickening, trace ascites, and nonocclusive portal vein thrombus.             Patient is followed by Dr. Marius Ditch and Dr Bonna Gains. 6.   Health maintenance             Patient has an 80-pack-year smoking history.    Follow-up with Melinda Crutch, RN regarding low-dose chest CT program. 7.   Add CBC on 04/26/2019. 8.   RTC on 05/31/2019 for MD assessment, labs (CBC with diff, ferritin- day before), and +/- Venofer.  I discussed the assessment and treatment plan with the patient.  The patient was provided an opportunity to ask questions and all were answered.  The patient agreed with the plan and demonstrated an understanding of the instructions.  The patient was advised to call back if the symptoms worsen or if the condition fails to improve as anticipated.    Lequita Asal, MD, PhD    04/12/2019, 2:19 PM

## 2019-04-12 ENCOUNTER — Inpatient Hospital Stay: Payer: 59

## 2019-04-12 ENCOUNTER — Ambulatory Visit: Payer: 59

## 2019-04-12 ENCOUNTER — Encounter: Payer: Self-pay | Admitting: Hematology and Oncology

## 2019-04-12 ENCOUNTER — Other Ambulatory Visit: Payer: Self-pay

## 2019-04-12 ENCOUNTER — Inpatient Hospital Stay (HOSPITAL_BASED_OUTPATIENT_CLINIC_OR_DEPARTMENT_OTHER): Payer: 59 | Admitting: Hematology and Oncology

## 2019-04-12 VITALS — BP 127/78 | HR 69 | Temp 96.7°F | Resp 18 | Ht 71.0 in | Wt 128.7 lb

## 2019-04-12 VITALS — BP 118/72 | HR 60 | Resp 18

## 2019-04-12 DIAGNOSIS — I81 Portal vein thrombosis: Secondary | ICD-10-CM | POA: Diagnosis not present

## 2019-04-12 DIAGNOSIS — D500 Iron deficiency anemia secondary to blood loss (chronic): Secondary | ICD-10-CM | POA: Diagnosis present

## 2019-04-12 DIAGNOSIS — D696 Thrombocytopenia, unspecified: Secondary | ICD-10-CM

## 2019-04-12 DIAGNOSIS — R634 Abnormal weight loss: Secondary | ICD-10-CM | POA: Diagnosis not present

## 2019-04-12 DIAGNOSIS — K7030 Alcoholic cirrhosis of liver without ascites: Secondary | ICD-10-CM | POA: Diagnosis not present

## 2019-04-12 DIAGNOSIS — K766 Portal hypertension: Secondary | ICD-10-CM | POA: Diagnosis not present

## 2019-04-12 DIAGNOSIS — Z79899 Other long term (current) drug therapy: Secondary | ICD-10-CM | POA: Diagnosis not present

## 2019-04-12 DIAGNOSIS — R162 Hepatomegaly with splenomegaly, not elsewhere classified: Secondary | ICD-10-CM | POA: Diagnosis not present

## 2019-04-12 DIAGNOSIS — I10 Essential (primary) hypertension: Secondary | ICD-10-CM | POA: Diagnosis not present

## 2019-04-12 DIAGNOSIS — F17210 Nicotine dependence, cigarettes, uncomplicated: Secondary | ICD-10-CM | POA: Diagnosis not present

## 2019-04-12 DIAGNOSIS — K921 Melena: Secondary | ICD-10-CM

## 2019-04-12 LAB — CBC WITH DIFFERENTIAL/PLATELET
Abs Immature Granulocytes: 0.02 10*3/uL (ref 0.00–0.07)
Basophils Absolute: 0 10*3/uL (ref 0.0–0.1)
Basophils Relative: 1 %
Eosinophils Absolute: 0.1 10*3/uL (ref 0.0–0.5)
Eosinophils Relative: 1 %
HCT: 31.1 % — ABNORMAL LOW (ref 39.0–52.0)
Hemoglobin: 9.5 g/dL — ABNORMAL LOW (ref 13.0–17.0)
Immature Granulocytes: 0 %
Lymphocytes Relative: 11 %
Lymphs Abs: 0.5 10*3/uL — ABNORMAL LOW (ref 0.7–4.0)
MCH: 25.3 pg — ABNORMAL LOW (ref 26.0–34.0)
MCHC: 30.5 g/dL (ref 30.0–36.0)
MCV: 82.9 fL (ref 80.0–100.0)
Monocytes Absolute: 0.5 10*3/uL (ref 0.1–1.0)
Monocytes Relative: 11 %
Neutro Abs: 3.5 10*3/uL (ref 1.7–7.7)
Neutrophils Relative %: 76 %
Platelets: 53 10*3/uL — ABNORMAL LOW (ref 150–400)
RBC: 3.75 MIL/uL — ABNORMAL LOW (ref 4.22–5.81)
RDW: 19 % — ABNORMAL HIGH (ref 11.5–15.5)
WBC: 4.6 10*3/uL (ref 4.0–10.5)
nRBC: 0 % (ref 0.0–0.2)

## 2019-04-12 LAB — FERRITIN: Ferritin: 19 ng/mL — ABNORMAL LOW (ref 24–336)

## 2019-04-12 MED ORDER — SODIUM CHLORIDE 0.9 % IV SOLN
Freq: Once | INTRAVENOUS | Status: AC
  Administered 2019-04-12: 15:00:00 via INTRAVENOUS
  Filled 2019-04-12: qty 250

## 2019-04-12 MED ORDER — IRON SUCROSE 20 MG/ML IV SOLN
200.0000 mg | Freq: Once | INTRAVENOUS | Status: AC
  Administered 2019-04-12: 15:00:00 200 mg via INTRAVENOUS

## 2019-04-12 MED ORDER — SODIUM CHLORIDE 0.9 % IV SOLN: 200.0000 mg | Freq: Once | INTRAVENOUS | Status: DC

## 2019-04-12 NOTE — Patient Instructions (Signed)

## 2019-04-12 NOTE — Progress Notes (Signed)
No new changes noted today 

## 2019-04-19 ENCOUNTER — Inpatient Hospital Stay: Payer: 59

## 2019-04-25 ENCOUNTER — Other Ambulatory Visit: Payer: Self-pay

## 2019-04-26 ENCOUNTER — Inpatient Hospital Stay: Payer: 59

## 2019-04-26 ENCOUNTER — Other Ambulatory Visit: Payer: 59

## 2019-04-26 VITALS — BP 158/75 | HR 64 | Temp 96.3°F | Resp 18

## 2019-04-26 DIAGNOSIS — D500 Iron deficiency anemia secondary to blood loss (chronic): Secondary | ICD-10-CM

## 2019-04-26 LAB — CBC
HCT: 36 % — ABNORMAL LOW (ref 39.0–52.0)
Hemoglobin: 10.8 g/dL — ABNORMAL LOW (ref 13.0–17.0)
MCH: 24.5 pg — ABNORMAL LOW (ref 26.0–34.0)
MCHC: 30 g/dL (ref 30.0–36.0)
MCV: 81.6 fL (ref 80.0–100.0)
Platelets: 75 10*3/uL — ABNORMAL LOW (ref 150–400)
RBC: 4.41 MIL/uL (ref 4.22–5.81)
RDW: 19.1 % — ABNORMAL HIGH (ref 11.5–15.5)
WBC: 6.7 10*3/uL (ref 4.0–10.5)
nRBC: 0 % (ref 0.0–0.2)

## 2019-04-26 MED ORDER — IRON SUCROSE 20 MG/ML IV SOLN
200.0000 mg | Freq: Once | INTRAVENOUS | Status: AC
  Administered 2019-04-26: 10:00:00 200 mg via INTRAVENOUS

## 2019-04-26 MED ORDER — SODIUM CHLORIDE 0.9 % IV SOLN
Freq: Once | INTRAVENOUS | Status: AC
  Administered 2019-04-26: 10:00:00 via INTRAVENOUS
  Filled 2019-04-26: qty 250

## 2019-04-26 MED ORDER — SODIUM CHLORIDE 0.9 % IV SOLN: 200.0000 mg | Freq: Once | INTRAVENOUS | Status: DC

## 2019-04-26 MED ORDER — IRON SUCROSE 20 MG/ML IV SOLN
INTRAVENOUS | Status: AC
  Filled 2019-04-26: qty 10

## 2019-04-26 NOTE — Patient Instructions (Signed)

## 2019-05-03 ENCOUNTER — Other Ambulatory Visit: Payer: Self-pay

## 2019-05-03 ENCOUNTER — Inpatient Hospital Stay: Payer: 59 | Attending: Hematology and Oncology

## 2019-05-03 VITALS — BP 166/71 | HR 71 | Temp 96.5°F | Resp 20

## 2019-05-03 DIAGNOSIS — D500 Iron deficiency anemia secondary to blood loss (chronic): Secondary | ICD-10-CM

## 2019-05-03 DIAGNOSIS — D508 Other iron deficiency anemias: Secondary | ICD-10-CM | POA: Diagnosis not present

## 2019-05-03 MED ORDER — SODIUM CHLORIDE 0.9 % IV SOLN
Freq: Once | INTRAVENOUS | Status: AC
  Administered 2019-05-03: 14:00:00 via INTRAVENOUS
  Filled 2019-05-03: qty 250

## 2019-05-03 MED ORDER — IRON SUCROSE 20 MG/ML IV SOLN
200.0000 mg | Freq: Once | INTRAVENOUS | Status: AC
  Administered 2019-05-03: 200 mg via INTRAVENOUS
  Filled 2019-05-03: qty 10

## 2019-05-03 MED ORDER — SODIUM CHLORIDE 0.9 % IV SOLN: 200.0000 mg | Freq: Once | INTRAVENOUS | Status: DC

## 2019-05-13 ENCOUNTER — Other Ambulatory Visit: Payer: Self-pay

## 2019-05-13 ENCOUNTER — Ambulatory Visit: Payer: 59 | Admitting: Gastroenterology

## 2019-05-13 ENCOUNTER — Encounter: Payer: Self-pay | Admitting: Gastroenterology

## 2019-05-13 VITALS — BP 188/91 | HR 72 | Temp 98.6°F | Resp 17 | Ht 71.0 in | Wt 128.4 lb

## 2019-05-13 DIAGNOSIS — K279 Peptic ulcer, site unspecified, unspecified as acute or chronic, without hemorrhage or perforation: Secondary | ICD-10-CM | POA: Diagnosis not present

## 2019-05-13 DIAGNOSIS — I8500 Esophageal varices without bleeding: Secondary | ICD-10-CM

## 2019-05-13 DIAGNOSIS — I8510 Secondary esophageal varices without bleeding: Secondary | ICD-10-CM | POA: Diagnosis not present

## 2019-05-13 DIAGNOSIS — K7030 Alcoholic cirrhosis of liver without ascites: Secondary | ICD-10-CM

## 2019-05-13 DIAGNOSIS — Z23 Encounter for immunization: Secondary | ICD-10-CM

## 2019-05-13 MED ORDER — OMEPRAZOLE 40 MG PO CPDR: 40.0000 mg | DELAYED_RELEASE_CAPSULE | Freq: Every day | ORAL | 0 refills | Status: AC

## 2019-05-13 MED ORDER — PROPRANOLOL HCL 20 MG PO TABS: 20.0000 mg | ORAL_TABLET | Freq: Two times a day (BID) | ORAL | 1 refills | Status: DC

## 2019-05-13 NOTE — Progress Notes (Signed)
Manuel Darby, MD [address]  [place]  [place], [place] [address]  Main: [phone]  Fax: [phone]    Gastroenterology Consultation  Referring Provider:     [name] * Primary Care Physician:  [name], DO Primary Gastroenterologist:  [name] Reason for Consultation:     Alcoholic cirrhosis        HPI:   Manuel Jimenez is a 58 y.o. male referred by [name], [name], DO  for consultation & management of alcoholic cirrhosis.  Patient recently admitted to [facility], last week secondary to 2 days history of black tarry stools, severe symptomatic anemia.  He has history of chronic iron deficiency anemia, chronic thrombocytopenia his hemoglobin on admission was 6.5. Patient received 2 units of PRBCs, hemoglobin is 7.4. Hemoglobin prior to admission was 12.6. Last normal was 15 in 01/2017. Patient has history of chronic iron deficiency anemia and based on the prior endoscopic evaluation which was EGD and colonoscopy, he was found to have moderate portal hypertensive gastropathy and adenomas in the colon. He has been seeing Dr. [name] receiving parenteral iron therapy. His ferritin levels have been chronically low since 04/2016 Other than generalized weakness, patient denies chest pain, abdominal pain, ascites, swelling of legs, hematemesis, f/c. Patient was started on pantoprazole drip, octreotide drip. I performed his EGD which revealed large esophageal varix with stigmata of recent bleeding.  I performed one banding.  He received 2 doses of IV iron in the hospital His last drink of ETOH was in 04/2015  Interval summary: Patient reports that he has been doing well since discharge.  He denies any symptoms of anemia.  He reports that his stools are brown.  He is taking oral iron which is causing some stomach discomfort.  Follow-up visit 05/13/2019 Patient is here as a hospital follow-up for upper GI bleed, severe symptomatic anemia.  He  has not seen me since his last visit in 04/2018.  He said he has been busy traveling due to his job.  Patient was admitted to [facility] in 04/2019 from [name] office when she contacted me because of drop in hemoglobin and melena.  Given his history of alcohol cirrhosis, there was concern about variceal bleed.  Patient underwent upper endoscopy which revealed small gastric and duodenal ulcers which are clean-based as well as esophageal varices with red wale signs, ligation was performed with 4 bands.  Patient was not adherent to taking propranolol since his last visit with me. Since discharge, he reports brown stool, denies abdominal pain, appetite has improved.  He just ran out of Protonix and propranolol prescription that he was given at the time of discharge.  His hemoglobin improved to 10.8, 2 weeks ago since discharge.  Patient received parenteral iron on 9/11, 9/25 and 10/2.  I also recommended video capsule endoscopy during last visit which he did not undergo yet.  Patient is not drinking alcohol however he continues to smoke cigarettes, half pack per day, considering to start Chantix to quit smoking  NSAIDs: None  Antiplts/Anticoagulants/Anti thrombotics: None  GI Procedures:  EGD 04/05/2019 - Recently bleeding grade II esophageal varices. Completely eradicated. Banded. - Non-bleeding gastric ulcer with a clean ulcer base ([place] Class III). - Portal hypertensive gastropathy. - Erythematous mucosa in the antrum. - Non-bleeding duodenal ulcer with a clean ulcer base ([place]rest Class III). - Normal second portion of the duodenum. - No specimens collected.  EGD 04/21/2018 - Normal duodenal bulb and second portion of the duodenum.  Biopsied. - Portal hypertensive gastropathy. Biopsied. - Non-bleeding gastric ulcers with a clean ulcer base ([place]rest Class III). - Esophagogastric landmarks identified. - Recently bleeding large (> 5 mm) esophageal varices. Completely eradicated. Banded. DIAGNOSIS:   A. DUODENUM; COLD BIOPSY:  - DUODENOPATHY WITH VILLOUS BLUNTING.  - DECREASED NUMBER OF PLASMA CELLS, SEE COMMENT.  - NO INTRAEPITHELIAL LYMPHOCYTOSIS.   B. STOMACH; COLD BIOPSY:  - EDEMA.  - NEGATIVE FOR H. PYLORI, INTESTINAL METAPLASIA, DYSPLASIA, AND  MALIGNANCY.  EGD 02/02/2017 for iron deficiency anemia - Grade I esophageal varices. - moderate Portal hypertensive gastropathy. - Normal examined duodenum. - No specimens collected.  Colonoscopy 02/02/2017 - One 3 mm polyp in the descending colon, removed with a cold biopsy forceps. Resected and retrieved. Clip (MR conditional) was placed. - One 10 mm polyp in the sigmoid colon, removed with a hot snare. Resected and retrieved. Clip (MR conditional) was placed. - Four 5 to 6 mm polyps in the sigmoid colon, removed with a cold snare. Resected and retrieved. - Diverticulosis in the sigmoid colon. - Non-bleeding internal hemorrhoids.   EGD 05/14/2015 - Benign-appearing esophageal stricture. Dilated. - The examination was otherwise normal. - Portal hypertensive gastropathy. - The examination was otherwise normal. - Normal examined duodenum. - No specimens collected.  Colonoscopy 06/17/2015 - Diverticulosis in the ascending colon. - One small polyp in the ascending colon. Resected and retrieved. - One small polyp in the descending colon. Resected and retrieved. - One large polyp in the sigmoid colon. Resected and retrieved. - The examination was otherwise normal. - The examination was otherwise normal. - The examined portion of the ileum was normal. DIAGNOSIS:  A. COLON POLYP, ASCENDING; BIOPSY:  - TUBULAR ADENOMA, 2 FRAGMENTS.  - NEGATIVE FOR HIGH-GRADE DYSPLASIA AND MALIGNANCY.   B. COLON POLYP, DESCENDING; BIOPSY:  - COLONIC MUCOSA WITHIN NORMAL LIMITS.   C. COLON POLYP, SIGMOID; BIOPSY:  - TUBULOVILLOUS ADENOMA, FRAGMENTS.  - NEGATIVE FOR HIGH-GRADE DYSPLASIA AND MALIGNANCY.   Past Medical History:   Diagnosis Date  . Anemia   . Cancer ([place])   . Cirrhosis ([place])   . Colon polyps   . Constipation   . Diarrhea   . Hemorrhoids   . Hypertension    in past  . Portal vein thrombosis    see chart review 06/05/15  . Shingles 09/2016  . Stomach ulcer   . Weight loss     Past Surgical History:  Procedure Laterality Date  . COLONOSCOPY N/A 06/17/2015   Procedure: COLONOSCOPY;  Surgeon: Hulen Luster, MD;  Location: [place];  Service: Gastroenterology;  Laterality: N/A;  . COLONOSCOPY WITH PROPOFOL N/A 02/02/2017   Procedure: COLONOSCOPY WITH PROPOFOL;  Surgeon: [name], MD;  Location: [place];  Service: Endoscopy;  Laterality: N/A;  . ENTEROSCOPY N/A 04/21/2018   Procedure: ENTEROSCOPY;  Surgeon: Lin Landsman, MD;  Location: [facility] ENDOSCOPY;  Service: Gastroenterology;  Laterality: N/A;  . ESOPHAGOGASTRODUODENOSCOPY N/A 05/14/2015   Procedure: ESOPHAGOGASTRODUODENOSCOPY (EGD);  Surgeon: Hulen Luster, MD;  Location: [facility] ENDOSCOPY;  Service: Endoscopy;  Laterality: N/A;  . ESOPHAGOGASTRODUODENOSCOPY N/A 04/21/2018   Procedure: ESOPHAGOGASTRODUODENOSCOPY (EGD);  Surgeon: Lin Landsman, MD;  Location: [facility] ENDOSCOPY;  Service: Gastroenterology;  Laterality: N/A;  . ESOPHAGOGASTRODUODENOSCOPY N/A 04/05/2019   Procedure: ESOPHAGOGASTRODUODENOSCOPY (EGD);  Surgeon: Virgel Manifold, MD;  Location[facility] ENDOSCOPY;  Service: Endoscopy;  Laterality: N/A;  . ESOPHAGOGASTRODUODENOSCOPY (EGD) WITH PROPOFOL N/A 02/02/2017   Procedure: ESOPHAGOGASTRODUODENOSCOPY (EGD) WITH PROPOFOL;  Surgeon: [name], MD;  Location: [facility];  Service: Endoscopy;  Laterality: N/A;  . FLEXIBLE SIGMOIDOSCOPY N/A 04/05/2019   Procedure: FLEXIBLE SIGMOIDOSCOPY;  Surgeon: Virgel Manifold, MD;  Location: [facility] ENDOSCOPY;  Service: Endoscopy;  Laterality: N/A;  . POLYPECTOMY  02/02/2017   Procedure: POLYPECTOMY;  Surgeon: [name], MD;  Location: [place];  Service:  Endoscopy;;     Current Outpatient Medications:  .  FERREX 150 150 MG capsule, TAKE 1 CAPSULE BY MOUTH EVERY DAY, Disp: 30 capsule, Rfl: 2 .  loratadine (CLARITIN) 10 MG tablet, Take 10 mg by mouth daily., Disp: , Rfl:  .  pantoprazole (PROTONIX) 40 MG tablet, Take 1 tablet (40 mg total) by mouth 2 (two) times daily before a meal., Disp: 30 tablet, Rfl: 1 .  propranolol (INDERAL) 20 MG tablet, Take 1 tablet (20 mg total) by mouth 2 (two) times daily., Disp: 180 tablet, Rfl: 1 .  omeprazole (PRILOSEC) 40 MG capsule, Take 1 capsule (40 mg total) by mouth daily before breakfast., Disp: 90 capsule, Rfl: 0   Family History  Problem Relation Age of Onset  . CAD Father   . Heart attack Father      Social History   Tobacco Use  . Smoking status: Current Every Day Smoker    Packs/day: 2.00    Years: 40.00    Pack years: 80.00    Types: Cigarettes  . Smokeless tobacco: Never Used  . Tobacco comment: Only successfully quit for 1 month before due to acute illness. (01/24/17 - down to 1.5 PPD)  Substance Use Topics  . Alcohol use: No    Comment: Former alcohol abuse- sober since 2016  . Drug use: No    Allergies as of 05/13/2019 - Review Complete 05/13/2019  Allergen Reaction Noted  . Lorazepam Other (See Comments) 04/06/2016  . Iron Nausea Only 01/24/2017    Review of Systems:    All systems reviewed and negative except where noted in HPI.   Physical Exam:  BP (!) 188/91 (BP Location: Left Arm, Patient Position: Sitting, Cuff Size: Normal)   Pulse 72   Temp 98.6 F (37 C)   Resp 17   Ht 5\' 11"  (1.803 m)   Wt 128 lb 6.4 oz (58.2 kg)   BMI 17.91 kg/m  No LMP for male patient.  General:   Alert, thin built, pleasant and cooperative in NAD Head:  Normocephalic and atraumatic. Eyes:  Sclera clear, no icterus.   Conjunctiva pink. Ears:  Normal auditory acuity. Nose:  No deformity, discharge, or lesions. Mouth:  No deformity or lesions,oropharynx pink & moist. Neck:  Supple;  no masses or thyromegaly. Lungs:  Respirations even and unlabored.  Clear throughout to auscultation.   No wheezes, crackles, or rhonchi. No acute distress. Heart:  Regular rate and rhythm; no murmurs, clicks, rubs, or gallops. Abdomen:  Normal bowel sounds. Soft, non-tender and non-distended without masses, hepatosplenomegaly or hernias noted.  No guarding or rebound tenderness.   Rectal: Not performed Msk:  Symmetrical without gross deformities. Good, equal movement & strength bilaterally. Pulses:  Normal pulses noted. Extremities:  No clubbing or edema.  No cyanosis. Neurologic:  Alert and oriented x3;  grossly normal neurologically. Skin:  Intact without significant lesions or rashes. No jaundice. Psych:  Alert and cooperative. Normal mood and affect.  Imaging Studies: Reviewed  Assessment and Plan:   NIRVAN MUCCIO is a 58 y.o. Caucasian male with alcoholic cirrhosis, decompensated due to variceal bleed, status post variceal ligation x1 on 04/21/2018, x4 in  04/2019, chronic iron deficiency anemia, dependent on parenteral iron therapy  Cirrhosis of liver: Secondary to alcohol use, child class A, low MELD Abstinent from alcohol since 2016 Viral hepatitis panel negative for hepatitis A, B and C EGD revealed duodenal villous blunting, will check celiac serologies Recommend Twinrix vaccine, first dose received today Received Pneumovax in 04/2018 Recommend annual influenza vaccine Portal hypertension manifested by esophageal varices, thrombocytopenia Ultrasound Doppler revealed nonocclusive portal vein thrombus and stable or slightly improved, no indication for anticoagulation, followed by Dr. Mike Gip Currently euvolemic, recommend low-sodium diet Esophageal varices: History of bleeding, status post variceal ligation in 2019 and 2020 Continue secondary prophylaxis with nonselective beta-blockers, propranolol 20 mg twice daily. Recommend repeat EGD in 2 weeks [place] screening: AFP normal  and ultrasound with no liver lesions, repeat every 6 months  Chronic iron deficiency anemia: On chronic parenteral iron therapy Gastric ulcers detected on recent EGD Recommend repeat EGD Switch from Protonix to omeprazole 40 mg daily before breakfast Recommend video capsule endoscopy if anemia still persistent in 3 months  History of tubular adenomas of the colon: Last colonoscopy in 2018 by [name] Recommend repeat colonoscopy in 01/2020   Follow up every 3 months  Manuel Darby, MD

## 2019-05-15 ENCOUNTER — Telehealth: Payer: Self-pay | Admitting: Gastroenterology

## 2019-05-15 ENCOUNTER — Other Ambulatory Visit: Payer: Self-pay

## 2019-05-15 MED ORDER — CLARITHROMYCIN 500 MG PO TABS: 500.0000 mg | ORAL_TABLET | Freq: Two times a day (BID) | ORAL | 0 refills | Status: AC

## 2019-05-15 MED ORDER — OMEPRAZOLE 40 MG PO CPDR: 40.0000 mg | DELAYED_RELEASE_CAPSULE | Freq: Two times a day (BID) | ORAL | 0 refills | Status: DC

## 2019-05-15 MED ORDER — AMOXICILLIN 500 MG PO TABS: 1000.0000 mg | ORAL_TABLET | Freq: Two times a day (BID) | ORAL | 0 refills | Status: AC

## 2019-05-15 NOTE — Telephone Encounter (Signed)
Spoke with pt and notified him that insurance will not cover another prescription of Omeprazole 40 mg due to a refill was just sent to the pharmacy on 05/13/19, so he has been advised to take his current prescription of omeprazole x2 daily along with antibiotic therapy.

## 2019-05-15 NOTE — Telephone Encounter (Signed)
Pt left vm for [place] he states he was under the impression he was supposed to receive 3 prescriptions but [facility] only had a record of 2

## 2019-05-16 ENCOUNTER — Other Ambulatory Visit: Payer: Self-pay

## 2019-05-16 ENCOUNTER — Telehealth: Payer: Self-pay | Admitting: Gastroenterology

## 2019-05-16 NOTE — Telephone Encounter (Signed)
Pt is calling to change  His  Procedure date  to the week of  06/10/19

## 2019-05-16 NOTE — Telephone Encounter (Signed)
Pt has been rescheduled to 06/10/2019 per request, pt has been notified and verbalized understanding

## 2019-05-17 ENCOUNTER — Other Ambulatory Visit: Admission: RE | Admit: 2019-05-17 | Payer: 59 | Source: Ambulatory Visit

## 2019-05-23 LAB — H. PYLORI ANTIBODY, IGG: H. pylori, IgG AbS: 0.29 Index Value (ref 0.00–0.79)

## 2019-05-29 ENCOUNTER — Telehealth: Payer: Self-pay | Admitting: Gastroenterology

## 2019-05-29 ENCOUNTER — Other Ambulatory Visit: Payer: 59

## 2019-05-29 NOTE — Telephone Encounter (Signed)
Patient called & is out of town working and needs to r/s his covid & procedure. He will call to r/s .

## 2019-05-30 ENCOUNTER — Other Ambulatory Visit: Payer: 59

## 2019-05-31 ENCOUNTER — Ambulatory Visit: Payer: 59

## 2019-05-31 ENCOUNTER — Ambulatory Visit: Payer: 59 | Admitting: Hematology and Oncology

## 2019-06-06 ENCOUNTER — Encounter: Payer: Self-pay | Admitting: Gastroenterology

## 2019-06-06 ENCOUNTER — Other Ambulatory Visit: Admission: RE | Admit: 2019-06-06 | Payer: 59 | Source: Ambulatory Visit

## 2019-06-10 ENCOUNTER — Ambulatory Visit: Admission: RE | Admit: 2019-06-10 | Payer: 59 | Source: Ambulatory Visit | Admitting: Gastroenterology

## 2019-06-10 ENCOUNTER — Encounter: Admission: RE | Payer: Self-pay | Source: Ambulatory Visit

## 2019-06-10 SURGERY — ESOPHAGOGASTRODUODENOSCOPY (EGD) WITH PROPOFOL
Anesthesia: General

## 2019-07-02 ENCOUNTER — Other Ambulatory Visit: Payer: 59

## 2019-07-03 ENCOUNTER — Ambulatory Visit: Payer: 59

## 2019-07-03 ENCOUNTER — Ambulatory Visit: Payer: 59 | Admitting: Hematology and Oncology

## 2019-11-01 ENCOUNTER — Other Ambulatory Visit: Payer: Self-pay | Admitting: Gastroenterology

## 2019-11-01 DIAGNOSIS — K7030 Alcoholic cirrhosis of liver without ascites: Secondary | ICD-10-CM

## 2019-11-01 DIAGNOSIS — I8510 Secondary esophageal varices without bleeding: Secondary | ICD-10-CM

## 2020-05-10 ENCOUNTER — Other Ambulatory Visit: Payer: Self-pay | Admitting: Gastroenterology

## 2020-05-10 DIAGNOSIS — I8510 Secondary esophageal varices without bleeding: Secondary | ICD-10-CM

## 2020-05-10 DIAGNOSIS — K7030 Alcoholic cirrhosis of liver without ascites: Secondary | ICD-10-CM

## 2020-05-31 ENCOUNTER — Inpatient Hospital Stay
Admission: EM | Admit: 2020-05-31 | Discharge: 2020-07-01 | DRG: 432 | Disposition: E | Payer: 59 | Attending: Internal Medicine | Admitting: Internal Medicine

## 2020-05-31 ENCOUNTER — Emergency Department: Payer: 59

## 2020-05-31 ENCOUNTER — Other Ambulatory Visit: Payer: Self-pay

## 2020-05-31 DIAGNOSIS — K219 Gastro-esophageal reflux disease without esophagitis: Secondary | ICD-10-CM | POA: Diagnosis present

## 2020-05-31 DIAGNOSIS — J9601 Acute respiratory failure with hypoxia: Secondary | ICD-10-CM | POA: Diagnosis present

## 2020-05-31 DIAGNOSIS — R410 Disorientation, unspecified: Secondary | ICD-10-CM | POA: Diagnosis not present

## 2020-05-31 DIAGNOSIS — I10 Essential (primary) hypertension: Secondary | ICD-10-CM | POA: Diagnosis present

## 2020-05-31 DIAGNOSIS — Z8719 Personal history of other diseases of the digestive system: Secondary | ICD-10-CM

## 2020-05-31 DIAGNOSIS — E870 Hyperosmolality and hypernatremia: Secondary | ICD-10-CM | POA: Diagnosis not present

## 2020-05-31 DIAGNOSIS — K921 Melena: Secondary | ICD-10-CM | POA: Diagnosis present

## 2020-05-31 DIAGNOSIS — K7030 Alcoholic cirrhosis of liver without ascites: Secondary | ICD-10-CM | POA: Diagnosis not present

## 2020-05-31 DIAGNOSIS — R0902 Hypoxemia: Secondary | ICD-10-CM

## 2020-05-31 DIAGNOSIS — Z20822 Contact with and (suspected) exposure to covid-19: Secondary | ICD-10-CM | POA: Diagnosis present

## 2020-05-31 DIAGNOSIS — Z79899 Other long term (current) drug therapy: Secondary | ICD-10-CM

## 2020-05-31 DIAGNOSIS — Z8711 Personal history of peptic ulcer disease: Secondary | ICD-10-CM

## 2020-05-31 DIAGNOSIS — I8511 Secondary esophageal varices with bleeding: Secondary | ICD-10-CM | POA: Diagnosis present

## 2020-05-31 DIAGNOSIS — R0602 Shortness of breath: Secondary | ICD-10-CM

## 2020-05-31 DIAGNOSIS — K3189 Other diseases of stomach and duodenum: Secondary | ICD-10-CM | POA: Diagnosis present

## 2020-05-31 DIAGNOSIS — K259 Gastric ulcer, unspecified as acute or chronic, without hemorrhage or perforation: Secondary | ICD-10-CM | POA: Diagnosis present

## 2020-05-31 DIAGNOSIS — F1021 Alcohol dependence, in remission: Secondary | ICD-10-CM | POA: Diagnosis present

## 2020-05-31 DIAGNOSIS — F17210 Nicotine dependence, cigarettes, uncomplicated: Secondary | ICD-10-CM | POA: Diagnosis present

## 2020-05-31 DIAGNOSIS — Z66 Do not resuscitate: Secondary | ICD-10-CM | POA: Diagnosis not present

## 2020-05-31 DIAGNOSIS — Z515 Encounter for palliative care: Secondary | ICD-10-CM

## 2020-05-31 DIAGNOSIS — J441 Chronic obstructive pulmonary disease with (acute) exacerbation: Secondary | ICD-10-CM | POA: Diagnosis present

## 2020-05-31 DIAGNOSIS — I959 Hypotension, unspecified: Secondary | ICD-10-CM | POA: Diagnosis present

## 2020-05-31 DIAGNOSIS — D649 Anemia, unspecified: Secondary | ICD-10-CM | POA: Diagnosis present

## 2020-05-31 DIAGNOSIS — K7210 Chronic hepatic failure without coma: Secondary | ICD-10-CM | POA: Diagnosis present

## 2020-05-31 DIAGNOSIS — K766 Portal hypertension: Secondary | ICD-10-CM | POA: Diagnosis present

## 2020-05-31 DIAGNOSIS — G928 Other toxic encephalopathy: Secondary | ICD-10-CM | POA: Diagnosis not present

## 2020-05-31 DIAGNOSIS — D6959 Other secondary thrombocytopenia: Secondary | ICD-10-CM | POA: Diagnosis present

## 2020-05-31 DIAGNOSIS — Z781 Physical restraint status: Secondary | ICD-10-CM

## 2020-05-31 DIAGNOSIS — D62 Acute posthemorrhagic anemia: Secondary | ICD-10-CM | POA: Diagnosis not present

## 2020-05-31 DIAGNOSIS — Z23 Encounter for immunization: Secondary | ICD-10-CM

## 2020-05-31 LAB — CBC WITH DIFFERENTIAL/PLATELET
Abs Immature Granulocytes: 0.05 10*3/uL (ref 0.00–0.07)
Basophils Absolute: 0.1 10*3/uL (ref 0.0–0.1)
Basophils Relative: 1 %
Eosinophils Absolute: 0.1 10*3/uL (ref 0.0–0.5)
Eosinophils Relative: 2 %
HCT: 24.4 % — ABNORMAL LOW (ref 39.0–52.0)
Hemoglobin: 6.4 g/dL — ABNORMAL LOW (ref 13.0–17.0)
Immature Granulocytes: 1 %
Lymphocytes Relative: 11 %
Lymphs Abs: 1 10*3/uL (ref 0.7–4.0)
MCH: 18.9 pg — ABNORMAL LOW (ref 26.0–34.0)
MCHC: 26.2 g/dL — ABNORMAL LOW (ref 30.0–36.0)
MCV: 72 fL — ABNORMAL LOW (ref 80.0–100.0)
Monocytes Absolute: 1 10*3/uL (ref 0.1–1.0)
Monocytes Relative: 12 %
Neutro Abs: 6.5 10*3/uL (ref 1.7–7.7)
Neutrophils Relative %: 73 %
Platelets: 102 10*3/uL — ABNORMAL LOW (ref 150–400)
RBC: 3.39 MIL/uL — ABNORMAL LOW (ref 4.22–5.81)
RDW: 25.3 % — ABNORMAL HIGH (ref 11.5–15.5)
WBC: 8.8 10*3/uL (ref 4.0–10.5)
nRBC: 0.3 % — ABNORMAL HIGH (ref 0.0–0.2)

## 2020-05-31 LAB — COMPREHENSIVE METABOLIC PANEL
ALT: 12 U/L (ref 0–44)
AST: 17 U/L (ref 15–41)
Albumin: 3.3 g/dL — ABNORMAL LOW (ref 3.5–5.0)
Alkaline Phosphatase: 67 U/L (ref 38–126)
Anion gap: 11 (ref 5–15)
BUN: 19 mg/dL (ref 6–20)
CO2: 25 mmol/L (ref 22–32)
Calcium: 8.7 mg/dL — ABNORMAL LOW (ref 8.9–10.3)
Chloride: 106 mmol/L (ref 98–111)
Creatinine, Ser: 0.56 mg/dL — ABNORMAL LOW (ref 0.61–1.24)
GFR, Estimated: >60 mL/min (ref >60)
Glucose, Bld: 132 mg/dL — ABNORMAL HIGH (ref 70–99)
Potassium: 4.8 mmol/L (ref 3.5–5.1)
Sodium: 142 mmol/L (ref 135–145)
Total Bilirubin: 0.4 mg/dL (ref 0.3–1.2)
Total Protein: 5.8 g/dL — ABNORMAL LOW (ref 6.5–8.1)

## 2020-05-31 MED ORDER — IPRATROPIUM-ALBUTEROL 0.5-2.5 (3) MG/3ML IN SOLN
3.0000 mL | Freq: Once | RESPIRATORY_TRACT | Status: AC
  Administered 2020-05-31: 3 mL via RESPIRATORY_TRACT
  Filled 2020-05-31: qty 3

## 2020-05-31 MED ORDER — SODIUM CHLORIDE 0.9 % IV SOLN
10.0000 mL/h | Freq: Once | INTRAVENOUS | Status: AC
  Administered 2020-06-01: 10 mL/h via INTRAVENOUS

## 2020-05-31 NOTE — ED Triage Notes (Signed)
Pt states he feels like he has low oxygen- pt is wheezy- pt states he has a hx of anemia- pt states it feels like he cannot catch his breath unless he rests- pt states he had a fever this afternoon

## 2020-05-31 NOTE — ED Notes (Signed)
Blood consent form given to pt. Witnessed signature by this Therapist, sports.

## 2020-05-31 NOTE — ED Provider Notes (Signed)
[facility] Emergency Department Provider Note   ____________________________________________   First MD Initiated Contact with Patient 05/21/2020 2258     (approximate)  I have reviewed the triage vital signs and the nursing notes.   HISTORY  Chief Complaint Shortness of Breath    HPI Manuel Jimenez is a 59 y.o. male with a stated past medical history of alcoholic cirrhosis, hypertension, recurrent anemia, and tobacco abuse who presents for shortness of breath that is worsening over the last 24 hours and is associated with conjunctival pallor according to patient.  Patient states that he feels as though he cannot catch his breath unless he is sitting down.  The symptoms worsen upon exertion.  Patient states that he has had similar symptoms in the past when he has been anemic.         Past Medical History:  Diagnosis Date  . Anemia   . Cancer ([place])   . Cirrhosis ([place])   . Colon polyps   . Constipation   . Diarrhea   . Hemorrhoids   . Hypertension    in past  . Portal vein thrombosis    see chart review 06/05/15  . Shingles 09/2016  . Stomach ulcer   . Weight loss     Patient Active Problem List   Diagnosis Date Noted  . History of esophageal varices with bleeding 06/13/2020  . History of gastric ulcer 06/20/2020  . Hypoxia 06/21/2020  . Acute respiratory failure with hypoxia ([place]) 06/25/2020  . Esophageal varices in alcoholic cirrhosis ([place]) 78/29/5621  . Peptic ulcer disease 05/13/2019  . Secondary esophageal varices with bleeding ([place])   . Portal hypertension ([place])   . Acute blood loss anemia   . Melena   . Symptomatic anemia 04/04/2019  . Thrombocytopenia ([place]) 11/22/2016  . Herpes zoster without complication 30/86/5784  . Low serum HDL 06/18/2016  . Hypertension 04/18/2016  . GERD (gastroesophageal reflux disease) 04/18/2016  . Tobacco abuse 04/18/2016  . Heart murmur previously undiagnosed 04/18/2016  . Alcoholic cirrhosis  of liver without ascites ([place]) 04/06/2016  . Diverticulitis of large intestine without perforation or abscess without bleeding 04/06/2016  . PVT (portal vein thrombosis) 04/06/2016  . Moderate protein-calorie malnutrition ([place]) 05/08/2015  . Iron deficiency anemia due to chronic blood loss 05/06/2015    Past Surgical History:  Procedure Laterality Date  . COLONOSCOPY N/A 06/17/2015   Procedure: COLONOSCOPY;  Surgeon: Hulen Luster, MD;  Location: [place];  Service: Gastroenterology;  Laterality: N/A;  . COLONOSCOPY WITH PROPOFOL N/A 02/02/2017   Procedure: COLONOSCOPY WITH PROPOFOL;  Surgeon: [name], MD;  Location: [place];  Service: Endoscopy;  Laterality: N/A;  . ENTEROSCOPY N/A 04/21/2018   Procedure: ENTEROSCOPY;  Surgeon: Lin Landsman, MD;  [facility] ENDOSCOPY;  Service: Gastroenterology;  Laterality: N/A;  . ESOPHAGOGASTRODUODENOSCOPY N/A 05/14/2015   Procedure: ESOPHAGOGASTRODUODENOSCOPY (EGD);  Surgeon: Hulen Luster, MD;  Location: [facility] ENDOSCOPY;  Service: Endoscopy;  Laterality: N/A;  . ESOPHAGOGASTRODUODENOSCOPY N/A 04/21/2018   Procedure: ESOPHAGOGASTRODUODENOSCOPY (EGD);  Surgeon: Lin Landsman, MD;  Location: [facility] ENDOSCOPY;  Service: Gastroenterology;  Laterality: N/A;  . ESOPHAGOGASTRODUODENOSCOPY N/A 04/05/2019   Procedure: ESOPHAGOGASTRODUODENOSCOPY (EGD);  Surgeon: Virgel Manifold, MD;  Location: [facility] ENDOSCOPY;  Service: Endoscopy;  Laterality: N/A;  . ESOPHAGOGASTRODUODENOSCOPY (EGD) WITH PROPOFOL N/A 02/02/2017   Procedure: ESOPHAGOGASTRODUODENOSCOPY (EGD) WITH PROPOFOL;  Surgeon: [name], MD;  Location: [place];  Service: Endoscopy;  Laterality: N/A;  . FLEXIBLE SIGMOIDOSCOPY N/A 04/05/2019  Procedure: FLEXIBLE SIGMOIDOSCOPY;  Surgeon: Virgel Manifold, MD;  Location: [facility] ENDOSCOPY;  Service: Endoscopy;  Laterality: N/A;  . POLYPECTOMY  02/02/2017   Procedure: POLYPECTOMY;  Surgeon: [name], MD;  Location:  [place];  Service: Endoscopy;;    Prior to Admission medications   Medication Sig Start Date End Date Taking? Authorizing Provider  FERREX 150 150 MG capsule TAKE 1 CAPSULE BY MOUTH EVERY DAY 04/23/18  Yes Verlon Au, NP  omeprazole (PRILOSEC) 40 MG capsule Take 1 capsule (40 mg total) by mouth daily before breakfast. 05/13/19 06/15/2020 Yes Vanga, Tally Due, MD  pantoprazole (PROTONIX) 40 MG tablet Take 1 tablet (40 mg total) by mouth 2 (two) times daily before a meal. 04/07/19 06/14/2020 Yes [name], MD  propranolol (INDERAL) 20 MG tablet TAKE 1 TABLET BY MOUTH TWICE A DAY 11/01/19  Yes Vanga, Tally Due, MD  propranolol (INDERAL) 20 MG tablet Take 1 tablet (20 mg total) by mouth 2 (two) times daily. 05/13/19   Lin Landsman, MD    Allergies Lorazepam and Iron  Family History  Problem Relation Age of Onset  . CAD Father   . Heart attack Father     Social History Social History   Tobacco Use  . Smoking status: Current Every Day Smoker    Packs/day: 2.00    Years: 40.00    Pack years: 80.00    Types: Cigarettes  . Smokeless tobacco: Never Used  . Tobacco comment: Only successfully quit for 1 month before due to acute illness. (01/24/17 - down to 1.5 PPD)  Substance Use Topics  . Alcohol use: No    Comment: Former alcohol abuse- sober since 2016  . Drug use: No    Review of Systems Constitutional: No fever/chills Eyes: No visual changes. ENT: No sore throat. Cardiovascular: Denies chest pain. Respiratory: Endorses shortness of breath. Gastrointestinal: No abdominal pain.  No nausea, no vomiting.  No diarrhea. Genitourinary: Negative for dysuria. Musculoskeletal: Negative for acute arthralgias Skin: Negative for rash. Neurological: Negative for headaches, weakness/numbness/paresthesias in any extremity Psychiatric: Negative for suicidal ideation/homicidal ideation   ____________________________________________   PHYSICAL  EXAM:  VITAL SIGNS: ED Triage Vitals  Enc Vitals Group     BP 05/06/2020 2102 (!) 91/58     Pulse Rate 05/08/2020 2102 62     Resp 05/17/2020 2102 (!) 26     Temp 05/03/2020 2102 98.2 F (36.8 C)     Temp Source 05/06/2020 2102 Oral     SpO2 05/12/2020 2101 (!) 78 %     Weight 05/29/2020 2103 134 lb (60.8 kg)     Height 05/02/2020 2103 5\' 11"  (1.803 m)     Head Circumference --      Peak Flow --      Pain Score 05/21/2020 2103 0     Pain Loc --      Pain Edu? --      Excl. in [place]? --    Constitutional: Alert and oriented. Well appearing and in no acute distress. Eyes: Conjunctivae are normal. PERRL. Head: Atraumatic. Nose: No congestion/rhinnorhea. Mouth/Throat: Mucous membranes are moist. Neck: No stridor Cardiovascular: Grossly normal heart sounds.  Good peripheral circulation. Respiratory: Normal respiratory effort.  No retractions.  Expiratory wheezes over bilateral lung fields Gastrointestinal: Soft and nontender. No distention. Musculoskeletal: No obvious deformities Neurologic:  Normal speech and language. No gross focal neurologic deficits are appreciated. Skin:  Skin is warm and dry. No rash noted. Psychiatric: Mood and affect are normal.  Speech and behavior are normal.  ____________________________________________   LABS (all labs ordered are listed, but only abnormal results are displayed)  Labs Reviewed  CBC WITH DIFFERENTIAL/PLATELET - Abnormal; Notable for the following components:      Result Value   RBC 3.39 (*)    Hemoglobin 6.4 (*)    HCT 24.4 (*)    MCV 72.0 (*)    MCH 18.9 (*)    MCHC 26.2 (*)    RDW 25.3 (*)    Platelets 102 (*)    nRBC 0.3 (*)    All other components within normal limits  COMPREHENSIVE METABOLIC PANEL - Abnormal; Notable for the following components:   Glucose, Bld 132 (*)    Creatinine, Ser 0.56 (*)    Calcium 8.7 (*)    Total Protein 5.8 (*)    Albumin 3.3 (*)    All other components within normal limits  CBC - Abnormal; Notable for  the following components:   RBC 3.81 (*)    Hemoglobin 8.0 (*)    HCT 28.9 (*)    MCV 75.9 (*)    MCH 21.0 (*)    MCHC 27.7 (*)    RDW 24.5 (*)    Platelets 68 (*)    nRBC 0.8 (*)    All other components within normal limits  RESPIRATORY PANEL BY RT PCR (FLU A&B, COVID)  AMMONIA  HIV ANTIBODY (ROUTINE TESTING W REFLEX)  PREPARE RBC (CROSSMATCH)  TYPE AND SCREEN  PREPARE RBC (CROSSMATCH)   ____________________________________________  EKG  ED ECG REPORT I, Naaman Plummer, the attending physician, personally viewed and interpreted this ECG.  Date: 05/15/2020 EKG Time: 2107 Rate: 63 Rhythm: normal sinus rhythm QRS Axis: normal Intervals: normal ST/T Wave abnormalities: normal Narrative Interpretation: no evidence of acute ischemia  ____________________________________________  RADIOLOGY  ED MD interpretation: 2 view x-ray of the chest shows small right pleural effusion with right infrahilar atelectasis.  No signs of pneumothorax, pneumonia, or widened mediastinum  Official radiology report(s): DG Chest 2 View  Result Date: 05/02/2020 CLINICAL DATA:  Wheezing and shortness of breath. EXAM: CHEST - 2 VIEW COMPARISON:  May 12, 2015 FINDINGS: Mild, diffuse, chronic appearing increased interstitial lung markings are seen. Mild atelectasis and/or early infiltrate is seen along the infrahilar region on the right. There is a small right pleural effusion. No pneumothorax is identified. The heart size and mediastinal contours are within normal limits. The visualized skeletal structures are unremarkable. IMPRESSION: 1. Mild right infrahilar atelectasis and/or early infiltrate. 2. Small right pleural effusion. Electronically Signed   By: Virgina Norfolk M.D.   On: 05/03/2020 22:06    ____________________________________________   PROCEDURES  Procedure(s) performed (including Critical Care):  .Critical Care Performed by: Naaman Plummer, MD Authorized by: Naaman Plummer, MD   Critical care provider statement:    Critical care time (minutes):  49   Critical care time was exclusive of:  Separately billable procedures and treating other patients   Critical care was necessary to treat or prevent imminent or life-threatening deterioration of the following conditions:  Circulatory failure   Critical care was time spent personally by me on the following activities:  Discussions with consultants, evaluation of patient's response to treatment, examination of patient, ordering and performing treatments and interventions, ordering and review of laboratory studies, ordering and review of radiographic studies, pulse oximetry, re-evaluation of patient's condition, obtaining history from patient or surrogate and review of old charts   I assumed direction of critical  care for this patient from another provider in my specialty: no   .1-3 Lead EKG Interpretation Performed by: Naaman Plummer, MD Authorized by: Naaman Plummer, MD     Interpretation: normal     ECG rate:  102   ECG rate assessment: tachycardic     Rhythm: sinus tachycardia     Ectopy: none     Conduction: normal       ____________________________________________   INITIAL IMPRESSION / ASSESSMENT AND PLAN / ED COURSE  As part of my medical decision making, I reviewed the following data within the [place] notes reviewed and incorporated, Labs reviewed, EKG interpreted, Old chart reviewed, Radiograph reviewed and Notes from prior ED visits reviewed and incorporated     Patient is a 59 year old male with a stated past medical history of cirrhosis, esophageal varices, COPD, and recurrent GI bleeding who presents for worsening shortness of breath, palpitations, dyspnea on exertion.  Differential diagnosis includes COPD exacerbation, CHF, ACS, symptomatic anemia, and GI bleeding.  Laboratory evaluation significant for hemoglobin of 6.4.  Patient also found to have an expiratory  wheezing on exam concerning for COPD exacerbation.  Patient will be treated empirically with 1 unit packed red cell transfusion as well as steroids, antibiotics, and DuoNeb.  Patient notes wheezing resolved after treatment however patient remained tachycardic and requiring supplemental oxygen.  Given these persistent symptoms and oxygen requirement, patient will require admission to the internal medicine service for further evaluation and management.       ____________________________________________   FINAL CLINICAL IMPRESSION(S) / ED DIAGNOSES  Final diagnoses:  Symptomatic anemia  Acute respiratory failure with hypoxia ([place])  COPD exacerbation [facility])     ED Discharge Orders    None       Note:  This document was prepared using Dragon voice recognition software and may include unintentional dictation errors.   Naaman Plummer, MD 06/13/2020 [phone]

## 2020-06-01 ENCOUNTER — Encounter: Payer: Self-pay | Admitting: Internal Medicine

## 2020-06-01 ENCOUNTER — Inpatient Hospital Stay: Payer: 59 | Admitting: Anesthesiology

## 2020-06-01 ENCOUNTER — Encounter: Admission: EM | Disposition: E | Payer: Self-pay | Source: Home / Self Care | Attending: Internal Medicine

## 2020-06-01 DIAGNOSIS — D696 Thrombocytopenia, unspecified: Secondary | ICD-10-CM | POA: Diagnosis not present

## 2020-06-01 DIAGNOSIS — Z8719 Personal history of other diseases of the digestive system: Secondary | ICD-10-CM

## 2020-06-01 DIAGNOSIS — Z66 Do not resuscitate: Secondary | ICD-10-CM | POA: Diagnosis not present

## 2020-06-01 DIAGNOSIS — D6959 Other secondary thrombocytopenia: Secondary | ICD-10-CM | POA: Diagnosis present

## 2020-06-01 DIAGNOSIS — G928 Other toxic encephalopathy: Secondary | ICD-10-CM | POA: Diagnosis not present

## 2020-06-01 DIAGNOSIS — Z79899 Other long term (current) drug therapy: Secondary | ICD-10-CM | POA: Diagnosis not present

## 2020-06-01 DIAGNOSIS — Z20822 Contact with and (suspected) exposure to covid-19: Secondary | ICD-10-CM | POA: Diagnosis present

## 2020-06-01 DIAGNOSIS — K219 Gastro-esophageal reflux disease without esophagitis: Secondary | ICD-10-CM | POA: Diagnosis present

## 2020-06-01 DIAGNOSIS — K7210 Chronic hepatic failure without coma: Secondary | ICD-10-CM | POA: Diagnosis present

## 2020-06-01 DIAGNOSIS — D62 Acute posthemorrhagic anemia: Secondary | ICD-10-CM | POA: Diagnosis present

## 2020-06-01 DIAGNOSIS — I10 Essential (primary) hypertension: Secondary | ICD-10-CM | POA: Diagnosis present

## 2020-06-01 DIAGNOSIS — I959 Hypotension, unspecified: Secondary | ICD-10-CM | POA: Diagnosis present

## 2020-06-01 DIAGNOSIS — I8511 Secondary esophageal varices with bleeding: Secondary | ICD-10-CM | POA: Diagnosis present

## 2020-06-01 DIAGNOSIS — J9601 Acute respiratory failure with hypoxia: Secondary | ICD-10-CM | POA: Diagnosis present

## 2020-06-01 DIAGNOSIS — K259 Gastric ulcer, unspecified as acute or chronic, without hemorrhage or perforation: Secondary | ICD-10-CM | POA: Diagnosis present

## 2020-06-01 DIAGNOSIS — J441 Chronic obstructive pulmonary disease with (acute) exacerbation: Secondary | ICD-10-CM | POA: Diagnosis present

## 2020-06-01 DIAGNOSIS — K7030 Alcoholic cirrhosis of liver without ascites: Secondary | ICD-10-CM | POA: Diagnosis present

## 2020-06-01 DIAGNOSIS — Z8711 Personal history of peptic ulcer disease: Secondary | ICD-10-CM

## 2020-06-01 DIAGNOSIS — F1021 Alcohol dependence, in remission: Secondary | ICD-10-CM | POA: Diagnosis present

## 2020-06-01 DIAGNOSIS — R0902 Hypoxemia: Secondary | ICD-10-CM | POA: Diagnosis not present

## 2020-06-01 DIAGNOSIS — Z781 Physical restraint status: Secondary | ICD-10-CM | POA: Diagnosis not present

## 2020-06-01 DIAGNOSIS — D649 Anemia, unspecified: Secondary | ICD-10-CM | POA: Diagnosis not present

## 2020-06-01 DIAGNOSIS — Z515 Encounter for palliative care: Secondary | ICD-10-CM | POA: Diagnosis not present

## 2020-06-01 DIAGNOSIS — Z23 Encounter for immunization: Secondary | ICD-10-CM | POA: Diagnosis not present

## 2020-06-01 DIAGNOSIS — R410 Disorientation, unspecified: Secondary | ICD-10-CM | POA: Diagnosis not present

## 2020-06-01 DIAGNOSIS — K766 Portal hypertension: Secondary | ICD-10-CM | POA: Diagnosis present

## 2020-06-01 DIAGNOSIS — F17210 Nicotine dependence, cigarettes, uncomplicated: Secondary | ICD-10-CM | POA: Diagnosis present

## 2020-06-01 DIAGNOSIS — K3189 Other diseases of stomach and duodenum: Secondary | ICD-10-CM | POA: Diagnosis present

## 2020-06-01 DIAGNOSIS — Z7189 Other specified counseling: Secondary | ICD-10-CM | POA: Diagnosis not present

## 2020-06-01 DIAGNOSIS — E870 Hyperosmolality and hypernatremia: Secondary | ICD-10-CM | POA: Diagnosis not present

## 2020-06-01 HISTORY — PX: ESOPHAGOGASTRODUODENOSCOPY: SHX5428

## 2020-06-01 LAB — CBC
HCT: 28.9 % — ABNORMAL LOW (ref 39.0–52.0)
Hemoglobin: 8 g/dL — ABNORMAL LOW (ref 13.0–17.0)
MCH: 21 pg — ABNORMAL LOW (ref 26.0–34.0)
MCHC: 27.7 g/dL — ABNORMAL LOW (ref 30.0–36.0)
MCV: 75.9 fL — ABNORMAL LOW (ref 80.0–100.0)
Platelets: 68 K/uL — ABNORMAL LOW (ref 150–400)
RBC: 3.81 MIL/uL — ABNORMAL LOW (ref 4.22–5.81)
RDW: 24.5 % — ABNORMAL HIGH (ref 11.5–15.5)
WBC: 8.7 K/uL (ref 4.0–10.5)
nRBC: 0.8 % — ABNORMAL HIGH (ref 0.0–0.2)

## 2020-06-01 LAB — HIV ANTIBODY (ROUTINE TESTING W REFLEX): HIV Screen 4th Generation wRfx: NONREACTIVE

## 2020-06-01 LAB — RESPIRATORY PANEL BY RT PCR (FLU A&B, COVID)
Influenza A by PCR: NEGATIVE
Influenza B by PCR: NEGATIVE
SARS Coronavirus 2 by RT PCR: NEGATIVE

## 2020-06-01 LAB — PREPARE RBC (CROSSMATCH)

## 2020-06-01 LAB — AMMONIA: Ammonia: 28 umol/L (ref 9–35)

## 2020-06-01 SURGERY — EGD (ESOPHAGOGASTRODUODENOSCOPY)
Anesthesia: General

## 2020-06-01 MED ORDER — GLYCOPYRROLATE 0.2 MG/ML IJ SOLN
INTRAMUSCULAR | Status: DC | PRN
  Administered 2020-06-01 (×2): .2 mg via INTRAVENOUS

## 2020-06-01 MED ORDER — OCTREOTIDE LOAD VIA INFUSION
50.0000 ug | Freq: Once | INTRAVENOUS | Status: AC
  Administered 2020-06-01: 50 ug via INTRAVENOUS
  Filled 2020-06-01: qty 25

## 2020-06-01 MED ORDER — SODIUM CHLORIDE 0.9 % IV SOLN
50.0000 ug/h | INTRAVENOUS | Status: DC
  Administered 2020-06-01 – 2020-06-05 (×10): 50 ug/h via INTRAVENOUS
  Filled 2020-06-01 (×18): qty 1

## 2020-06-01 MED ORDER — IPRATROPIUM-ALBUTEROL 0.5-2.5 (3) MG/3ML IN SOLN
3.0000 mL | Freq: Four times a day (QID) | RESPIRATORY_TRACT | Status: DC
  Administered 2020-06-01: 3 mL via RESPIRATORY_TRACT
  Filled 2020-06-01: qty 6
  Filled 2020-06-01 (×2): qty 3
  Filled 2020-06-01: qty 15
  Filled 2020-06-01 (×2): qty 3

## 2020-06-01 MED ORDER — LORAZEPAM 2 MG/ML IJ SOLN
0.5000 mg | INTRAMUSCULAR | Status: DC | PRN
  Administered 2020-06-01: 0.5 mg via INTRAVENOUS
  Filled 2020-06-01: qty 1

## 2020-06-01 MED ORDER — ONDANSETRON HCL 4 MG/2ML IJ SOLN
4.0000 mg | Freq: Four times a day (QID) | INTRAMUSCULAR | Status: DC | PRN
  Administered 2020-06-02: 4 mg via INTRAVENOUS
  Filled 2020-06-01: qty 2

## 2020-06-01 MED ORDER — ONDANSETRON HCL 4 MG PO TABS: 4.0000 mg | ORAL_TABLET | Freq: Four times a day (QID) | ORAL | Status: DC | PRN

## 2020-06-01 MED ORDER — LIDOCAINE HCL (CARDIAC) PF 100 MG/5ML IV SOSY
PREFILLED_SYRINGE | INTRAVENOUS | Status: DC | PRN
  Administered 2020-06-01: 100 mg via INTRAVENOUS

## 2020-06-01 MED ORDER — MOMETASONE FURO-FORMOTEROL FUM 100-5 MCG/ACT IN AERO
2.0000 | INHALATION_SPRAY | Freq: Two times a day (BID) | RESPIRATORY_TRACT | Status: DC
  Filled 2020-06-01: qty 8.8

## 2020-06-01 MED ORDER — MORPHINE SULFATE (PF) 2 MG/ML IV SOLN
2.0000 mg | INTRAVENOUS | Status: DC | PRN
  Administered 2020-06-02 – 2020-06-05 (×15): 2 mg via INTRAVENOUS
  Filled 2020-06-01 (×16): qty 1

## 2020-06-01 MED ORDER — HALOPERIDOL LACTATE 5 MG/ML IJ SOLN
1.0000 mg | Freq: Four times a day (QID) | INTRAMUSCULAR | Status: DC | PRN
  Administered 2020-06-01 – 2020-06-05 (×9): 1 mg via INTRAVENOUS
  Filled 2020-06-01 (×9): qty 1

## 2020-06-01 MED ORDER — PROPOFOL 500 MG/50ML IV EMUL
INTRAVENOUS | Status: DC | PRN
  Administered 2020-06-01: 50 ug/kg/min via INTRAVENOUS

## 2020-06-01 MED ORDER — SODIUM CHLORIDE 0.9 % IV SOLN
1.0000 g | Freq: Once | INTRAVENOUS | Status: AC
  Administered 2020-06-01: 1 g via INTRAVENOUS
  Filled 2020-06-01: qty 10

## 2020-06-01 MED ORDER — PANTOPRAZOLE SODIUM 40 MG IV SOLR
40.0000 mg | Freq: Once | INTRAVENOUS | Status: AC
  Administered 2020-06-01: 40 mg via INTRAVENOUS
  Filled 2020-06-01: qty 40

## 2020-06-01 MED ORDER — PNEUMOCOCCAL VAC POLYVALENT 25 MCG/0.5ML IJ INJ: 0.5000 mL | INJECTION | INTRAMUSCULAR | Status: DC

## 2020-06-01 MED ORDER — QUETIAPINE FUMARATE 25 MG PO TABS
25.0000 mg | ORAL_TABLET | Freq: Every evening | ORAL | Status: DC | PRN
  Administered 2020-06-01: 25 mg via ORAL
  Filled 2020-06-01: qty 1

## 2020-06-01 MED ORDER — FLUTICASONE FUROATE-VILANTEROL 100-25 MCG/INH IN AEPB
1.0000 | INHALATION_SPRAY | Freq: Every day | RESPIRATORY_TRACT | Status: DC
  Filled 2020-06-01: qty 28

## 2020-06-01 MED ORDER — SODIUM CHLORIDE 0.9 % IV SOLN
10.0000 mL/h | Freq: Once | INTRAVENOUS | Status: AC
  Administered 2020-06-01: 10 mL/h via INTRAVENOUS

## 2020-06-01 MED ORDER — PROPOFOL 10 MG/ML IV BOLUS
INTRAVENOUS | Status: DC | PRN
  Administered 2020-06-01: 20 mg via INTRAVENOUS

## 2020-06-01 MED ORDER — PANTOPRAZOLE SODIUM 40 MG IV SOLR
40.0000 mg | INTRAVENOUS | Status: DC
  Administered 2020-06-01 – 2020-06-02 (×2): 40 mg via INTRAVENOUS
  Filled 2020-06-01 (×2): qty 40

## 2020-06-01 MED ORDER — INFLUENZA VAC SPLIT QUAD 0.5 ML IM SUSY
0.5000 mL | PREFILLED_SYRINGE | INTRAMUSCULAR | Status: DC
  Filled 2020-06-01: qty 0.5

## 2020-06-01 MED ORDER — SODIUM CHLORIDE 0.9 % IV SOLN: INTRAVENOUS | Status: DC

## 2020-06-01 MED ORDER — METHYLPREDNISOLONE SODIUM SUCC 40 MG IJ SOLR
40.0000 mg | Freq: Every day | INTRAMUSCULAR | Status: DC
  Administered 2020-06-01: 40 mg via INTRAVENOUS
  Filled 2020-06-01: qty 1

## 2020-06-01 MED ORDER — SODIUM CHLORIDE 0.9 % IV SOLN
1.0000 g | INTRAVENOUS | Status: DC
  Administered 2020-06-02 – 2020-06-05 (×4): 1 g via INTRAVENOUS
  Filled 2020-06-01 (×3): qty 1
  Filled 2020-06-01: qty 10

## 2020-06-01 MED ORDER — FUROSEMIDE 10 MG/ML IJ SOLN
60.0000 mg | Freq: Once | INTRAMUSCULAR | Status: AC
  Administered 2020-06-01: 60 mg via INTRAVENOUS
  Filled 2020-06-01: qty 8

## 2020-06-01 NOTE — H&P (Signed)
History and Physical    Manuel Jimenez:[id number] DOB: [birth year] DOA: 05/25/2020  PCP: [name], DO   Patient coming from: Home  I have personally briefly reviewed patient's old medical records in [place]  Chief Complaint: Dyspnea on exertion, weakness  HPI: Manuel Jimenez is a 59 y.o. male with medical history significant for Alcoholic cirrhosis with portal hypertension with history of bleeding esophageal varices as well as history of gastric ulcer who presents to the emergency room with a several day history of shortness of breath on exertion that has been becoming progressively worse, similar to prior episodes of symptomatic anemia.  He admits to dark tarry stools in the past couple days.  He also had a subjective fever but denies cough.  Denies nausea vomiting abdominal pain. ED course: On arrival, he was afebrile with blood pressure 91/58, pulse 62 tachypneic at 26 with O2 sat 78% on room air requiring 2 to 4 L to keep sats in the low 90s.  On his blood work hemoglobin 6.4, down from 10.8 a year prior, platelets 102, CMP unremarkable.  Chest x-ray showed mild right infrahilar atelectasis and was early infiltrate with small right pleural effusion EKG as interpreted by me: Showed normal sinus rhythm with rate of 62 with no acute ST-T wave changes. Patient started on IV Protonix and IV octreotide as well as unit packed red blood cells. The emergency room provider spoke with GI who will scope patient first thing in the morning.  Hospitalist consulted for admission.    Review of Systems: As per HPI otherwise all other systems on review of systems negative.    Past Medical History:  Diagnosis Date  . Anemia   . Cancer ([place])   . Cirrhosis ([place])   . Colon polyps   . Constipation   . Diarrhea   . Hemorrhoids   . Hypertension    in past  . Portal vein thrombosis    see chart review 06/05/15  . Shingles 09/2016  . Stomach ulcer   . Weight loss      Past Surgical History:  Procedure Laterality Date  . COLONOSCOPY N/A 06/17/2015   Procedure: COLONOSCOPY;  Surgeon: Hulen Luster, MD;  Location: [place];  Service: Gastroenterology;  Laterality: N/A;  . COLONOSCOPY WITH PROPOFOL N/A 02/02/2017   Procedure: COLONOSCOPY WITH PROPOFOL;  Surgeon: [name], MD;  Location: [place];  Service: Endoscopy;  Laterality: N/A;  . ENTEROSCOPY N/A 04/21/2018   Procedure: ENTEROSCOPY;  Surgeon: Lin Landsman, MD;  Location[facility] ENDOSCOPY;  Service: Gastroenterology;  Laterality: N/A;  . ESOPHAGOGASTRODUODENOSCOPY N/A 05/14/2015   Procedure: ESOPHAGOGASTRODUODENOSCOPY (EGD);  Surgeon: Hulen Luster, MD;  Location: [facility] ENDOSCOPY;  Service: Endoscopy;  Laterality: N/A;  . ESOPHAGOGASTRODUODENOSCOPY N/A 04/21/2018   Procedure: ESOPHAGOGASTRODUODENOSCOPY (EGD);  Surgeon: Lin Landsman, MD;  Location: [facility] ENDOSCOPY;  Service: Gastroenterology;  Laterality: N/A;  . ESOPHAGOGASTRODUODENOSCOPY N/A 04/05/2019   Procedure: ESOPHAGOGASTRODUODENOSCOPY (EGD);  Surgeon: Virgel Manifold, MD;  Location: [facility] ENDOSCOPY;  Service: Endoscopy;  Laterality: N/A;  . ESOPHAGOGASTRODUODENOSCOPY (EGD) WITH PROPOFOL N/A 02/02/2017   Procedure: ESOPHAGOGASTRODUODENOSCOPY (EGD) WITH PROPOFOL;  Surgeon: [name], MD;  Location: [place];  Service: Endoscopy;  Laterality: N/A;  . FLEXIBLE SIGMOIDOSCOPY N/A 04/05/2019   Procedure: FLEXIBLE SIGMOIDOSCOPY;  Surgeon: Virgel Manifold, MD;  Location: [facility] ENDOSCOPY;  Service: Endoscopy;  Laterality: N/A;  . POLYPECTOMY  02/02/2017   Procedure: POLYPECTOMY;  Surgeon: [name], MD;  Location: [place]  [facility];  Service: Endoscopy;;     reports that he has been smoking cigarettes. He has a 80.00 pack-year smoking history. He has never used smokeless tobacco. He reports that he does not drink alcohol and does not use drugs.  Allergies  Allergen Reactions  . Lorazepam Other (See  Comments)    "made me anxious and mean"   . Iron Nausea Only    Family History  Problem Relation Age of Onset  . CAD Father   . Heart attack Father       Prior to Admission medications   Medication Sig Start Date End Date Taking? Authorizing Provider  FERREX 150 150 MG capsule TAKE 1 CAPSULE BY MOUTH EVERY DAY 04/23/18   Verlon Au, NP  loratadine (CLARITIN) 10 MG tablet Take 10 mg by mouth daily.    [provider]  omeprazole (PRILOSEC) 40 MG capsule Take 1 capsule (40 mg total) by mouth daily before breakfast. 05/13/19 08/11/19  Lin Landsman, MD  omeprazole (PRILOSEC) 40 MG capsule Take 1 capsule (40 mg total) by mouth 2 (two) times daily for 14 days. 05/15/19 05/29/19  Lin Landsman, MD  pantoprazole (PROTONIX) 40 MG tablet Take 1 tablet (40 mg total) by mouth 2 (two) times daily before a meal. 04/07/19 04/06/20  [name], MD  propranolol (INDERAL) 20 MG tablet TAKE 1 TABLET BY MOUTH TWICE A DAY 11/01/19   Vanga, Tally Due, MD  propranolol (INDERAL) 20 MG tablet Take 1 tablet (20 mg total) by mouth 2 (two) times daily. 05/13/19   Lin Landsman, MD    Physical Exam: Vitals:   06/11/2020 0040 06/27/2020 0055 06/05/2020 0055 06/22/2020 0145  BP: (!) 89/62 (!) 80/63 (!) 80/63 94/63  Pulse: 64 66 66 63  Resp: (!) 21 19 19 16   Temp: 98.3 F (36.8 C) 98.3 F (36.8 C) 98.3 F (36.8 C)   TempSrc: Oral Oral Oral   SpO2:   93% 99%  Weight:      Height:         Vitals:   06/05/2020 0040 06/02/2020 0055 06/21/2020 0055 06/05/2020 0145  BP: (!) 89/62 (!) 80/63 (!) 80/63 94/63  Pulse: 64 66 66 63  Resp: (!) 21 19 19 16   Temp: 98.3 F (36.8 C) 98.3 F (36.8 C) 98.3 F (36.8 C)   TempSrc: Oral Oral Oral   SpO2:   93% 99%  Weight:      Height:          Constitutional:  Chronically ill-appearing. Not in any apparent distress HEENT:      Head: Normocephalic and atraumatic.         Eyes: PERLA, EOMI, Conjunctivae are normal. Sclera is non-icteric.        Mouth/Throat: Mucous membranes are moist.       Neck: Supple with no signs of meningismus. Cardiovascular: Regular rate and rhythm. No murmurs, gallops, or rubs. 2+ symmetrical distal pulses are present . No JVD. No LE edema Respiratory: Respiratory effort slightly increased.Lungs sounds diminished bilaterally.  Scattered wheezes Gastrointestinal: Soft, non tender, mild distention with positive bowel sounds. No rebound or guarding. Genitourinary: No CVA tenderness. Musculoskeletal: Nontender with normal range of motion in all extremities. No cyanosis, or erythema of extremities. Neurologic:  Face is symmetric. Moving all extremities. No gross focal neurologic deficits . Skin: Skin is warm, dry.  No rash or ulcers Psychiatric: Mood and affect are normal    Labs on Admission: I have personally reviewed following labs  and imaging studies  CBC: Recent Labs  Lab 05/16/2020 2111  WBC 8.8  NEUTROABS 6.5  HGB 6.4*  HCT 24.4*  MCV 72.0*  PLT 354*   Basic Metabolic Panel: Recent Labs  Lab 05/10/2020 2111  NA 142  K 4.8  CL 106  CO2 25  GLUCOSE 132*  BUN 19  CREATININE 0.56*  CALCIUM 8.7*   GFR: Estimated Creatinine Clearance: 84.4 mL/min (A) (by C-G formula based on SCr of 0.56 mg/dL (L)). Liver Function Tests: Recent Labs  Lab 05/16/2020 2111  AST 17  ALT 12  ALKPHOS 67  BILITOT 0.4  PROT 5.8*  ALBUMIN 3.3*   No results for input(s): LIPASE, AMYLASE in the last 168 hours. No results for input(s): AMMONIA in the last 168 hours. Coagulation Profile: No results for input(s): INR, PROTIME in the last 168 hours. Cardiac Enzymes: No results for input(s): CKTOTAL, CKMB, CKMBINDEX, TROPONINI in the last 168 hours. BNP (last 3 results) No results for input(s): PROBNP in the last 8760 hours. HbA1C: No results for input(s): HGBA1C in the last 72 hours. CBG: No results for input(s): GLUCAP in the last 168 hours. Lipid Profile: No results for input(s): CHOL, HDL, LDLCALC,  TRIG, CHOLHDL, LDLDIRECT in the last 72 hours. Thyroid Function Tests: No results for input(s): TSH, T4TOTAL, FREET4, T3FREE, THYROIDAB in the last 72 hours. Anemia Panel: No results for input(s): VITAMINB12, FOLATE, FERRITIN, TIBC, IRON, RETICCTPCT in the last 72 hours. Urine analysis:    Component Value Date/Time   COLORURINE AMBER (A) 05/06/2015 2119   APPEARANCEUR CLEAR (A) 05/06/2015 2119   LABSPEC 1.014 05/06/2015 2119   PHURINE 6.0 05/06/2015 2119   GLUCOSEU NEGATIVE 05/06/2015 2119   HGBUR NEGATIVE 05/06/2015 2119   [place] NEGATIVE 05/06/2015 2119   [place] NEGATIVE 05/06/2015 2119   PROTEINUR 100 (A) 05/06/2015 2119   NITRITE NEGATIVE 05/06/2015 2119   LEUKOCYTESUR NEGATIVE 05/06/2015 2119    Radiological Exams on Admission: DG Chest 2 View  Result Date: 05/22/2020 CLINICAL DATA:  Wheezing and shortness of breath. EXAM: CHEST - 2 VIEW COMPARISON:  May 12, 2015 FINDINGS: Mild, diffuse, chronic appearing increased interstitial lung markings are seen. Mild atelectasis and/or early infiltrate is seen along the infrahilar region on the right. There is a small right pleural effusion. No pneumothorax is identified. The heart size and mediastinal contours are within normal limits. The visualized skeletal structures are unremarkable. IMPRESSION: 1. Mild right infrahilar atelectasis and/or early infiltrate. 2. Small right pleural effusion. Electronically Signed   By: Virgina Norfolk M.D.   On: 05/06/2020 22:06     Assessment/Plan 59 year old male with history of alcoholic cirrhosis with portal hypertension with history of bleeding esophageal varices s/p banding in 04/2019 as well as history of gastric ulcer presenting with dyspnea on exertion, reports of melena.  Hemoglobin 6.4 and hypoxic to 78% on room air .  Acute symptomatic blood loss anemia Melena History of bleeding esophageal varices with banding 04/2019 History of gastric ulcer on endoscopy 04/2019 -Continue IV  Protonix, IV octreotide, IV Rocephin -Continue transfusion of 2 units PRBCs -Serial H&H -Close monitoring in stepdown -GI consulted.  For endoscopy in the a.m.  Alcoholic cirrhosis of liver without ascites (HCC) Thrombocytopenia History of portal hypertension with esophageal varices ([facility]) -Cirrhosis not acutely decompensated -Thrombocytopenia of 102,000 likely secondary to cirrhosis.  Liver function normal. -Hold propranolol due to hypotension    History of nonbleeding gastric ulcer on endoscopy 04/2019 -Continue Protonix IV  Acute hypoxic respiratory failure -Patient with O2 sats  78% on room air requiring 4 L to maintain sats in the mid 90s associated with tachypnea and increased work of breathing -Suspect related to mild failure related to symptomatic anemia -Expect improvement with transfusions -Supplemental oxygen to keep sats in the mid 90s -Continue to monitor closely  History of alcohol abuse -Patient states he stopped drinking 5 years ago    DVT prophylaxis: SCDs Code Status: full code  Family Communication:  none  Disposition Plan: Back to previous home environment Consults called: GI Status:At the time of admission, it appears that the appropriate admission status for this patient is INPATIENT. This is judged to be reasonable and necessary in order to provide the required intensity of service to ensure the patient's safety given the presenting symptoms, physical exam findings, and initial radiographic and laboratory data in the context of their  Comorbid conditions.   Patient requires inpatient status due to high intensity of service, high risk for further deterioration and high frequency of surveillance required.   I certify that at the point of admission it is my clinical judgment that the patient will require inpatient hospital care spanning beyond [facility]     06/05/2020, 2:47 AM

## 2020-06-01 NOTE — ED Notes (Signed)
Patient is in bed resting.

## 2020-06-01 NOTE — ED Notes (Signed)
Pt given a warm blanket and a pillow. 

## 2020-06-01 NOTE — Op Note (Signed)
[facility] Gastroenterology Patient Name: Manuel Jimenez Procedure Date: 06/29/2020 2:46 PM MRN: [id number] Account #: [id number] Date of Birth: 08-Apr-1961 Admit Type: Inpatient Age: 59 Room: [facility] ENDO ROOM 2 Gender: Male Note Status: Finalized Procedure:             Upper GI endoscopy Indications:           Suspected upper gastrointestinal bleeding Providers:             [name] MD, MD Referring MD:          [name] (Referring MD) Medicines:             Propofol per Anesthesia Complications:         No immediate complications. Procedure:             Pre-Anesthesia Assessment:                        - Prior to the procedure, a History and Physical was                         performed, and patient medications and allergies were                         reviewed. The patient's tolerance of previous                         anesthesia was also reviewed. The risks and benefits                         of the procedure and the sedation options and risks                         were discussed with the patient. All questions were                         answered, and informed consent was obtained. Prior                         Anticoagulants: The patient has taken no previous                         anticoagulant or antiplatelet agents. ASA Grade                         Assessment: III - A patient with severe systemic                         disease. After reviewing the risks and benefits, the                         patient was deemed in satisfactory condition to                         undergo the procedure.                        After obtaining informed consent, the endoscope was  passed under direct vision. Throughout the procedure,                         the patient's blood pressure, pulse, and oxygen                         saturations were monitored continuously. The Endoscope                         was introduced  through the mouth, and advanced to the                         second part of duodenum. The upper GI endoscopy was                         accomplished without difficulty. The patient tolerated                         the procedure well. Findings:      Grade III varices were found in the lower third of the esophagus. Two       bands were successfully placed with incomplete eradication of varices. A       slow ooze remained at the end of the procedure.      Multiple benign-appearing, intrinsic moderate stenoses were found in the       lower third of the esophagus. The stenoses were traversed.      Moderate portal hypertensive gastropathy was found in the entire       examined stomach.      One non-bleeding cratered gastric ulcer with no stigmata of bleeding was       found in the gastric antrum.      The examined duodenum was normal. Impression:            - Grade III esophageal varices. Incompletely                         eradicated. Banded.                        - Benign-appearing esophageal stenoses.                        - Portal hypertensive gastropathy.                        - Non-bleeding gastric ulcer with no stigmata of                         bleeding.                        - Normal examined duodenum.                        - No specimens collected. Recommendation:        - Return patient to hospital ward for ongoing care.                        - NPO.                        -  Continue present medications. Procedure Code(s):     --- Professional ---                        [id number], Esophagogastroduodenoscopy, flexible,                         transoral; with band ligation of esophageal/gastric                         varices Diagnosis Code(s):     --- Professional ---                        I85.00, Esophageal varices without bleeding                        K76.6, Portal hypertension                        K25.9, Gastric ulcer, unspecified as acute or chronic,                          without hemorrhage or perforation CPT copyright 2019 American Medical Association. All rights reserved. The codes documented in this report are preliminary and upon coder review may  be revised to meet current compliance requirements. [name] MD, MD 06/20/2020 3:08:39 PM This report has been signed electronically. Number of Addenda: 0 Note Initiated On: 06/19/2020 2:46 PM Estimated Blood Loss:  Estimated blood loss: 10 mL.      [facility]

## 2020-06-01 NOTE — ED Notes (Signed)
Tech states pt has had a couple of episodes of coughing, sats stayed 90-91% on 4L.  Increased O2 to 5.  Audible wheezing noted, breathing more labored, abdominal breathing.  Provider notified.

## 2020-06-01 NOTE — Consult Note (Signed)
Manuel Lame, MD [facility]  [address] [place], [place] [address] Phone: [phone] Fax : [phone]  Consultation  Referring Provider:     Dr. Damita Jimenez Primary Care Physician:  Manuel Hauser, DO Primary Gastroenterologist:  Dr. Marius Jimenez         Reason for Consultation:     Anemia with cirrhosis  Date of Admission:  05/27/2020 Date of Consultation:  06/19/2020         HPI:   Manuel Jimenez is a 59 y.o. male who has a significant history of alcoholic liver disease and portal hypertension with esophageal varices that were banded in the past.  The patient has been reported to be alcohol free for the last 5 years.  He did not follow-up for repeat banding as recommended.  In the emergency department he was reported to have dark stools for the last couple of days.  The patient has been progressively more obtunded since admission.  The patient also has a history of gastric ulcer and his wife states that he takes [place] powders.  The patient was found to be hypotensive and tachypneic with a oxygen saturation of 78% on admission.  The patient does have a history of having a colonoscopy in 2018 with multiple polyps found at that time.  His wife states that she does not know much of his history since he does not share the information with her.  The patient's lab work has shown:  Component     Latest Ref Rng & Units 04/26/2019 05/06/2020 06/13/2020            Hemoglobin     13.0 - 17.0 g/dL 10.8 (L) 6.4 (L) 8.0 (L)  HCT     39 - 52 % 36.0 (L) 24.4 (L) 28.9 (L)   The patient was started on IV Protonix and a octreotide drip.  He also received blood in the ER.  Past Medical History:  Diagnosis Date  . Anemia   . Cancer ([place])   . Cirrhosis ([place])   . Colon polyps   . Constipation   . Diarrhea   . Hemorrhoids   . Hypertension    in past  . Portal vein thrombosis    see chart review 06/05/15  . Shingles 09/2016  . Stomach ulcer   . Weight loss     Past Surgical History:   Procedure Laterality Date  . COLONOSCOPY N/A 06/17/2015   Procedure: COLONOSCOPY;  Surgeon: Hulen [place], MD;  Location: [place];  Service: Gastroenterology;  Laterality: N/A;  . COLONOSCOPY WITH PROPOFOL N/A 02/02/2017   Procedure: COLONOSCOPY WITH PROPOFOL;  Surgeon: Manuel Lame, MD;  Location: [place];  Service: Endoscopy;  Laterality: N/A;  . ENTEROSCOPY N/A 04/21/2018   Procedure: ENTEROSCOPY;  Surgeon: Lin Landsman, MD;  Location: [facility] ENDOSCOPY;  Service: Gastroenterology;  Laterality: N/A;  . ESOPHAGOGASTRODUODENOSCOPY N/A 05/14/2015   Procedure: ESOPHAGOGASTRODUODENOSCOPY (EGD);  Surgeon: Hulen Luster, MD;  Location[facility] ENDOSCOPY;  Service: Endoscopy;  Laterality: N/A;  . ESOPHAGOGASTRODUODENOSCOPY N/A 04/21/2018   Procedure: ESOPHAGOGASTRODUODENOSCOPY (EGD);  Surgeon: Lin Landsman, MD;  Location: [facility] ENDOSCOPY;  Service: Gastroenterology;  Laterality: N/A;  . ESOPHAGOGASTRODUODENOSCOPY N/A 04/05/2019   Procedure: ESOPHAGOGASTRODUODENOSCOPY (EGD);  Surgeon: Virgel Manifold, MD;  Location: [facility] ENDOSCOPY;  Service: Endoscopy;  Laterality: N/A;  . ESOPHAGOGASTRODUODENOSCOPY (EGD) WITH PROPOFOL N/A 02/02/2017   Procedure: ESOPHAGOGASTRODUODENOSCOPY (EGD) WITH PROPOFOL;  Surgeon: Manuel Lame, MD;  Location: [place];  Service: Endoscopy;  Laterality:  N/A;  . FLEXIBLE SIGMOIDOSCOPY N/A 04/05/2019   Procedure: FLEXIBLE SIGMOIDOSCOPY;  Surgeon: Virgel Manifold, MD;  Location: [facility] ENDOSCOPY;  Service: Endoscopy;  Laterality: N/A;  . POLYPECTOMY  02/02/2017   Procedure: POLYPECTOMY;  Surgeon: Manuel Lame, MD;  Location: [place];  Service: Endoscopy;;    Prior to Admission medications   Medication Sig Start Date End Date Taking? Authorizing Provider  FERREX 150 150 MG capsule TAKE 1 CAPSULE BY MOUTH EVERY DAY 04/23/18  Yes Verlon Au, NP  omeprazole (PRILOSEC) 40 MG capsule Take 1 capsule (40 mg total) by mouth daily before  breakfast. 05/13/19 06/23/2020 Yes Vanga, Tally Due, MD  pantoprazole (PROTONIX) 40 MG tablet Take 1 tablet (40 mg total) by mouth 2 (two) times daily before a meal. 04/07/19 06/15/2020 Yes [name], MD  propranolol (INDERAL) 20 MG tablet TAKE 1 TABLET BY MOUTH TWICE A DAY 11/01/19  Yes Vanga, Tally Due, MD  propranolol (INDERAL) 20 MG tablet Take 1 tablet (20 mg total) by mouth 2 (two) times daily. 05/13/19   Lin Landsman, MD    Family History  Problem Relation Age of Onset  . CAD Father   . Heart attack Father      Social History   Tobacco Use  . Smoking status: Current Every Day Smoker    Packs/day: 2.00    Years: 40.00    Pack years: 80.00    Types: Cigarettes  . Smokeless tobacco: Never Used  . Tobacco comment: Only successfully quit for 1 month before due to acute illness. (01/24/17 - down to 1.5 PPD)  Substance Use Topics  . Alcohol use: No    Comment: Former alcohol abuse- sober since 2016  . Drug use: No    Allergies as of 05/30/2020 - Review Complete 05/15/2020  Allergen Reaction Noted  . Lorazepam Other (See Comments) 04/06/2016  . Iron Nausea Only 01/24/2017    Review of Systems:    All systems reviewed and negative except where noted in HPI.   Physical Exam:  Vital signs in last 24 hours: Temp:  [97.4 F (36.3 C)-98.4 F (36.9 C)] 97.4 F (36.3 C) (11/01 1418) Pulse Rate:  [53-70] 70 (11/01 1418) Resp:  [15-30] 18 (11/01 1418) BP: (80-118)/(58-77) 104/66 (11/01 1418) SpO2:  [78 %-100 %] 100 % (11/01 1418) Weight:  [60.8 kg-63.2 kg] 63.2 kg (11/01 1024)   General:   Lethargic and not answering questions Head:  Normocephalic and atraumatic. Eyes:   No icterus.   Conjunctiva pink. PERRLA. Ears:  Normal auditory acuity. Neck:  Supple; no masses or thyroidomegaly Lungs: Respirations even and unlabored. Lungs clear to auscultation bilaterally.   No wheezes, crackles, or rhonchi.  Heart:  Regular rate and rhythm;  Without murmur, clicks,  rubs or gallops Abdomen:  Soft, nondistended, nontender. Normal bowel sounds. No appreciable masses or hepatomegaly.  No rebound or guarding.  Rectal:  Not performed. Msk:  Symmetrical without gross deformities.    Extremities:  Without edema, cyanosis or clubbing. Neurologic: Lethargic Skin:  Intact without significant lesions or rashes. Cervical Nodes:  No significant cervical adenopathy. Psych: Lethargic and unable to assess  LAB RESULTS: Recent Labs    05/01/2020 2111 06/22/2020 0521  WBC 8.8 8.7  HGB 6.4* 8.0*  HCT 24.4* 28.9*  PLT 102* 68*   BMET Recent Labs    05/09/2020 2111  NA 142  K 4.8  CL 106  CO2 25  GLUCOSE 132*  BUN 19  CREATININE 0.56*  CALCIUM 8.7*  LFT Recent Labs    05/16/2020 2111  PROT 5.8*  ALBUMIN 3.3*  AST 17  ALT 12  ALKPHOS 67  BILITOT 0.4   PT/INR No results for input(s): LABPROT, INR in the last 72 hours.  STUDIES: DG Chest 2 View  Result Date: 05/06/2020 CLINICAL DATA:  Wheezing and shortness of breath. EXAM: CHEST - 2 VIEW COMPARISON:  May 12, 2015 FINDINGS: Mild, diffuse, chronic appearing increased interstitial lung markings are seen. Mild atelectasis and/or early infiltrate is seen along the infrahilar region on the right. There is a small right pleural effusion. No pneumothorax is identified. The heart size and mediastinal contours are within normal limits. The visualized skeletal structures are unremarkable. IMPRESSION: 1. Mild right infrahilar atelectasis and/or early infiltrate. 2. Small right pleural effusion. Electronically Signed   By: Virgina Norfolk M.D.   On: 05/06/2020 22:06      Impression / Plan:   Assessment: Principal Problem:   Acute blood loss anemia Active Problems:   Alcoholic cirrhosis of liver without ascites (HCC)   Symptomatic anemia   Portal hypertension (HCC)   Melena   History of esophageal varices with bleeding   History of gastric ulcer   Hypoxia   Acute respiratory failure with hypoxia  (HCC)   COPD exacerbation (HCC)   PRECILIANO CASTELL is a 59 y.o. y/o male with who was admitted with a history of cirrhosis from alcohol abuse who is no longer drinking.  The patient has been taking NSAIDs.  The patient's wife is not up-to-date on his medical history due to her reporting that he does not involve her in his care.  The patient is unable to give any meaningful history or report his symptoms.  Plan:  This patient has a history of variceal bleeding in the past and has had banding and failed to return for repeat banding after his last session.  The patient now has significant anemia and will be set up for an upper endoscopy for today.  I discussed the procedure with his wife who agrees with setting up an EGD for today.  PPI IV twice daily  Continue serial CBCs and transfuse PRN Avoid NSAIDs Maintain 2 large-bore IV lines Please page GI with any acute hemodynamic changes, or signs of active GI bleeding  Thank you for involving me in the care of this patient.      LOS: 0 days   Manuel Lame, MD, [facility] 06/27/2020, 2:47 PM,  Pager [phone] 7am-5pm  Check AMION for 5pm -7am coverage and on weekends   Note: This dictation was prepared with Dragon dictation along with smaller phrase technology. Any transcriptional errors that result from this process are unintentional.

## 2020-06-01 NOTE — ED Notes (Signed)
Pt  placed  under  IVC  PAPERS  INFORMED  TAMMY  RN

## 2020-06-01 NOTE — ED Notes (Signed)
Pt found up OOB.  Pt states he wants to leave.  Pt advised not to do so and eventually redirected to bed.  Pt ripped out IV in RAC.  Pt noted to be hypoxic upon assessment and had taken off his O2.

## 2020-06-01 NOTE — Anesthesia Procedure Notes (Signed)
Procedure Name: General with mask airway Performed by: Fletcher-Harrison, Sovereign Ramiro, CRNA Pre-anesthesia Checklist: Patient identified, Emergency Drugs available, Suction available and Patient being monitored Patient Re-evaluated:Patient Re-evaluated prior to induction Oxygen Delivery Method: Simple face mask Induction Type: IV induction Placement Confirmation: positive ETCO2 and CO2 detector Dental Injury: Teeth and Oropharynx as per pre-operative assessment        

## 2020-06-01 NOTE — Consult Note (Signed)
Came to see patient today for consult but he was asleep after procedure.  Not easily arousable.  I will follow-up tomorrow.

## 2020-06-01 NOTE — Progress Notes (Signed)
Patient ID: Manuel Jimenez, male   DOB: 07-15-1961, 59 y.o.   MRN: [id number] [facility] PROGRESS NOTE  Manuel Jimenez:[id number] DOB: 30-Apr-1961 DOA: 05/29/2020 PCP: [name], DO  HPI/Subjective: Patient was involuntary committed down in the ED because he was going to leave [employer]. He was transfused a few units of blood. Came in with some shortness of breath and found to be anemic. He was also wheezing when I saw him. Some cough. Admitting physician documented melena.  Objective: Vitals:   06/10/2020 1024 06/08/2020 1155  BP: 110/75 105/60  Pulse: 68 64  Resp: (!) 22 17  Temp: 97.8 F (36.6 C) 98 F (36.7 C)  SpO2: 96% 95%    Intake/Output Summary (Last 24 hours) at 06/17/2020 1230 Last data filed at 06/26/2020 1035 Gross per 24 hour  Intake 1660 ml  Output 850 ml  Net 810 ml   Filed Weights   05/09/2020 2103 06/09/2020 1024  Weight: 60.8 kg 63.2 kg    ROS: Review of Systems  Respiratory: Positive for cough and shortness of breath.   Cardiovascular: Negative for chest pain.  Gastrointestinal: Negative for abdominal pain and vomiting.   Exam: Physical Exam HENT:     Head: Normocephalic.     Mouth/Throat:     Pharynx: No oropharyngeal exudate.  Eyes:     General: Lids are normal.     Conjunctiva/sclera: Conjunctivae normal.     Pupils: Pupils are equal, round, and reactive to light.  Cardiovascular:     Rate and Rhythm: Normal rate and regular rhythm.     Heart sounds: Normal heart sounds, S1 normal and S2 normal.  Pulmonary:     Breath sounds: Examination of the right-middle field reveals decreased breath sounds and wheezing. Examination of the left-middle field reveals decreased breath sounds and wheezing. Examination of the right-lower field reveals decreased breath sounds and wheezing. Examination of the left-lower field reveals decreased breath sounds and wheezing. Decreased breath sounds and wheezing present.  Abdominal:      Palpations: Abdomen is soft.     Tenderness: There is no abdominal tenderness.  Musculoskeletal:     Right lower leg: No swelling.     Left lower leg: No swelling.  Skin:    General: Skin is warm.     Findings: No rash.  Neurological:     Mental Status: He is alert and oriented to person, place, and time.       Data Reviewed: Basic Metabolic Panel: Recent Labs  Lab 05/06/2020 2111  NA 142  K 4.8  CL 106  CO2 25  GLUCOSE 132*  BUN 19  CREATININE 0.56*  CALCIUM 8.7*   Liver Function Tests: Recent Labs  Lab 05/26/2020 2111  AST 17  ALT 12  ALKPHOS 67  BILITOT 0.4  PROT 5.8*  ALBUMIN 3.3*    Recent Labs  Lab 06/24/2020 0605  AMMONIA 28   CBC: Recent Labs  Lab 05/29/2020 2111 06/09/2020 0521  WBC 8.8 8.7  NEUTROABS 6.5  --   HGB 6.4* 8.0*  HCT 24.4* 28.9*  MCV 72.0* 75.9*  PLT 102* 68*   Cardiac Enzymes: No results for input(s): CKTOTAL, CKMB, CKMBINDEX, TROPONINI in the last 168 hours. BNP (last 3 results) No results for input(s): BNP in the last 8760 hours.  ProBNP (last 3 results) No results for input(s): PROBNP in the last 8760 hours.  CBG: No results for input(s): GLUCAP in the last 168 hours.  Recent Results (from  the past 240 hour(s))  Respiratory Panel by RT PCR (Flu A&B, Covid) - Nasopharyngeal Swab     Status: None   Collection Time: 06/05/2020  3:07 AM   Specimen: Nasopharyngeal Swab  Result Value Ref Range Status   SARS Coronavirus 2 by RT PCR NEGATIVE NEGATIVE Final    Comment: (NOTE) SARS-CoV-2 target nucleic acids are NOT DETECTED.  The SARS-CoV-2 RNA is generally detectable in upper respiratoy specimens during the acute phase of infection. The lowest concentration of SARS-CoV-2 viral copies this assay can detect is 131 copies/mL. A negative result does not preclude SARS-Cov-2 infection and should not be used as the sole basis for treatment or other patient management decisions. A negative result may occur with  improper specimen  collection/handling, submission of specimen other than nasopharyngeal swab, presence of viral mutation(s) within the areas targeted by this assay, and inadequate number of viral copies (<131 copies/mL). A negative result must be combined with clinical observations, patient history, and epidemiological information. The expected result is Negative.  Fact Sheet for Patients:  PinkCheek.be  Fact Sheet for Healthcare Providers:  GravelBags.it  This test is no t yet approved or cleared by the [place] FDA and  has been authorized for detection and/or diagnosis of SARS-CoV-2 by FDA under an Emergency Use Authorization (EUA). This EUA will remain  in effect (meaning this test can be used) for the duration of the COVID-19 declaration under Section 564(b)(1) of the Act, 21 U.S.C. section 360bbb-3(b)(1), unless the authorization is terminated or revoked sooner.     Influenza A by PCR NEGATIVE NEGATIVE Final   Influenza B by PCR NEGATIVE NEGATIVE Final    Comment: (NOTE) The Xpert Xpress SARS-CoV-2/FLU/RSV assay is intended as an aid in  the diagnosis of influenza from Nasopharyngeal swab specimens and  should not be used as a sole basis for treatment. Nasal washings and  aspirates are unacceptable for Xpert Xpress SARS-CoV-2/FLU/RSV  testing.  Fact Sheet for Patients: PinkCheek.be  Fact Sheet for Healthcare Providers: GravelBags.it  This test is not yet approved or cleared by the [place] FDA and  has been authorized for detection and/or diagnosis of SARS-CoV-2 by  FDA under an Emergency Use Authorization (EUA). This EUA will remain  in effect (meaning this test can be used) for the duration of the  Covid-19 declaration under Section 564(b)(1) of the Act, 21  U.S.C. section 360bbb-3(b)(1), unless the authorization is  terminated or revoked. Performed at [facility], [address], [place], [place] [address]      Studies: DG Chest 2 View  Result Date: 05/17/2020 CLINICAL DATA:  Wheezing and shortness of breath. EXAM: CHEST - 2 VIEW COMPARISON:  May 12, 2015 FINDINGS: Mild, diffuse, chronic appearing increased interstitial lung markings are seen. Mild atelectasis and/or early infiltrate is seen along the infrahilar region on the right. There is a small right pleural effusion. No pneumothorax is identified. The heart size and mediastinal contours are within normal limits. The visualized skeletal structures are unremarkable. IMPRESSION: 1. Mild right infrahilar atelectasis and/or early infiltrate. 2. Small right pleural effusion. Electronically Signed   By: Virgina Norfolk M.D.   On: 05/29/2020 22:06    Scheduled Meds: . ipratropium-albuterol  3 mL Nebulization Q6H  . methylPREDNISolone (SOLU-MEDROL) injection  40 mg Intravenous Daily  . mometasone-formoterol  2 puff Inhalation BID  . pantoprazole (PROTONIX) IV  40 mg Intravenous Q24H   Continuous Infusions: . sodium chloride    . [START ON 06/02/2020] cefTRIAXone (ROCEPHIN)  IV    . octreotide  (SANDOSTATIN)    IV infusion 50 mcg/hr (06/09/2020 0258)    Assessment/Plan:  1. Acute hypoxic respiratory failure. Pulse ox 78% on room air. Continue oxygen supplementation. 2. COPD exacerbation. Start Solu-Medrol and nebulizer treatments and Dulera inhaler. 3. Acute blood loss anemia with hemoglobin of 6.4 on presentation. GI bleed. Patient n.p.o. GI evaluation. IV Protonix and octreotide infusion. Give 1 dose of Lasix since patient received 2 units of blood. Repeat hemoglobin 8.0. Patient on empiric Rocephin just in case esophageal varices. 4. Alcoholic cirrhosis of liver without ascites and thrombocytopenia. History of esophageal varices in the past. GI evaluation. Patient stated he stopped drinking 5 years ago and his wife also confirms this. Discontinue Ativan. Platelet count dipped down  to 68,000. 5. Involuntary committed from the ER. We will get psychiatric consultation.     Code Status:     Code Status Orders  (From admission, onward)         Start     Ordered   06/05/2020 0300  Full code  Continuous        06/30/2020 0302        Code Status History    Date Active Date Inactive Code Status Order ID Comments User Context   04/04/2019 5809 04/07/2019 2116 Full Code [id number]  Lang Snow, NP Inpatient   04/20/2018 2241 04/22/2018 1608 Full Code [id number]  Lance Coon, MD Inpatient   05/06/2015 2219 05/14/2015 1942 Full Code [id number]  Hower, Aaron Mose, MD ED   Advance Care Planning Activity     Family Communication: Spoke with the patient's wife on the phone Disposition Plan: Status is: Inpatient  Dispo: The patient is from: Home              Anticipated d/c is to: Home              Anticipated d/c date is: Likely will need a few more days here in the hospital.              Patient currently being treated for acute hypoxic respiratory failure and COPD exacerbation also acute blood loss anemia with GI bleed.  Consultants:  Gastroenterology  Psychiatry  Antibiotics:  . Rocephin  Time spent: 35 minutes  [place]

## 2020-06-01 NOTE — ED Notes (Signed)
Upon rounding, pt found to be standing in room, oxygen off, IV's pulled out.  Pt uncooperative and confused.  Pt lips were blue.  After much reasoning, pt finally sat down and allowed [place] to put oxygen back on and eventually got him back in bed.  Sitter with pt now.  VSS. Provider notified of situation.

## 2020-06-01 NOTE — Anesthesia Preprocedure Evaluation (Signed)
Anesthesia Evaluation  Patient identified by MRN, date of birth, ID band Patient awake and Patient confused    Reviewed: Allergy & Precautions, NPO status , Patient's Chart, lab work & pertinent test results  History of Anesthesia Complications Negative for: history of anesthetic complications  Airway Mallampati: II  TM Distance: >3 FB Neck ROM: Full    Dental  (+) Poor Dentition   Pulmonary neg sleep apnea, COPD, Current Smoker and Patient abstained from smoking.,    breath sounds clear to auscultation- rhonchi (-) wheezing      Cardiovascular hypertension, Pt. on medications (-) CAD, (-) Past MI, (-) Cardiac Stents and (-) CABG  Rhythm:Regular Rate:Normal - Systolic murmurs and - Diastolic murmurs    Neuro/Psych neg Seizures negative neurological ROS  negative psych ROS   GI/Hepatic PUD, GERD  ,(+) Cirrhosis   Esophageal Varices    ,   Endo/Other  negative endocrine ROSneg diabetes  Renal/GU negative Renal ROS     Musculoskeletal negative musculoskeletal ROS (+)   Abdominal (+) - obese,   Peds  Hematology  (+) anemia ,   Anesthesia Other Findings Past Medical History: No date: Anemia No date: Cancer ([place]) No date: Cirrhosis ([place]) No date: Colon polyps No date: Constipation No date: Diarrhea No date: Hemorrhoids No date: Hypertension     Comment:  in past No date: Portal vein thrombosis     Comment:  see chart review 06/05/15 09/2016: Shingles No date: Stomach ulcer No date: Weight loss   Reproductive/Obstetrics                             Anesthesia Physical Anesthesia Plan  ASA: III  Anesthesia Plan: General   Post-op Pain Management:    Induction: Intravenous  PONV Risk Score and Plan: 0 and Propofol infusion  Airway Management Planned: Natural Airway  Additional Equipment:   Intra-op Plan:   Post-operative Plan:   Informed Consent: I have reviewed the  patients History and Physical, chart, labs and discussed the procedure including the risks, benefits and alternatives for the proposed anesthesia with the patient or authorized representative who has indicated his/her understanding and acceptance.     Dental advisory given and Consent reviewed with POA (pt sedated, given ativan in ED, consent obtained from pt's wife)  Plan Discussed with: CRNA and Anesthesiologist  Anesthesia Plan Comments:         Anesthesia Quick Evaluation

## 2020-06-01 NOTE — Transfer of Care (Signed)
Immediate Anesthesia Transfer of Care Note  Patient: Manuel Jimenez  Procedure(s) Performed: ESOPHAGOGASTRODUODENOSCOPY (EGD) (N/A )  Patient Location: Endoscopy Unit  Anesthesia Type:General  Level of Consciousness: drowsy and responds to stimulation  Airway & Oxygen Therapy: Patient Spontanous Breathing and Patient connected to face mask oxygen  Post-op Assessment: Report given to RN and Post -op Vital signs reviewed and stable  Post vital signs: Reviewed and stable  Last Vitals:  Vitals Value Taken Time  BP 102/77 06/20/2020 1512  Temp    Pulse 106 06/28/2020 1512  Resp 20 06/19/2020 1512  SpO2 99 % 06/22/2020 1512  Vitals shown include unvalidated device data.  Last Pain:  Vitals:   06/28/2020 1418  TempSrc: Temporal  PainSc:          Complications: No complications documented.

## 2020-06-01 NOTE — Anesthesia Postprocedure Evaluation (Signed)
Anesthesia Post Note  Patient: Manuel Jimenez  Procedure(s) Performed: ESOPHAGOGASTRODUODENOSCOPY (EGD) (N/A )  Patient location during evaluation: Endoscopy Anesthesia Type: General Level of consciousness: awake and alert Pain management: pain level controlled Vital Signs Assessment: post-procedure vital signs reviewed and stable Respiratory status: spontaneous breathing, nonlabored ventilation, respiratory function stable and patient connected to nasal cannula oxygen Cardiovascular status: blood pressure returned to baseline and stable Postop Assessment: no apparent nausea or vomiting Anesthetic complications: no   No complications documented.   Last Vitals:  Vitals:   06/15/2020 1530 06/15/2020 1622  BP: 95/69 (!) 115/57  Pulse:  72  Resp:  17  Temp:    SpO2: 95%     Last Pain:  Vitals:   06/12/2020 1530  TempSrc:   PainSc: [place]

## 2020-06-01 DEATH — deceased

## 2020-06-02 ENCOUNTER — Encounter: Payer: Self-pay | Admitting: Gastroenterology

## 2020-06-02 ENCOUNTER — Inpatient Hospital Stay: Payer: 59

## 2020-06-02 DIAGNOSIS — J9601 Acute respiratory failure with hypoxia: Secondary | ICD-10-CM

## 2020-06-02 DIAGNOSIS — K7030 Alcoholic cirrhosis of liver without ascites: Principal | ICD-10-CM

## 2020-06-02 DIAGNOSIS — D62 Acute posthemorrhagic anemia: Secondary | ICD-10-CM | POA: Diagnosis not present

## 2020-06-02 DIAGNOSIS — D696 Thrombocytopenia, unspecified: Secondary | ICD-10-CM | POA: Diagnosis not present

## 2020-06-02 DIAGNOSIS — I8511 Secondary esophageal varices with bleeding: Secondary | ICD-10-CM

## 2020-06-02 DIAGNOSIS — R410 Disorientation, unspecified: Secondary | ICD-10-CM | POA: Diagnosis not present

## 2020-06-02 LAB — TYPE AND SCREEN
ABO/RH(D): A POS
Antibody Screen: NEGATIVE
Unit division: 0
Unit division: 0
Unit division: 0
Unit division: 0

## 2020-06-02 LAB — HEMOGLOBIN AND HEMATOCRIT, BLOOD
HCT: 29.5 % — ABNORMAL LOW (ref 39.0–52.0)
Hemoglobin: 8.1 g/dL — ABNORMAL LOW (ref 13.0–17.0)

## 2020-06-02 LAB — BPAM RBC
Blood Product Expiration Date: 202111102359
Blood Product Expiration Date: 202111202359
Blood Product Expiration Date: 202111202359
Blood Product Expiration Date: 202111212359
ISSUE DATE / TIME: 202111010031
ISSUE DATE / TIME: 202111010424
Unit Type and Rh: 6200
Unit Type and Rh: 6200
Unit Type and Rh: 6200
Unit Type and Rh: 6200

## 2020-06-02 LAB — BASIC METABOLIC PANEL
Anion gap: 9 (ref 5–15)
BUN: 16 mg/dL (ref 6–20)
CO2: 33 mmol/L — ABNORMAL HIGH (ref 22–32)
Calcium: 8.1 mg/dL — ABNORMAL LOW (ref 8.9–10.3)
Chloride: 101 mmol/L (ref 98–111)
Creatinine, Ser: 0.62 mg/dL (ref 0.61–1.24)
GFR, Estimated: >60 mL/min (ref >60)
Glucose, Bld: 147 mg/dL — ABNORMAL HIGH (ref 70–99)
Potassium: 4.6 mmol/L (ref 3.5–5.1)
Sodium: 143 mmol/L (ref 135–145)

## 2020-06-02 LAB — CBC
HCT: 26.8 % — ABNORMAL LOW (ref 39.0–52.0)
Hemoglobin: 7.6 g/dL — ABNORMAL LOW (ref 13.0–17.0)
MCH: 22 pg — ABNORMAL LOW (ref 26.0–34.0)
MCHC: 28.4 g/dL — ABNORMAL LOW (ref 30.0–36.0)
MCV: 77.7 fL — ABNORMAL LOW (ref 80.0–100.0)
Platelets: 44 10*3/uL — ABNORMAL LOW (ref 150–400)
RBC: 3.45 MIL/uL — ABNORMAL LOW (ref 4.22–5.81)
RDW: 23.8 % — ABNORMAL HIGH (ref 11.5–15.5)
WBC: 5.8 10*3/uL (ref 4.0–10.5)
nRBC: 0.5 % — ABNORMAL HIGH (ref 0.0–0.2)

## 2020-06-02 LAB — PREPARE RBC (CROSSMATCH)

## 2020-06-02 LAB — MRSA PCR SCREENING: MRSA by PCR: NEGATIVE

## 2020-06-02 MED ORDER — DEXMEDETOMIDINE HCL IN NACL 400 MCG/100ML IV SOLN
0.4000 ug/kg/h | INTRAVENOUS | Status: DC
  Administered 2020-06-02 – 2020-06-03 (×4): 1.2 ug/kg/h via INTRAVENOUS
  Administered 2020-06-03: 1.165 ug/kg/h via INTRAVENOUS
  Administered 2020-06-03 – 2020-06-05 (×8): 1.2 ug/kg/h via INTRAVENOUS
  Filled 2020-06-02 (×6): qty 100
  Filled 2020-06-02: qty 300
  Filled 2020-06-02 (×6): qty 100

## 2020-06-02 MED ORDER — ZIPRASIDONE MESYLATE 20 MG IM SOLR: 10.0000 mg | Freq: Once | INTRAMUSCULAR | Status: DC

## 2020-06-02 MED ORDER — MIDAZOLAM HCL 2 MG/2ML IJ SOLN: 2.0000 mg | Freq: Once | INTRAMUSCULAR | Status: AC

## 2020-06-02 MED ORDER — THIAMINE HCL 100 MG/ML IJ SOLN
500.0000 mg | Freq: Three times a day (TID) | INTRAVENOUS | Status: DC
  Administered 2020-06-02 – 2020-06-05 (×9): 500 mg via INTRAVENOUS
  Filled 2020-06-02 (×10): qty 5

## 2020-06-02 MED ORDER — FENTANYL CITRATE (PF) 100 MCG/2ML IJ SOLN: 50.0000 ug | Freq: Once | INTRAMUSCULAR | Status: AC

## 2020-06-02 MED ORDER — PANTOPRAZOLE SODIUM 40 MG IV SOLR
40.0000 mg | Freq: Two times a day (BID) | INTRAVENOUS | Status: DC
  Administered 2020-06-03 – 2020-06-05 (×6): 40 mg via INTRAVENOUS
  Filled 2020-06-02 (×6): qty 40

## 2020-06-02 MED ORDER — HALOPERIDOL LACTATE 5 MG/ML IJ SOLN
1.0000 mg | Freq: Once | INTRAMUSCULAR | Status: AC
  Administered 2020-06-02: 1 mg via INTRAVENOUS
  Filled 2020-06-02: qty 1

## 2020-06-02 MED ORDER — ACETAMINOPHEN 325 MG PO TABS
650.0000 mg | ORAL_TABLET | Freq: Once | ORAL | Status: DC
  Filled 2020-06-02: qty 2

## 2020-06-02 MED ORDER — MIDAZOLAM HCL 2 MG/2ML IJ SOLN
INTRAMUSCULAR | Status: AC
  Administered 2020-06-02: 2 mg
  Filled 2020-06-02: qty 2

## 2020-06-02 MED ORDER — ZIPRASIDONE MESYLATE 20 MG IM SOLR
10.0000 mg | INTRAMUSCULAR | Status: AC
  Administered 2020-06-02: 08:00:00 10 mg via INTRAMUSCULAR
  Filled 2020-06-02: qty 20

## 2020-06-02 MED ORDER — FENTANYL CITRATE (PF) 100 MCG/2ML IJ SOLN
INTRAMUSCULAR | Status: AC
  Administered 2020-06-02: 50 ug
  Filled 2020-06-02: qty 2

## 2020-06-02 MED ORDER — FOLIC ACID 1 MG PO TABS
1.0000 mg | ORAL_TABLET | Freq: Every day | ORAL | Status: DC
  Administered 2020-06-03: 1 mg via ORAL
  Filled 2020-06-02: qty 1

## 2020-06-02 MED ORDER — IPRATROPIUM-ALBUTEROL 0.5-2.5 (3) MG/3ML IN SOLN
3.0000 mL | Freq: Four times a day (QID) | RESPIRATORY_TRACT | Status: DC | PRN
  Administered 2020-06-03: 3 mL via RESPIRATORY_TRACT
  Filled 2020-06-02: qty 3

## 2020-06-02 MED ORDER — DEXMEDETOMIDINE HCL IN NACL 200 MCG/50ML IV SOLN
0.4000 ug/kg/h | INTRAVENOUS | Status: DC
  Administered 2020-06-02: 0.4 ug/kg/h via INTRAVENOUS
  Administered 2020-06-02: 1.2 ug/kg/h via INTRAVENOUS

## 2020-06-02 MED ORDER — FUROSEMIDE 10 MG/ML IJ SOLN
20.0000 mg | Freq: Once | INTRAMUSCULAR | Status: AC
  Administered 2020-06-02: 20 mg via INTRAVENOUS
  Filled 2020-06-02: qty 2

## 2020-06-02 MED ORDER — THIAMINE HCL 100 MG/ML IJ SOLN
100.0000 mg | Freq: Every day | INTRAMUSCULAR | Status: DC
  Administered 2020-06-02: 100 mg via INTRAVENOUS
  Filled 2020-06-02: qty 2

## 2020-06-02 MED ORDER — SODIUM CHLORIDE 0.9% IV SOLUTION: Freq: Once | INTRAVENOUS | Status: AC

## 2020-06-02 MED ORDER — CHLORHEXIDINE GLUCONATE CLOTH 2 % EX PADS
6.0000 | MEDICATED_PAD | Freq: Every day | CUTANEOUS | Status: DC
  Administered 2020-06-02 – 2020-06-05 (×4): 6 via TOPICAL

## 2020-06-02 MED ORDER — THIAMINE HCL 100 MG/ML IJ SOLN
Freq: Once | INTRAVENOUS | Status: AC
  Filled 2020-06-02: qty 1000

## 2020-06-02 MED ORDER — HALOPERIDOL LACTATE 5 MG/ML IJ SOLN: 2.0000 mg | Freq: Once | INTRAMUSCULAR | Status: DC

## 2020-06-02 NOTE — Anesthesia Postprocedure Evaluation (Signed)
Anesthesia Post Note  Patient: BYAN POPLASKI  Procedure(s) Performed: ESOPHAGOGASTRODUODENOSCOPY (EGD) (N/A )  Patient location during evaluation: Endoscopy Anesthesia Type: General Level of consciousness: awake and alert and oriented Pain management: pain level controlled Vital Signs Assessment: post-procedure vital signs reviewed and stable Respiratory status: spontaneous breathing, nonlabored ventilation and respiratory function stable Cardiovascular status: blood pressure returned to baseline and stable Postop Assessment: no signs of nausea or vomiting Anesthetic complications: no   No complications documented.   Last Vitals:  Vitals:   06/26/2020 1938 06/02/20 0007  BP:  112/78  Pulse: 66 70  Resp: 17 19  Temp: (!) 36.3 C 37.4 C  SpO2: 98% 99%    Last Pain:  Vitals:   06/02/20 0007  TempSrc: Axillary  PainSc:                  Gwendolyne Welford

## 2020-06-02 NOTE — Progress Notes (Signed)
Pt extremely agitated, pulling at iv line, oxygen tubing, tele monitor and attempting to get oob.  When redirected pt becomes combative towards staff, kicking and swinging.  Pt has skin tears to bilateral forearms from hitting staff and hitting arms against the bed rails.  On call provider has been notified several times regarding pt's behavior.  IV haldol administered twice and prn dose of seroquel administered with very little effect.  Mittens placed on both hand but pt is able to remove them with ease and appears to become even more agitated with having them on. Sitter at bedside has been very therapeutic with pt but also with little effect.

## 2020-06-02 NOTE — Progress Notes (Signed)
Pt continues to hit and kick staff. Patient continues to injure self by thrashing around bed and scratching at skin. Multiple bleeding skin tears present. Pt confused and unable to be re-directed. Pt refused VS.

## 2020-06-02 NOTE — Progress Notes (Signed)
Manuel Lame, MD [facility]   [address] [place], [place] [address] Phone: [phone] Fax : [phone]   Subjective: The patient is still obtunded and confused.  He was moved to the ICU after becoming agitated.  The patient is not able to give any history.  The patient underwent an EGD yesterday that showed esophageal varices with cherry red spots consistent with recent bleeding.  The patient's hemoglobin has remained stable overnight.   Objective: Vital signs in last 24 hours: Vitals:   06/02/20 1500 06/02/20 1515 06/02/20 1522 06/02/20 1530  BP: (!) 91/46 (!) 91/44 (!) 90/46 (!) 89/50  Pulse: (!) 51 (!) 51 (!) 49 (!) 50  Resp: 10 10 11 14   Temp: 97.7 F (36.5 C)  (!) 96.4 F (35.8 C)   TempSrc: Axillary  Axillary   SpO2: 95% 95% 95% 96%  Weight:      Height:       Weight change: 2.418 kg  Intake/Output Summary (Last 24 hours) at 06/02/2020 1603 Last data filed at 06/02/2020 1522 Gross per 24 hour  Intake 1200.24 ml  Output 1025 ml  Net 175.24 ml     Exam: General: Resting comfortably nonresponsive.   Lab Results: @LABTEST2 @ Micro Results: Recent Results (from the past 240 hour(s))  Respiratory Panel by RT PCR (Flu A&B, Covid) - Nasopharyngeal Swab     Status: None   Collection Time: 06/26/2020  3:07 AM   Specimen: Nasopharyngeal Swab  Result Value Ref Range Status   SARS Coronavirus 2 by RT PCR NEGATIVE NEGATIVE Final    Comment: (NOTE) SARS-CoV-2 target nucleic acids are NOT DETECTED.  The SARS-CoV-2 RNA is generally detectable in upper respiratoy specimens during the acute phase of infection. The lowest concentration of SARS-CoV-2 viral copies this assay can detect is 131 copies/mL. A negative result does not preclude SARS-Cov-2 infection and should not be used as the sole basis for treatment or other patient management decisions. A negative result may occur with  improper specimen collection/handling, submission of specimen other than  nasopharyngeal swab, presence of viral mutation(s) within the areas targeted by this assay, and inadequate number of viral copies (<131 copies/mL). A negative result must be combined with clinical observations, patient history, and epidemiological information. The expected result is Negative.  Fact Sheet for Patients:  PinkCheek.be  Fact Sheet for Healthcare Providers:  GravelBags.it  This test is no t yet approved or cleared by the [place] FDA and  has been authorized for detection and/or diagnosis of SARS-CoV-2 by FDA under an Emergency Use Authorization (EUA). This EUA will remain  in effect (meaning this test can be used) for the duration of the COVID-19 declaration under Section 564(b)(1) of the Act, 21 U.S.C. section 360bbb-3(b)(1), unless the authorization is terminated or revoked sooner.     Influenza A by PCR NEGATIVE NEGATIVE Final   Influenza B by PCR NEGATIVE NEGATIVE Final    Comment: (NOTE) The Xpert Xpress SARS-CoV-2/FLU/RSV assay is intended as an aid in  the diagnosis of influenza from Nasopharyngeal swab specimens and  should not be used as a sole basis for treatment. Nasal washings and  aspirates are unacceptable for Xpert Xpress SARS-CoV-2/FLU/RSV  testing.  Fact Sheet for Patients: PinkCheek.be  Fact Sheet for Healthcare Providers: GravelBags.it  This test is not yet approved or cleared by the [place] FDA and  has been authorized for detection and/or diagnosis of SARS-CoV-2 by  FDA under an Emergency Use Authorization (EUA). This EUA will remain  in  effect (meaning this test can be used) for the duration of the  Covid-19 declaration under Section 564(b)(1) of the Act, 21  U.S.C. section 360bbb-3(b)(1), unless the authorization is  terminated or revoked. Performed at [facility], [address] [place], [place]  [address]    Studies/Results: DG Chest 2 View  Result Date: 05/27/2020 CLINICAL DATA:  Wheezing and shortness of breath. EXAM: CHEST - 2 VIEW COMPARISON:  May 12, 2015 FINDINGS: Mild, diffuse, chronic appearing increased interstitial lung markings are seen. Mild atelectasis and/or early infiltrate is seen along the infrahilar region on the right. There is a small right pleural effusion. No pneumothorax is identified. The heart size and mediastinal contours are within normal limits. The visualized skeletal structures are unremarkable. IMPRESSION: 1. Mild right infrahilar atelectasis and/or early infiltrate. 2. Small right pleural effusion. Electronically Signed   By: Virgina Norfolk M.D.   On: 05/04/2020 22:06   CT HEAD WO CONTRAST  Result Date: 06/02/2020 CLINICAL DATA:  Mental status changes of unknown cause. Thrombocytopenia. EXAM: CT HEAD WITHOUT CONTRAST TECHNIQUE: Contiguous axial images were obtained from the base of the skull through the vertex without intravenous contrast. COMPARISON:  05/08/2015 FINDINGS: Brain: Normal appearance without evidence of old or acute infarction, mass lesion, hemorrhage, hydrocephalus or extra-axial collection. Vascular: There is atherosclerotic calcification of the major vessels at the base of the brain. Skull: No skull fracture. Sinuses/Orbits: Minimal mucosal thickening of the maxillary sinuses. Orbits negative. Other: Nonspecific occipital region scalp soft tissue swelling. IMPRESSION: 1. No acute intracranial finding. Normal appearance of the brain itself. 2. Nonspecific occipital region scalp soft tissue swelling. Electronically Signed   By: Nelson Chimes M.D.   On: 06/02/2020 10:23   Medications: I have reviewed the patient's current medications. Scheduled Meds: . acetaminophen  650 mg Oral Once  . Chlorhexidine Gluconate Cloth  6 each Topical Daily  . fluticasone furoate-vilanterol  1 puff Inhalation Daily  . influenza vac split quadrivalent PF  0.5 mL  Intramuscular Tomorrow-1000  . ipratropium-albuterol  3 mL Nebulization Q6H  . pantoprazole (PROTONIX) IV  40 mg Intravenous Q24H  . pneumococcal 23 valent vaccine  0.5 mL Intramuscular Tomorrow-1000   Continuous Infusions: . cefTRIAXone (ROCEPHIN)  IV Stopped (06/02/20 0326)  . dexmedetomidine (PRECEDEX) IV infusion 1.2 mcg/kg/hr (06/02/20 1555)  . octreotide  (SANDOSTATIN)    IV infusion 50 mcg/hr (06/02/20 1500)  . thiamine injection 500 mg (06/02/20 1556)   PRN Meds:.haloperidol lactate, morphine injection, ondansetron **OR** ondansetron (ZOFRAN) IV, QUEtiapine   Assessment: Principal Problem:   Acute blood loss anemia Active Problems:   Delirium   Alcoholic cirrhosis of liver without ascites (HCC)   Symptomatic anemia   Portal hypertension (HCC)   Melena   History of esophageal varices with bleeding   History of gastric ulcer   Hypoxia   Acute respiratory failure with hypoxia (HCC)   COPD exacerbation ([place])    Plan: This patient has end-stage liver disease with alcoholic cirrhosis and a report of quitting drinking 5 years ago.  The patient is accompanied by his wife and the situation has been explained to her including his significant thrombocytopenia, esophageal varices status post banding with 2 bands yesterday and hepatic encephalopathy.  She has also been told that his prognosis is guarded.  The patient is on Protonix and octreotide.  He is also getting empiric Rocephin.  The patient is unable to take any oral meds due to his obtunded state therefore lactulose or Xifaxan could not be used.  The acute GI bleed likely exacerbated his hepatic encephalopathy and hopefully he will improve with the resolution of his GI bleeding.   LOS: 1 day   Lewayne Bunting 06/02/2020, 4:03 PM Pager [phone] 7am-5pm  Check AMION for 5pm -7am coverage and on weekends

## 2020-06-02 NOTE — Progress Notes (Signed)
Patient ID: Manuel Jimenez, male   DOB: 1961/03/15, 59 y.o.   MRN: [id number] [facility] PROGRESS NOTE  Manuel Jimenez [id number] DOB: 04/11/1961 DOA: 05/30/2020 PCP: [name], DO  HPI/Subjective: Called by nursing staff that he was agitated all last night.  This morning he yanked out the IV and was bleeding from the left antecubital.  Patient needed constant orienting.  He was pulling off the mittens and trying to get out of bed.  Admitted yesterday with a GI bleed.  Objective: Vitals:   06/18/2020 1938 06/02/20 0007  BP:  112/78  Pulse: 66 70  Resp: 17 19  Temp: (!) 97.4 F (36.3 C) 99.3 F (37.4 C)  SpO2: 98% 99%    Intake/Output Summary (Last 24 hours) at 06/02/2020 0733 Last data filed at 06/02/2020 5974 Gross per 24 hour  Intake 1117.63 ml  Output 1375 ml  Net -257.37 ml   Filed Weights   05/17/2020 2103 06/18/2020 1024  Weight: 60.8 kg 63.2 kg    ROS: Review of Systems  Unable to perform ROS: Acuity of condition   Exam: Physical Exam HENT:     Head: Normocephalic.     Mouth/Throat:     Comments: Unable to look in the mouth currently Eyes:     General: Lids are normal.     Conjunctiva/sclera: Conjunctivae normal.  Cardiovascular:     Rate and Rhythm: Normal rate and regular rhythm.     Heart sounds: Normal heart sounds, S1 normal and S2 normal.  Pulmonary:     Breath sounds: Examination of the right-lower field reveals decreased breath sounds. Examination of the left-lower field reveals decreased breath sounds. Decreased breath sounds present. No wheezing, rhonchi or rales.  Abdominal:     Palpations: Abdomen is soft.     Tenderness: There is no abdominal tenderness.  Musculoskeletal:     Right lower leg: No swelling.     Left lower leg: No swelling.  Skin:    General: Skin is warm.     Findings: No rash.     Comments: Bleeding left antecubital  Neurological:     Mental Status: He is disoriented.     Comments: Patient moving all  of his extremities on his own       Data Reviewed: Basic Metabolic Panel: Recent Labs  Lab 05/18/2020 2111 06/02/20 0521  NA 142 143  K 4.8 4.6  CL 106 101  CO2 25 33*  GLUCOSE 132* 147*  BUN 19 16  CREATININE 0.56* 0.62  CALCIUM 8.7* 8.1*   Liver Function Tests: Recent Labs  Lab 05/12/2020 2111  AST 17  ALT 12  ALKPHOS 67  BILITOT 0.4  PROT 5.8*  ALBUMIN 3.3*   Recent Labs  Lab 06/02/2020 0605  AMMONIA 28   CBC: Recent Labs  Lab 05/25/2020 2111 06/17/2020 0521 06/02/20 0521  WBC 8.8 8.7 5.8  NEUTROABS 6.5  --   --   HGB 6.4* 8.0* 7.6*  HCT 24.4* 28.9* 26.8*  MCV 72.0* 75.9* 77.7*  PLT 102* 68* 44*     Recent Results (from the past 240 hour(s))  Respiratory Panel by RT PCR (Flu A&B, Covid) - Nasopharyngeal Swab     Status: None   Collection Time: 06/21/2020  3:07 AM   Specimen: Nasopharyngeal Swab  Result Value Ref Range Status   SARS Coronavirus 2 by RT PCR NEGATIVE NEGATIVE Final    Comment: (NOTE) SARS-CoV-2 target nucleic acids are NOT DETECTED.  The SARS-CoV-2 RNA is  generally detectable in upper respiratoy specimens during the acute phase of infection. The lowest concentration of SARS-CoV-2 viral copies this assay can detect is 131 copies/mL. A negative result does not preclude SARS-Cov-2 infection and should not be used as the sole basis for treatment or other patient management decisions. A negative result may occur with  improper specimen collection/handling, submission of specimen other than nasopharyngeal swab, presence of viral mutation(s) within the areas targeted by this assay, and inadequate number of viral copies (<131 copies/mL). A negative result must be combined with clinical observations, patient history, and epidemiological information. The expected result is Negative.  Fact Sheet for Patients:  PinkCheek.be  Fact Sheet for Healthcare Providers:  GravelBags.it  This test  is no t yet approved or cleared by the [place] FDA and  has been authorized for detection and/or diagnosis of SARS-CoV-2 by FDA under an Emergency Use Authorization (EUA). This EUA will remain  in effect (meaning this test can be used) for the duration of the COVID-19 declaration under Section 564(b)(1) of the Act, 21 U.S.C. section 360bbb-3(b)(1), unless the authorization is terminated or revoked sooner.     Influenza A by PCR NEGATIVE NEGATIVE Final   Influenza B by PCR NEGATIVE NEGATIVE Final    Comment: (NOTE) The Xpert Xpress SARS-CoV-2/FLU/RSV assay is intended as an aid in  the diagnosis of influenza from Nasopharyngeal swab specimens and  should not be used as a sole basis for treatment. Nasal washings and  aspirates are unacceptable for Xpert Xpress SARS-CoV-2/FLU/RSV  testing.  Fact Sheet for Patients: PinkCheek.be  Fact Sheet for Healthcare Providers: GravelBags.it  This test is not yet approved or cleared by the [place] FDA and  has been authorized for detection and/or diagnosis of SARS-CoV-2 by  FDA under an Emergency Use Authorization (EUA). This EUA will remain  in effect (meaning this test can be used) for the duration of the  Covid-19 declaration under Section 564(b)(1) of the Act, 21  U.S.C. section 360bbb-3(b)(1), unless the authorization is  terminated or revoked. Performed at [facility], [address] [place], [place] [address]      Studies: DG Chest 2 View  Result Date: 05/19/2020 CLINICAL DATA:  Wheezing and shortness of breath. EXAM: CHEST - 2 VIEW COMPARISON:  May 12, 2015 FINDINGS: Mild, diffuse, chronic appearing increased interstitial lung markings are seen. Mild atelectasis and/or early infiltrate is seen along the infrahilar region on the right. There is a small right pleural effusion. No pneumothorax is identified. The heart size and mediastinal contours are  within normal limits. The visualized skeletal structures are unremarkable. IMPRESSION: 1. Mild right infrahilar atelectasis and/or early infiltrate. 2. Small right pleural effusion. Electronically Signed   By: Virgina Norfolk M.D.   On: 05/11/2020 22:06    Scheduled Meds: . sodium chloride   Intravenous Once  . acetaminophen  650 mg Oral Once  . fluticasone furoate-vilanterol  1 puff Inhalation Daily  . furosemide  20 mg Intravenous Once  . influenza vac split quadrivalent PF  0.5 mL Intramuscular Tomorrow-1000  . ipratropium-albuterol  3 mL Nebulization Q6H  . pantoprazole (PROTONIX) IV  40 mg Intravenous Q24H  . pneumococcal 23 valent vaccine  0.5 mL Intramuscular Tomorrow-1000  . ziprasidone  10 mg Intramuscular STAT   Continuous Infusions: . cefTRIAXone (ROCEPHIN)  IV Stopped (06/02/20 0326)  . dexmedetomidine (PRECEDEX) IV infusion    . octreotide  (SANDOSTATIN)    IV infusion 50 mcg/hr (06/02/20 0400)    Assessment/Plan:  1. Acute delirium and agitation.  Patient pulling out IVs and bleeding into the bed.  Will have to be transferred to stepdown unit for Precedex drip.  Will give 1 dose of IM Geodon now.  Wife states that he has the opposite effect with Ativan so I will not use that.  Patient has as needed Haldol in the computer also.  Ammonia level only 28 so this is not hepatic encephalopathy.  We will give IV thiamine. 2. Alcoholic cirrhosis of the liver without ascites and thrombocytopenia.  Endoscopy showed grade 3 esophageal varices with oozing.  Currently n.p.o.  Since hemoglobin drifted down to 7.6 I will give a unit of blood today if able to get an IV in.  Since platelets dropped down to 44 today I will give a unit of platelets today if able to get an IV in. 3. Acute blood loss anemia secondary to esophageal variceal bleed.  Patient with a hemoglobin of 6.4 on presentation.  Received 2 units of packed red blood cells on admission and will give another unit of blood today.   Patient on Protonix IV pushes and octreotide infusion if able to get an IV.  On empiric Rocephin. 4. Acute hypoxic respiratory failure with pulse ox of 78% on room air.  Continue oxygen supplementation 5. COPD exacerbation.  Not wheezing today I will discontinue Solu-Medrol since delirious.  Continue nebulizer treatments and inhaler. 6. Involuntary committed in the ER.  Psychiatric consultation ordered.     Code Status:     Code Status Orders  (From admission, onward)         Start     Ordered   06/08/2020 0300  Full code  Continuous        06/27/2020 0302        Code Status History    Date Active Date Inactive Code Status Order ID Comments User Context   04/04/2019 7412 04/07/2019 2116 Full Code [id number]  Lang Snow, NP Inpatient   04/20/2018 2241 04/22/2018 1608 Full Code [id number]  Lance Coon, MD Inpatient   05/06/2015 2219 05/14/2015 1942 Full Code [id number]  Hower, Aaron Mose, MD ED   Advance Care Planning Activity     Family Communication: Spoke with the patient's wife on the phone Disposition Plan: Status is: Inpatient  Dispo: The patient is from: Home              Anticipated d/c is to: Home              Anticipated d/c date is: With acute delirium and transferred to the stepdown unit will need likely 4 more days here for delirium to clear              Patient currently with acute delirium and being transferred to the stepdown unit  Consultants:  Critical care specialist  Gastroenterology  Psychiatry  Procedures:  EGD  Antibiotics:  Rocephin  Time spent: 28 minutes  [place]

## 2020-06-02 NOTE — Progress Notes (Signed)
  Chaplain On-Call responded to page from [employer] to visit and support the patient's wife.  Chaplain met the patient's wife at bedside. Learned from her about patient's difficult medical conditions. Received her question about whether her Doristine Bosworth would be permitted to come in and visit.  Per Nurses Raquel Sarna and Judson Roch, this Chaplain confirmed the hospital policy of welcoming a Doristine Bosworth and not having him/her count as a daily visitor.  Nurses will share this information with the patient's wife.  Robinson Keyontay Stolz M.Div., [facility]

## 2020-06-02 NOTE — Consult Note (Signed)
NAME:  Manuel Jimenez, MRN:  [id number], DOB:  January 26, 1961, LOS: 1 ADMISSION DATE:  05/27/2020, CONSULTATION DATE:  06/02/2020 REFERRING MD:  Dr. Leslye Peer, CHIEF COMPLAINT:  Dyspnea & weakness  Brief History   59 yo male with alcoholic cirrhosis and bleeding esophageal varices presenting with worsening dyspnea on exertion and dark tarry stools for a few days, experiencing withdrawl ultimately requiring precedex drip and Stepdown admission.  History of present illness   59 yo male with alcoholic cirrhosis and bleeding esophageal varices presenting with worsening dyspnea on exertion and dark tarry stools for a few days. Upon arrival to the ED the patient was tacypneic and hypoxic at 78% on room air, with soft BP 91/58 requiring acute oxygen of 2-4 L [place].  Labs significant for Hgb 6.4, platelets 102, CXR showing mild right infrahilar atelectasis and a small right pleural effusion. ED workup included initiation of IV protonix & octreotide, also received one unit of PRBC's and was admitted to a med-surgical inpatient bed for further monitoring on 05/05/2020.  Overnight on 06/13/2020 the patient experiencing suspected withdrawal from alcohol became increasingly agitated, pulling out his IV and repeatedly attempting to get up out of bed, ultimately requiring precedex drip and Stepdown admission and PCCM consultation for further monitoring.  Past Medical History  Alcoholic cirrhosis  Hypertension  Portal vein hypertension Gastric Ulcer Bleeding Esophageal Varices - s/p banding  Significant Hospital Events   06/11/2020 EGD 06/02/20 Admit to ICU  Consults:  Gasteroenterolgy  Procedures:  06/26/2020 EGD  Significant Diagnostic Tests:  06/19/2020 Upper GI endoscopy > grade III varices found in lower third of the esophagus.  2 bands placed with incomplete eradication of varices. Moderate portal hypertension gastropathy was found in the entire stomach.  1 nonbleeding cratered gastric ulcer with no stigmata was  found in the gastric antrum.  Benign-appearing esophageal stenoses 06/02/20 CT head w/o contrast > negative  Micro Data:  06/18/2020 COVID-19 >> negative 06/09/2020 Influenza A/B >> negative 06/02/20 MRSA PCR >> negative  Antimicrobials:  06/15/2020 Ceftriaxone >>   Interim history/subjective:  Patient resting comfortably, sedated on precedex drip. Unable to participate in exam in a meaningful way.  Labs/ Imaging personally reviewed Na+/ K+: 143/ 4.6 BUN/Cr.: 16/ 0.62 Hgb: 7.6 ~ 8.1 WBC/ TMAX: 5.8/ 36.5  CXR 10/31: mild R infrahilar atelectasis and/or early infiltrate. Small pleural effusion.  Objective   Blood pressure 117/82, pulse 61, temperature (!) 96.4 F (35.8 C), temperature source Axillary, resp. rate (!) 26, height 5\' 8"  (1.727 m), weight 63.2 kg, SpO2 95 %.        Intake/Output Summary (Last 24 hours) at 06/02/2020 1926 Last data filed at 06/02/2020 1800 Gross per 24 hour  Intake 1379.86 ml  Output 1175 ml  Net 204.86 ml   Filed Weights   05/14/2020 2103 06/07/2020 1024  Weight: 60.8 kg 63.2 kg    Examination: General: Adult male, frail appearing & critically ill, lying in bed sedated, NAD HEENT: MM pink/moist, anicteric, atraumatic, neck supple Neuro: Sedated on precedex, unable to follow commands, PERRL +2, MAE CV: s1s2 , SB on monitor, no r/m/g Pulm: Regular, non labored on 4 L [place], breath sounds clear- BUL & diminished- BLL GI: soft, flat, bs x 4 GU: condom cath in place with clear yellow urine Skin: BUE wounds, scattered bruising and abrasions Extremities: warm/dry, pulses + 2 R/P, no edema noted  Resolved Hospital Problem list     Assessment & Plan:  Acute blood loss anemia secondary to esophageal variceal bleed  Symptomatic anemia Thrombocytopenia PMHx: Alcoholic cirrhosis, portal vein hypertension, bleeding esophageal varices Patient complained of a several day history of shortness of breath on exertion and dark tarry stools.  Hemoglobin on arrival was 6.4,  patient has received a total of 3 units packed red blood cells and 1 unit of platelets since admission.  Hemoglobin stable at 8.1.  Grade 3 varices noted on EGD, 2 bands placed. -GI consulted, appreciate input -Continue IV Protonix twice daily, IV octreotide drip -Continue ceftriaxone for SBP coverage, per GI -SCDs for DVT prophylaxis due to bleeding - Monitor for s/s of bleeding -Daily CBC, serial H&H -Transfuse for Hgb <7 -home medication: propanolol on hold due to hypotension, consider restarting once stabilized  Acute Delirium secondary to suspected Alcohol withdrawal Involuntary commitment on admission PMHx: ETOH abuse On admission patient reports that he stopped drinking 5 years ago, per previous documentation wife agreed.  Overnight on 06/13/2020 patient became very agitated, hitting/ kicking/ swinging, ultimately injuring himself causing skin tears on bilateral forearms.  Haldol and Geodon given with minimal effect, patient transferred to SDU for precedex drip. Ativan per wife causes patient to become more agitated. - continue precedex drip, wean as tolerated - continue safety sitter - Haldol PRN Q 6 - psych consulted, appreciate input - continue thiamine & folic acid, banana bag ordered x 1 dose  Acute hypoxic respiratory failure in the setting of symptomatic anemia COPD exacerbation PMHx: tobacco use Upon arrival to the ED the patient was tacypneic and hypoxic at 78% on room air, with soft BP 91/58 requiring acute oxygen of 2-4 L [place]. Audible wheezing on admission that cleared with nebulizer administration. Solu-medrol discontinued due to agitation. - supplemental O2 to maintain SpO2 > 90% - intermittent CXR, ABG PRN - continue duo-nebs Q 6 PRN, ICS daily - aggressive pulmonary hygiene & mobilization as tolerated  Best practice:  Diet: NPO Pain/Anxiety/Delirium protocol (if indicated): precedex drip & morphine VAP protocol (if indicated): N/A DVT prophylaxis: SCD's GI  prophylaxis: protonix Glucose control: Q 4, monitor Mobility: bedrest, mobilize as tolerated Code Status: FULL Family Communication: day time will update patient and family PRN, patient sedated Disposition: SDU  Labs   CBC: Recent Labs  Lab 05/24/2020 2111 06/28/2020 0521 06/02/20 0521  WBC 8.8 8.7 5.8  NEUTROABS 6.5  --   --   HGB 6.4* 8.0* 7.6*  HCT 24.4* 28.9* 26.8*  MCV 72.0* 75.9* 77.7*  PLT 102* 68* 44*    Basic Metabolic Panel: Recent Labs  Lab 05/02/2020 2111 06/02/20 0521  NA 142 143  K 4.8 4.6  CL 106 101  CO2 25 33*  GLUCOSE 132* 147*  BUN 19 16  CREATININE 0.56* 0.62  CALCIUM 8.7* 8.1*   GFR: Estimated Creatinine Clearance: 87.8 mL/min (by C-G formula based on SCr of 0.62 mg/dL). Recent Labs  Lab 05/01/2020 2111 06/07/2020 0521 06/02/20 0521  WBC 8.8 8.7 5.8    Liver Function Tests: Recent Labs  Lab 05/10/2020 2111  AST 17  ALT 12  ALKPHOS 67  BILITOT 0.4  PROT 5.8*  ALBUMIN 3.3*   No results for input(s): LIPASE, AMYLASE in the last 168 hours. Recent Labs  Lab 06/05/2020 0605  AMMONIA 28    ABG    Component Value Date/Time   PHART 7.42 05/09/2015 1020   PCO2ART 43 05/09/2015 1020   PO2ART 55 (L) 05/09/2015 1020   HCO3 27.9 05/09/2015 1020   ACIDBASEDEF 1.7 05/09/2015 0404   O2SAT 88.8 05/09/2015 1020     Coagulation  Profile: No results for input(s): INR, PROTIME in the last 168 hours.  Cardiac Enzymes: No results for input(s): CKTOTAL, CKMB, CKMBINDEX, TROPONINI in the last 168 hours.  HbA1C: No results found for: HGBA1C  CBG: No results for input(s): GLUCAP in the last 168 hours.  Review of Systems:   UTA- patient sedated on precedex drip  Past Medical History  He,  has a past medical history of Anemia, Cancer ([place]), Cirrhosis ([place]), Colon polyps, Constipation, Diarrhea, Hemorrhoids, Hypertension, Portal vein thrombosis, Shingles (09/2016), Stomach ulcer, and Weight loss.   Surgical History    Past Surgical History:    Procedure Laterality Date  . COLONOSCOPY N/A 06/17/2015   Procedure: COLONOSCOPY;  Surgeon: Hulen Luster, MD;  Location: [facility];  Service: Gastroenterology;  Laterality: N/A;  . COLONOSCOPY WITH PROPOFOL N/A 02/02/2017   Procedure: COLONOSCOPY WITH PROPOFOL;  Surgeon: [name], MD;  Location: [place];  Service: Endoscopy;  Laterality: N/A;  . ENTEROSCOPY N/A 04/21/2018   Procedure: ENTEROSCOPY;  Surgeon: Lin Landsman, MD;  Location: [facility] ENDOSCOPY;  Service: Gastroenterology;  Laterality: N/A;  . ESOPHAGOGASTRODUODENOSCOPY N/A 05/14/2015   Procedure: ESOPHAGOGASTRODUODENOSCOPY (EGD);  Surgeon: Hulen Luster, MD;  Location: [facility] ENDOSCOPY;  Service: Endoscopy;  Laterality: N/A;  . ESOPHAGOGASTRODUODENOSCOPY N/A 04/21/2018   Procedure: ESOPHAGOGASTRODUODENOSCOPY (EGD);  Surgeon: Lin Landsman, MD;  Location[facility] ENDOSCOPY;  Service: Gastroenterology;  Laterality: N/A;  . ESOPHAGOGASTRODUODENOSCOPY N/A 04/05/2019   Procedure: ESOPHAGOGASTRODUODENOSCOPY (EGD);  Surgeon: Virgel Manifold, MD;  Location: [facility] ENDOSCOPY;  Service: Endoscopy;  Laterality: N/A;  . ESOPHAGOGASTRODUODENOSCOPY N/A 06/03/2020   Procedure: ESOPHAGOGASTRODUODENOSCOPY (EGD);  Surgeon: [name], MD;  Location: [facility] ENDOSCOPY;  Service: Endoscopy;  Laterality: N/A;  . ESOPHAGOGASTRODUODENOSCOPY (EGD) WITH PROPOFOL N/A 02/02/2017   Procedure: ESOPHAGOGASTRODUODENOSCOPY (EGD) WITH PROPOFOL;  Surgeon: [name], MD;  Location: [place];  Service: Endoscopy;  Laterality: N/A;  . FLEXIBLE SIGMOIDOSCOPY N/A 04/05/2019   Procedure: FLEXIBLE SIGMOIDOSCOPY;  Surgeon: Virgel Manifold, MD;  Location: [facility] ENDOSCOPY;  Service: Endoscopy;  Laterality: N/A;  . POLYPECTOMY  02/02/2017   Procedure: POLYPECTOMY;  Surgeon: [name], MD;  Location: [place];  Service: Endoscopy;;     Social History   reports that he has been smoking cigarettes. He has a 80.00 pack-year smoking  history. He has never used smokeless tobacco. He reports that he does not drink alcohol and does not use drugs.   Family History   His family history includes CAD in his father; Heart attack in his father.   Allergies Allergies  Allergen Reactions  . Lorazepam Other (See Comments)    "made me anxious and mean"   . Iron Nausea Only     Home Medications  Prior to Admission medications   Medication Sig Start Date End Date Taking? Authorizing Provider  FERREX 150 150 MG capsule TAKE 1 CAPSULE BY MOUTH EVERY DAY 04/23/18  Yes Verlon Au, NP  omeprazole (PRILOSEC) 40 MG capsule Take 1 capsule (40 mg total) by mouth daily before breakfast. 05/13/19 06/10/2020 Yes Vanga, Tally Due, MD  pantoprazole (PROTONIX) 40 MG tablet Take 1 tablet (40 mg total) by mouth 2 (two) times daily before a meal. 04/07/19 06/10/2020 Yes [name], MD  propranolol (INDERAL) 20 MG tablet TAKE 1 TABLET BY MOUTH TWICE A DAY 11/01/19  Yes Vanga, Tally Due, MD  propranolol (INDERAL) 20 MG tablet Take 1 tablet (20 mg total) by mouth 2 (two) times daily. 05/13/19   Lin Landsman, MD  Critical care time: 35 minutes       Domingo Pulse [place], AGACNP-BC [facility]    Please see Amion for pager details.

## 2020-06-03 ENCOUNTER — Inpatient Hospital Stay: Payer: 59

## 2020-06-03 DIAGNOSIS — D62 Acute posthemorrhagic anemia: Secondary | ICD-10-CM | POA: Diagnosis not present

## 2020-06-03 DIAGNOSIS — J9601 Acute respiratory failure with hypoxia: Secondary | ICD-10-CM | POA: Diagnosis not present

## 2020-06-03 DIAGNOSIS — D649 Anemia, unspecified: Secondary | ICD-10-CM | POA: Diagnosis not present

## 2020-06-03 DIAGNOSIS — R410 Disorientation, unspecified: Secondary | ICD-10-CM | POA: Diagnosis not present

## 2020-06-03 LAB — PHOSPHORUS: Phosphorus: 3.9 mg/dL (ref 2.5–4.6)

## 2020-06-03 LAB — TYPE AND SCREEN
ABO/RH(D): A POS
Antibody Screen: NEGATIVE
Unit division: 0

## 2020-06-03 LAB — PREPARE PLATELET PHERESIS: Unit division: 0

## 2020-06-03 LAB — CBC
HCT: 29.5 % — ABNORMAL LOW (ref 39.0–52.0)
Hemoglobin: 7.9 g/dL — ABNORMAL LOW (ref 13.0–17.0)
MCH: 21.5 pg — ABNORMAL LOW (ref 26.0–34.0)
MCHC: 26.8 g/dL — ABNORMAL LOW (ref 30.0–36.0)
MCV: 80.4 fL (ref 80.0–100.0)
Platelets: 45 10*3/uL — ABNORMAL LOW (ref 150–400)
RBC: 3.67 MIL/uL — ABNORMAL LOW (ref 4.22–5.81)
RDW: 23.2 % — ABNORMAL HIGH (ref 11.5–15.5)
WBC: 4.9 10*3/uL (ref 4.0–10.5)
nRBC: 0.4 % — ABNORMAL HIGH (ref 0.0–0.2)

## 2020-06-03 LAB — BASIC METABOLIC PANEL
Anion gap: 9 (ref 5–15)
BUN: 27 mg/dL — ABNORMAL HIGH (ref 6–20)
CO2: 34 mmol/L — ABNORMAL HIGH (ref 22–32)
Calcium: 8.1 mg/dL — ABNORMAL LOW (ref 8.9–10.3)
Chloride: 104 mmol/L (ref 98–111)
Creatinine, Ser: 0.63 mg/dL (ref 0.61–1.24)
GFR, Estimated: >60 mL/min (ref >60)
Glucose, Bld: 115 mg/dL — ABNORMAL HIGH (ref 70–99)
Potassium: 4.7 mmol/L (ref 3.5–5.1)
Sodium: 147 mmol/L — ABNORMAL HIGH (ref 135–145)

## 2020-06-03 LAB — BPAM RBC
Blood Product Expiration Date: 202111212359
ISSUE DATE / TIME: 202111021417
Unit Type and Rh: 6200

## 2020-06-03 LAB — BPAM PLATELET PHERESIS
Blood Product Expiration Date: 202111022359
ISSUE DATE / TIME: 202111021257
Unit Type and Rh: 6200

## 2020-06-03 LAB — HEMOGLOBIN AND HEMATOCRIT, BLOOD
HCT: 29.9 % — ABNORMAL LOW (ref 39.0–52.0)
HCT: 29.5 % — ABNORMAL LOW (ref 39.0–52.0)
Hemoglobin: 8.2 g/dL — ABNORMAL LOW (ref 13.0–17.0)
Hemoglobin: 8.2 g/dL — ABNORMAL LOW (ref 13.0–17.0)

## 2020-06-03 LAB — AMMONIA: Ammonia: 26 umol/L (ref 9–35)

## 2020-06-03 LAB — MAGNESIUM: Magnesium: 2 mg/dL (ref 1.7–2.4)

## 2020-06-03 MED ORDER — MIDAZOLAM HCL 2 MG/2ML IJ SOLN
2.0000 mg | Freq: Once | INTRAMUSCULAR | Status: AC
  Administered 2020-06-03: 2 mg via INTRAVENOUS

## 2020-06-03 MED ORDER — MIDAZOLAM HCL 2 MG/2ML IJ SOLN
INTRAMUSCULAR | Status: AC
  Filled 2020-06-03: qty 2

## 2020-06-03 MED ORDER — ZIPRASIDONE MESYLATE 20 MG IM SOLR
20.0000 mg | INTRAMUSCULAR | Status: AC
  Administered 2020-06-03: 20 mg via INTRAMUSCULAR
  Filled 2020-06-03: qty 20

## 2020-06-03 MED ORDER — FENTANYL CITRATE (PF) 100 MCG/2ML IJ SOLN
50.0000 ug | Freq: Once | INTRAMUSCULAR | Status: AC
  Administered 2020-06-03: 50 ug via INTRAVENOUS
  Filled 2020-06-03: qty 2

## 2020-06-03 MED ORDER — MORPHINE SULFATE (PF) 2 MG/ML IV SOLN
2.0000 mg | Freq: Once | INTRAVENOUS | Status: AC
  Administered 2020-06-03: 2 mg via INTRAVENOUS

## 2020-06-03 MED ORDER — MIDAZOLAM HCL 2 MG/2ML IJ SOLN
2.0000 mg | Freq: Once | INTRAMUSCULAR | Status: AC
  Filled 2020-06-03: qty 2

## 2020-06-03 MED ORDER — METRONIDAZOLE IN NACL 5-0.79 MG/ML-% IV SOLN
500.0000 mg | Freq: Two times a day (BID) | INTRAVENOUS | Status: DC
  Administered 2020-06-03 – 2020-06-04 (×2): 500 mg via INTRAVENOUS
  Filled 2020-06-03 (×3): qty 100

## 2020-06-03 MED ORDER — ORAL CARE MOUTH RINSE
15.0000 mL | Freq: Two times a day (BID) | OROMUCOSAL | Status: DC
  Administered 2020-06-03 – 2020-06-05 (×4): 15 mL via OROMUCOSAL

## 2020-06-03 NOTE — Progress Notes (Signed)
[place] visited pt. and wife at bedside while rounding on ICU; pt. resting/asleep at time of visit, but wife at bedside shared pt. came to hospital with liver problems and that he has been agitated.  At this time, wife shares no immediate needs.  CH remains available as needed.

## 2020-06-03 NOTE — Hospital Course (Signed)
Manuel Jimenez is a 59 y.o. male with medical history significant for alcoholic cirrhosis with portal hypertension, history of bleeding esophageal varices as well as history of gastric ulcer who presented to the ED on 05/23/2020 with a several day history of worsening shortness of breath on exertion which he stated similar to prior episodes of acute anemia.  Also reported dark tarry stools, no abdominal pain.  In the ED, BP was 91/58, RR 26, O2 sat 78% on room air needing 2-4 L/min oxygen.  Labs notable for Hbg 6.4 (10.8 a year prior), platelets 102k. Chest xray showed left lower lobe atelectasis vs consolidation and right pleural effusion.    Patient started on IV Protonix and IV octreotide as well as transfusion of pRBC's.  Admitted to hospitalist service with GI consulted.    11/1 - EGD showed grade III esophageal varices, incompletely eradicated, banded.  Portal hypertensive gastropathy, non-bleeding gastric ulcer.  Overnight 11/1-2, patient with significant agitation and combativeness, pulled out IV.  Requiring Precedex drip in ICU.

## 2020-06-03 NOTE — Consult Note (Signed)
[place] [facility] Consult   Reason for Consult:   Follow-up consult for this 59 year old man who presented to the emergency room with delirium Referring Physician: Griffis Patient Identification: Manuel Jimenez MRN:  [id number] Principal Diagnosis: Delirium Diagnosis:  Principal Problem:   Delirium Active Problems:   Alcoholic cirrhosis of liver without ascites ([place])   Symptomatic anemia   Portal hypertension ([place])   Acute blood loss anemia   Melena   History of esophageal varices with bleeding   History of gastric ulcer   Hypoxia   Acute respiratory failure with hypoxia ([place])   COPD exacerbation ([place])   Total Time spent with patient: 45 minutes  Subjective:   Manuel Jimenez is a 59 y.o. male patient admitted with patient not able to give information.  HPI: Patient seen.  Briefly spoke with family yesterday.  Chart reviewed.  60 year old man presented to the emergency room with confusion and agitation.  Now in the intensive care unit with Precedex sedation.  Family reports that the patient has a history of alcohol abuse but claims he had not been drinking anytime recently prior to presentation.  He is currently sedated and not able to give any history himself.  Was obviously confused and agitated and delirious prior to admission.  Past Psychiatric History: No psychiatric notes available.  Past history of alcohol abuse  Risk to Self:   Risk to Others:   Prior Inpatient Therapy:   Prior Outpatient Therapy:    Past Medical History:  Past Medical History:  Diagnosis Date  . Anemia   . Cancer ([place])   . Cirrhosis ([place])   . Colon polyps   . Constipation   . Diarrhea   . Hemorrhoids   . Hypertension    in past  . Portal vein thrombosis    see chart review 06/05/15  . Shingles 09/2016  . Stomach ulcer   . Weight loss     Past Surgical History:  Procedure Laterality Date  . COLONOSCOPY N/A 06/17/2015   Procedure: COLONOSCOPY;  Surgeon: Hulen Luster, MD;   Location: [place];  Service: Gastroenterology;  Laterality: N/A;  . COLONOSCOPY WITH PROPOFOL N/A 02/02/2017   Procedure: COLONOSCOPY WITH PROPOFOL;  Surgeon: [name], MD;  Location: [place];  Service: Endoscopy;  Laterality: N/A;  . ENTEROSCOPY N/A 04/21/2018   Procedure: ENTEROSCOPY;  Surgeon: Lin Landsman, MD;  Location: [facility] ENDOSCOPY;  Service: Gastroenterology;  Laterality: N/A;  . ESOPHAGOGASTRODUODENOSCOPY N/A 05/14/2015   Procedure: ESOPHAGOGASTRODUODENOSCOPY (EGD);  Surgeon: Hulen Luster, MD;  Location: [facility] ENDOSCOPY;  Service: Endoscopy;  Laterality: N/A;  . ESOPHAGOGASTRODUODENOSCOPY N/A 04/21/2018   Procedure: ESOPHAGOGASTRODUODENOSCOPY (EGD);  Surgeon: Lin Landsman, MD;  Location: [facility] ENDOSCOPY;  Service: Gastroenterology;  Laterality: N/A;  . ESOPHAGOGASTRODUODENOSCOPY N/A 04/05/2019   Procedure: ESOPHAGOGASTRODUODENOSCOPY (EGD);  Surgeon: Virgel Manifold, MD;  Location: [facility] ENDOSCOPY;  Service: Endoscopy;  Laterality: N/A;  . ESOPHAGOGASTRODUODENOSCOPY N/A 06/08/2020   Procedure: ESOPHAGOGASTRODUODENOSCOPY (EGD);  Surgeon: [name], MD;  Location: [facility] ENDOSCOPY;  Service: Endoscopy;  Laterality: N/A;  . ESOPHAGOGASTRODUODENOSCOPY (EGD) WITH PROPOFOL N/A 02/02/2017   Procedure: ESOPHAGOGASTRODUODENOSCOPY (EGD) WITH PROPOFOL;  Surgeon: [name], MD;  Location: [place];  Service: Endoscopy;  Laterality: N/A;  . FLEXIBLE SIGMOIDOSCOPY N/A 04/05/2019   Procedure: FLEXIBLE SIGMOIDOSCOPY;  Surgeon: Virgel Manifold, MD;  Location: [facility] ENDOSCOPY;  Service: Endoscopy;  Laterality: N/A;  . POLYPECTOMY  02/02/2017   Procedure: POLYPECTOMY;  Surgeon: [name], MD;  Location: [place];  Service: Endoscopy;;   Family History:  Family History  Problem Relation Age of Onset  . CAD Father   . Heart attack Father    Family Psychiatric  History: See previous Social History:  Social History   Substance and Sexual  Activity  Alcohol Use No   Comment: Former alcohol abuse- sober since 2016     Social History   Substance and Sexual Activity  Drug Use No    Social History   Socioeconomic History  . Marital status: Married    Spouse name: Not on file  . Number of children: Not on file  . Years of education: [employer]  . Highest education level: Not on file  Occupational History  . Occupation: Retired  Tobacco Use  . Smoking status: Current Every Day Smoker    Packs/day: 2.00    Years: 40.00    Pack years: 80.00    Types: Cigarettes  . Smokeless tobacco: Never Used  . Tobacco comment: Only successfully quit for 1 month before due to acute illness. (01/24/17 - down to 1.5 PPD)  Substance and Sexual Activity  . Alcohol use: No    Comment: Former alcohol abuse- sober since 2016  . Drug use: No  . Sexual activity: Not on file  Other Topics Concern  . Not on file  Social History Narrative  . Not on file   Social Determinants of Health   Financial Resource Strain:   . Difficulty of Paying Living Expenses: Not on file  Food Insecurity:   . Worried About Charity fundraiser in the Last Year: Not on file  . Ran Out of Food in the Last Year: Not on file  Transportation Needs:   . Lack of Transportation (Medical): Not on file  . Lack of Transportation (Non-Medical): Not on file  Physical Activity:   . Days of Exercise per Week: Not on file  . Minutes of Exercise per Session: Not on file  Stress:   . Feeling of Stress : Not on file  Social Connections:   . Frequency of Communication with Friends and Family: Not on file  . Frequency of Social Gatherings with Friends and Family: Not on file  . Attends Religious Services: Not on file  . Active Member of Clubs or Organizations: Not on file  . Attends Archivist Meetings: Not on file  . Marital Status: Not on file   Additional Social History:    Allergies:   Allergies  Allergen Reactions  . Lorazepam Other (See Comments)     "made me anxious and mean"   . Iron Nausea Only    Labs:  Results for orders placed or performed during the hospital encounter of 05/01/2020 (from the past 48 hour(s))  CBC     Status: Abnormal   Collection Time: 06/02/20  5:21 AM  Result Value Ref Range   WBC 5.8 4.0 - 10.5 K/uL   RBC 3.45 (L) 4.22 - 5.81 MIL/uL   Hemoglobin 7.6 (L) 13.0 - 17.0 g/dL    Comment: Reticulocyte Hemoglobin testing may be clinically indicated, consider ordering this additional test [id number]    HCT 26.8 (L) 39 - 52 %   MCV 77.7 (L) 80.0 - 100.0 fL   MCH 22.0 (L) 26.0 - 34.0 pg   MCHC 28.4 (L) 30.0 - 36.0 g/dL   RDW 23.8 (H) 11.5 - 15.5 %   Platelets 44 (L) 150 - 400 K/uL    Comment: Immature Platelet Fraction may be clinically  indicated, consider ordering this additional test [id number]    nRBC 0.5 (H) 0.0 - 0.2 %    Comment: Performed at [facility], [address] [place], [place] [address]  Basic metabolic panel     Status: Abnormal   Collection Time: 06/02/20  5:21 AM  Result Value Ref Range   Sodium 143 135 - 145 mmol/L   Potassium 4.6 3.5 - 5.1 mmol/L   Chloride 101 98 - 111 mmol/L   CO2 33 (H) 22 - 32 mmol/L   Glucose, Bld 147 (H) 70 - 99 mg/dL    Comment: Glucose reference range applies only to samples taken after fasting for at least 8 hours.   BUN 16 6 - 20 mg/dL   Creatinine, Ser 0.62 0.61 - 1.24 mg/dL   Calcium 8.1 (L) 8.9 - 10.3 mg/dL   GFR, Estimated >60 >60 mL/min    Comment: (NOTE) Calculated using the CKD-EPI Creatinine Equation (2021)    Anion gap 9 5 - 15    Comment: Performed at [facility], [address] [place], [place] [address]  Prepare Pheresed Platelets     Status: None   Collection Time: 06/02/20  7:32 AM  Result Value Ref Range   Unit Number [id number]    Blood Component Type PLTP1 PSORALEN TREATED    Unit division 00    Status of Unit ISSUED,FINAL    Transfusion Status OK TO TRANSFUSE   Prepare RBC (crossmatch)     Status:  None   Collection Time: 06/02/20  8:00 AM  Result Value Ref Range   Order Confirmation      ORDER PROCESSED BY BLOOD BANK Performed at [facility], [address] [place], [place] [address]   MRSA PCR Screening     Status: None   Collection Time: 06/02/20 10:25 AM   Specimen: Nasopharyngeal  Result Value Ref Range   MRSA by PCR NEGATIVE NEGATIVE    Comment:        The GeneXpert MRSA Assay (FDA approved for NASAL specimens only), is one component of a comprehensive MRSA colonization surveillance program. It is not intended to diagnose MRSA infection nor to guide or monitor treatment for MRSA infections. Performed at [facility], [address] [place], [place] [address]   Type and screen [place]     Status: None   Collection Time: 06/02/20 11:29 AM  Result Value Ref Range   ABO/RH(D) A POS    Antibody Screen NEG    Sample Expiration 06/05/2020,2359    Unit Number [id number]    Blood Component Type RED CELLS,LR    Unit division 00    Status of Unit ISSUED,FINAL    Transfusion Status OK TO TRANSFUSE    Crossmatch Result      Compatible Performed at [facility], [address] [place], [place] [address]   Hemoglobin and hematocrit, blood     Status: Abnormal   Collection Time: 06/02/20  8:00 PM  Result Value Ref Range   Hemoglobin 8.1 (L) 13.0 - 17.0 g/dL   HCT 29.5 (L) 39 - 52 %    Comment: Performed at [facility], [address] [place], [place] [address]  CBC     Status: Abnormal   Collection Time: 06/03/20  3:54 AM  Result Value Ref Range   WBC 4.9 4.0 - 10.5 K/uL   RBC 3.67 (L) 4.22 - 5.81 MIL/uL   Hemoglobin 7.9 (L) 13.0 - 17.0 g/dL  HCT 29.5 (L) 39 - 52 %   MCV 80.4 80.0 - 100.0 fL   MCH 21.5 (L) 26.0 - 34.0 pg   MCHC 26.8 (L) 30.0 - 36.0 g/dL   RDW 23.2 (H) 11.5 - 15.5 %   Platelets 45 (L) 150 - 400 K/uL    Comment: Immature Platelet Fraction may be clinically indicated,  consider ordering this additional test [id number]    nRBC 0.4 (H) 0.0 - 0.2 %    Comment: Performed at [facility], [address] [place], [place] [address]  Basic metabolic panel     Status: Abnormal   Collection Time: 06/03/20  3:54 AM  Result Value Ref Range   Sodium 147 (H) 135 - 145 mmol/L   Potassium 4.7 3.5 - 5.1 mmol/L   Chloride 104 98 - 111 mmol/L   CO2 34 (H) 22 - 32 mmol/L   Glucose, Bld 115 (H) 70 - 99 mg/dL    Comment: Glucose reference range applies only to samples taken after fasting for at least 8 hours.   BUN 27 (H) 6 - 20 mg/dL   Creatinine, Ser 0.63 0.61 - 1.24 mg/dL   Calcium 8.1 (L) 8.9 - 10.3 mg/dL   GFR, Estimated >60 >60 mL/min    Comment: (NOTE) Calculated using the CKD-EPI Creatinine Equation (2021)    Anion gap 9 5 - 15    Comment: Performed at [facility], [address] [place], [place] [address]  Magnesium     Status: None   Collection Time: 06/03/20  3:54 AM  Result Value Ref Range   Magnesium 2.0 1.7 - 2.4 mg/dL    Comment: Performed at [facility], [address], [place], [place] [address]  Phosphorus     Status: None   Collection Time: 06/03/20  3:54 AM  Result Value Ref Range   Phosphorus 3.9 2.5 - 4.6 mg/dL    Comment: Performed at [facility], [address] [place], [place] [address]  Hemoglobin and hematocrit, blood     Status: Abnormal   Collection Time: 06/03/20 12:15 PM  Result Value Ref Range   Hemoglobin 8.2 (L) 13.0 - 17.0 g/dL   HCT 29.9 (L) 39 - 52 %    Comment: Performed at [facility], [address] [place],  [address]    Current Facility-Administered Medications  Medication Dose Route Frequency Provider Last Rate Last Admin  . cefTRIAXone (ROCEPHIN) 1 g in sodium chloride 0.9 % 100 mL IVPB  1 g Intravenous Q24H [name], MD   Stopped at 06/03/20 0341  . Chlorhexidine Gluconate Cloth 2 % PADS 6 each  6 each Topical Daily Flora Lipps, MD   6 each at  06/03/20 0830  . dexmedetomidine (PRECEDEX) 400 MCG/100ML (4 mcg/mL) infusion  0.4-1.2 mcg/kg/hr Intravenous Titrated Loletha Grayer, MD 18.4 mL/hr at 06/03/20 1411 1.165 mcg/kg/hr at 06/03/20 1411  . fluticasone furoate-vilanterol (BREO ELLIPTA) 100-25 MCG/INH 1 puff  1 puff Inhalation Daily Wieting, Richard, MD      . folic acid (FOLVITE) tablet 1 mg  1 mg Oral Daily Rust-Chester, Britton L, NP   1 mg at 06/03/20 1112  . haloperidol lactate (HALDOL) injection 1 mg  1 mg Intravenous Q6H PRN Loletha Grayer, MD   1 mg at 06/03/20 1604  . influenza vac split quadrivalent PF (FLUARIX) injection 0.5 mL  0.5 mL Intramuscular Tomorrow-1000 Wieting, Richard, MD      . ipratropium-albuterol (DUONEB) 0.5-2.5 (3) MG/3ML nebulizer solution 3 mL  3 mL Nebulization Q6H PRN Rust-Chester, Britton L, NP   3 mL at 06/03/20 0612  . morphine 2 MG/ML injection 2 mg  2 mg Intravenous Q2H PRN [name], MD   2 mg at 06/03/20 1604  . octreotide (SANDOSTATIN) 500 mcg in sodium chloride 0.9 % 250 mL (2 mcg/mL) infusion  50 mcg/hr Intravenous Continuous [name], MD 25 mL/hr at 06/03/20 1200 50 mcg/hr at 06/03/20 1200  . ondansetron (ZOFRAN) tablet 4 mg  4 mg Oral Q6H PRN [name], MD       Or  . ondansetron (ZOFRAN) injection 4 mg  4 mg Intravenous Q6H PRN [name], MD   4 mg at 06/02/20 1435  . pantoprazole (PROTONIX) injection 40 mg  40 mg Intravenous Q12H Rust-Chester, Britton L, NP   40 mg at 06/03/20 0918  . pneumococcal 23 valent vaccine (PNEUMOVAX-23) injection 0.5 mL  0.5 mL Intramuscular Tomorrow-1000 Wieting, Richard, MD      . QUEtiapine (SEROQUEL) tablet 25 mg  25 mg Oral QHS PRN Loletha Grayer, MD   25 mg at 06/07/2020 2355  . thiamine 500mg  in normal saline (79ml) IVPB  500 mg Intravenous TID Flora Lipps, MD   Stopped at 06/03/20 0909    Musculoskeletal: Strength & Muscle Tone: decreased Gait & Station: unable to stand Patient leans: N/A  Psychiatric Specialty Exam: Physical  Exam Vitals and nursing note reviewed.  HENT:     Head: Normocephalic and atraumatic.  Eyes:     Conjunctiva/sclera: Conjunctivae normal.  Cardiovascular:     Heart sounds: Normal heart sounds.  Pulmonary:     Effort: Pulmonary effort is normal.  Abdominal:     Palpations: Abdomen is soft.  Musculoskeletal:        General: Normal range of motion.     Cervical back: Normal range of motion.  Skin:    General: Skin is warm and dry.  Psychiatric:        Speech: He is noncommunicative.     Review of Systems  Unable to perform ROS: Patient unresponsive    Blood pressure 123/65, pulse (!) 47, temperature 98.1 F (36.7 C), temperature source Axillary, resp. rate 16, height 5\' 8"  (1.727 m), weight 63.2 kg, SpO2 100 %.Body mass index is 21.19 kg/m.  General Appearance: Negative  Eye Contact:  Negative  Speech:  Negative  Volume:  Decreased  Mood:  Negative  Affect:  Negative  Thought Process:  NA  Orientation:  Negative  Thought Content:  Negative  Suicidal Thoughts:  No  Homicidal Thoughts:  No  Memory:  Negative  Judgement:  Negative  Insight:  Negative  Psychomotor Activity:  Negative  Concentration:  Concentration: Negative  Recall:  Negative  Fund of Knowledge:  Negative  Language:  Negative  Akathisia:  Negative  Handed:  Right  AIMS (if indicated):     Assets:  Social Support  ADL's:  Impaired  Cognition:  Impaired,  Severe  Sleep:        Treatment Plan Summary: Plan Patient is still on Precedex sedation for delirium secondary to medical problems.  Apparently had not been drinking recently.  No indication for any psychiatric involvement.  I am going to sign off of this.  Reconsult if needed.  Disposition: No evidence of imminent risk to self or others at present.    Alethia Berthold, MD 06/03/2020 4:50 PM

## 2020-06-03 NOTE — Progress Notes (Signed)
PROGRESS NOTE    [name]   PXT:[id number]  DOB: [birth year]  PCP: [name], DO    DOA: 05/06/2020 LOS: 2   Brief Narrative   Manuel Jimenez is a 59 y.o. male with medical history significant for alcoholic cirrhosis with portal hypertension, history of bleeding esophageal varices as well as history of gastric ulcer who presented to the ED on 05/12/2020 with a several day history of worsening shortness of breath on exertion which he stated similar to prior episodes of acute anemia.  Also reported dark tarry stools, no abdominal pain.  In the ED, BP was 91/58, RR 26, O2 sat 78% on room air needing 2-4 L/min oxygen.  Labs notable for Hbg 6.4 (10.8 a year prior), platelets 102k. Chest xray showed left lower lobe atelectasis vs consolidation and right pleural effusion.    Patient started on IV Protonix and IV octreotide as well as transfusion of pRBC's.  Admitted to hospitalist service with GI consulted.    11/1 - EGD showed grade III esophageal varices, incompletely eradicated, banded.  Portal hypertensive gastropathy, non-bleeding gastric ulcer.  Overnight 11/1-2, patient with significant agitation and combativeness, pulled out IV.  Requiring Precedex drip in ICU.         Assessment & Plan   Principal Problem:   Delirium Active Problems:   Alcoholic cirrhosis of liver without ascites (HCC)   Symptomatic anemia   Portal hypertension (HCC)   Acute blood loss anemia   Melena   History of esophageal varices with bleeding   History of gastric ulcer   Hypoxia   Acute respiratory failure with hypoxia (HCC)   COPD exacerbation (HCC)   Acute metabolic encephalopathy /delirium with agitation -onset appears to have occurred after EGD evening of 11/1.  Unclear of exact etiology but could be metabolic with uremia secondary to his GI bleeding versus hepatic encephalopathy.  Patient does have a history of alcohol abuse but reportedly sober for 5 years, seems less  likely Wernicke encephalopathy but is on high-dose IV thiamine currently.  Previous ammonia level was normal, but patient not having BMs and unable to take p.o. meds due to sedation.  CT head on 11/2 was negative for acute intracranial findings.   GI suspects hepatic encephalopathy exacerbated by GI bleeding.  Initially treated with Haldol and Geodon, apparently with little effect. Per wife, Ativan exacerbates his agitation. --Continue Precedex drip, wean off as tolerated --Continue IV thiamine --Follow-up ammonia level (prior was normal on 11/1) --Consider NG tube for administration of lactulose if needed --AVOID BENZO's --Sitter for safety --Delirium precautions:     -Lights and TV off, minimize interruptions at night    -Blinds open and lights on during day    -Glasses/hearing aid with patient    -Frequent reorientation    -PT/OT when able    -Avoid sedation medications/Beers list medications  Alcoholic cirrhosis without ascites Portal hypertension with bleeding esophageal varices - s/p EGD on 11/1 with banding of grade 3 varices Portal hypertensive gastropathy -seen on EGD 11/9 Thrombocytopenia -transfused a unit of platelets on 11/2 --GI consulted --Currently n.p.o. given sedation and encephalopathy --On IV PPI and octreotide drip --On empiric Rocephin --Unable to give lactulose or Xifaxan at this time, will consider NG tube for these if needed --Follow-up pending ammonia level --SCD's for VTE ppx --hold propranolol (low BP's), resume once stable  Acute blood loss anemia secondary to esophageal variceal bleeding -patient presented with hemoglobin 6.4, transfused 2 units PRBCs on admission, third unit  transfused on 11/2.  Continue IV Protonix and octreotide.   --Monitor CBC closely --Transfuse if hemoglobin less than 7.0 or rapid active bleeding --Page on-call GI if recurrent bleeding  Acute respiratory failure with hypoxia -present on admission with pulse ox 78% on room air  and symptomatic with SOB/dyspnea. Possibly due to esophageal bleeding, encephalopathy, possible PNA on xray.  No fevers or leukocytosis to suggest infection however. --Continue supplemental O2 to maintain sats over 90%  COPD with acute exacerbation - present on admission. IV steroids discontinued by prior attending as wheezing had resolved.  Continue nebs and inhalers per orders.  Involuntary committed in ER - psych was consulted, evaluated patient 11/3, signed off.    DVT prophylaxis: SCDs Start: 06/18/2020 0300   Diet:  Diet Orders (From admission, onward)    Start     Ordered   06/18/2020 1447  Diet NPO time specified  Diet effective now        06/21/2020 1446            Code Status: Full Code    Subjective 06/03/20    Patient sedated on Precedex when seen in ICU.  Wife at bedside.  We discussed his current mental status, agitation, and overall plan of care.  No acute events per sitter at bedside.     Disposition Plan & Communication   Status is: Inpatient  Remains inpatient appropriate because:Inpatient level of care appropriate due to severity of illness, sedated on Precedex for severe agitation, remains in ICU.  Patient critically ill.   Dispo: The patient is from: Home              Anticipated d/c is to: Home              Anticipated d/c date is: 3 days              Patient currently is not medically stable to d/c.     Family Communication: wife at bedside on rounds    Consults, Procedures, Significant Events   Consultants:   PCCM  Gastoenterology  Procedures:   EGD 11/1 - esophageal varices banded  Antimicrobials:  Anti-infectives (From admission, onward)   Start     Dose/Rate Route Frequency Ordered Stop   06/02/20 0300  cefTRIAXone (ROCEPHIN) 1 g in sodium chloride 0.9 % 100 mL IVPB        1 g 200 mL/hr over 30 Minutes Intravenous Every 24 hours 06/14/2020 0302     06/25/2020 0200  cefTRIAXone (ROCEPHIN) 1 g in sodium chloride 0.9 % 100 mL IVPB         1 g 200 mL/hr over 30 Minutes Intravenous  Once 06/20/2020 0157 06/17/2020 0353         Objective   Vitals:   06/03/20 1400 06/03/20 1500 06/03/20 1600 06/03/20 1700  BP: (!) 69/51 119/65 (!) 128/45 129/63  Pulse: (!) 55 (!) 51 63 (!) 54  Resp: 19 16 19 13   Temp:      TempSrc:      SpO2: 98% 100% 100% 99%  Weight:      Height:        Intake/Output Summary (Last 24 hours) at 06/03/2020 1743 Last data filed at 06/03/2020 1200 Gross per 24 hour  Intake 1151.78 ml  Output 1350 ml  Net -198.22 ml   Filed Weights   05/05/2020 2103 06/17/2020 1024  Weight: 60.8 kg 63.2 kg    Physical Exam:  General exam: sedated, no acute distress, underweight Respiratory  system: CTAB, normal respiratory effort. Cardiovascular system: normal S1/S2, RRR, no pedal edema.   Gastrointestinal system: soft, NT, ND, hypoactive bowel sounds. Central nervous system: unable to evaluate due to patient sedated   Labs   Data Reviewed: I have personally reviewed following labs and imaging studies  CBC: Recent Labs  Lab 05/28/2020 2111 05/10/2020 2111 06/28/2020 0521 06/02/2020 0521 06/02/20 0521 06/02/20 2000 06/03/20 0354 06/03/20 1215 06/03/20 1711  WBC 8.8  --  8.7  --  5.8  --  4.9  --   --   NEUTROABS 6.5  --   --   --   --   --   --   --   --   HGB 6.4*   < > 8.0*   < > 7.6* 8.1* 7.9* 8.2* 8.2*  HCT 24.4*   < > 28.9*   < > 26.8* 29.5* 29.5* 29.9* 29.5*  MCV 72.0*  --  75.9*  --  77.7*  --  80.4  --   --   PLT 102*  --  68*  --  44*  --  45*  --   --    < > = values in this interval not displayed.   Basic Metabolic Panel: Recent Labs  Lab 05/30/2020 2111 06/02/20 0521 06/03/20 0354  NA 142 143 147*  K 4.8 4.6 4.7  CL 106 101 104  CO2 25 33* 34*  GLUCOSE 132* 147* 115*  BUN 19 16 27*  CREATININE 0.56* 0.62 0.63  CALCIUM 8.7* 8.1* 8.1*  MG  --   --  2.0  PHOS  --   --  3.9   GFR: Estimated Creatinine Clearance: 87.8 mL/min (by C-G formula based on SCr of 0.63 mg/dL). Liver Function  Tests: Recent Labs  Lab 05/19/2020 2111  AST 17  ALT 12  ALKPHOS 67  BILITOT 0.4  PROT 5.8*  ALBUMIN 3.3*   No results for input(s): LIPASE, AMYLASE in the last 168 hours. Recent Labs  Lab 06/09/2020 0605  AMMONIA 28   Coagulation Profile: No results for input(s): INR, PROTIME in the last 168 hours. Cardiac Enzymes: No results for input(s): CKTOTAL, CKMB, CKMBINDEX, TROPONINI in the last 168 hours. BNP (last 3 results) No results for input(s): PROBNP in the last 8760 hours. HbA1C: No results for input(s): HGBA1C in the last 72 hours. CBG: No results for input(s): GLUCAP in the last 168 hours. Lipid Profile: No results for input(s): CHOL, HDL, LDLCALC, TRIG, CHOLHDL, LDLDIRECT in the last 72 hours. Thyroid Function Tests: No results for input(s): TSH, T4TOTAL, FREET4, T3FREE, THYROIDAB in the last 72 hours. Anemia Panel: No results for input(s): VITAMINB12, FOLATE, FERRITIN, TIBC, IRON, RETICCTPCT in the last 72 hours. Sepsis Labs: No results for input(s): PROCALCITON, LATICACIDVEN in the last 168 hours.  Recent Results (from the past 240 hour(s))  Respiratory Panel by RT PCR (Flu A&B, Covid) - Nasopharyngeal Swab     Status: None   Collection Time: 06/17/2020  3:07 AM   Specimen: Nasopharyngeal Swab  Result Value Ref Range Status   SARS Coronavirus 2 by RT PCR NEGATIVE NEGATIVE Final    Comment: (NOTE) SARS-CoV-2 target nucleic acids are NOT DETECTED.  The SARS-CoV-2 RNA is generally detectable in upper respiratoy specimens during the acute phase of infection. The lowest concentration of SARS-CoV-2 viral copies this assay can detect is 131 copies/mL. A negative result does not preclude SARS-Cov-2 infection and should not be used as the sole basis for treatment or other patient management  decisions. A negative result may occur with  improper specimen collection/handling, submission of specimen other than nasopharyngeal swab, presence of viral mutation(s) within the areas  targeted by this assay, and inadequate number of viral copies (<131 copies/mL). A negative result must be combined with clinical observations, patient history, and epidemiological information. The expected result is Negative.  Fact Sheet for Patients:  PinkCheek.be  Fact Sheet for Healthcare Providers:  GravelBags.it  This test is no t yet approved or cleared by the [place] FDA and  has been authorized for detection and/or diagnosis of SARS-CoV-2 by FDA under an Emergency Use Authorization (EUA). This EUA will remain  in effect (meaning this test can be used) for the duration of the COVID-19 declaration under Section 564(b)(1) of the Act, 21 U.S.C. section 360bbb-3(b)(1), unless the authorization is terminated or revoked sooner.     Influenza A by PCR NEGATIVE NEGATIVE Final   Influenza B by PCR NEGATIVE NEGATIVE Final    Comment: (NOTE) The Xpert Xpress SARS-CoV-2/FLU/RSV assay is intended as an aid in  the diagnosis of influenza from Nasopharyngeal swab specimens and  should not be used as a sole basis for treatment. Nasal washings and  aspirates are unacceptable for Xpert Xpress SARS-CoV-2/FLU/RSV  testing.  Fact Sheet for Patients: PinkCheek.be  Fact Sheet for Healthcare Providers: GravelBags.it  This test is not yet approved or cleared by the [place] FDA and  has been authorized for detection and/or diagnosis of SARS-CoV-2 by  FDA under an Emergency Use Authorization (EUA). This EUA will remain  in effect (meaning this test can be used) for the duration of the  Covid-19 declaration under Section 564(b)(1) of the Act, 21  U.S.C. section 360bbb-3(b)(1), unless the authorization is  terminated or revoked. Performed at [facility], [address] [place], [place] [address]   MRSA PCR Screening     Status: None   Collection Time:  06/02/20 10:25 AM   Specimen: Nasopharyngeal  Result Value Ref Range Status   MRSA by PCR NEGATIVE NEGATIVE Final    Comment:        The GeneXpert MRSA Assay (FDA approved for NASAL specimens only), is one component of a comprehensive MRSA colonization surveillance program. It is not intended to diagnose MRSA infection nor to guide or monitor treatment for MRSA infections. Performed at [facility], [address] [place], [place] [address]       Imaging Studies   CT HEAD WO CONTRAST  Result Date: 06/02/2020 CLINICAL DATA:  Mental status changes of unknown cause. Thrombocytopenia. EXAM: CT HEAD WITHOUT CONTRAST TECHNIQUE: Contiguous axial images were obtained from the base of the skull through the vertex without intravenous contrast. COMPARISON:  05/08/2015 FINDINGS: Brain: Normal appearance without evidence of old or acute infarction, mass lesion, hemorrhage, hydrocephalus or extra-axial collection. Vascular: There is atherosclerotic calcification of the major vessels at the base of the brain. Skull: No skull fracture. Sinuses/Orbits: Minimal mucosal thickening of the maxillary sinuses. Orbits negative. Other: Nonspecific occipital region scalp soft tissue swelling. IMPRESSION: 1. No acute intracranial finding. Normal appearance of the brain itself. 2. Nonspecific occipital region scalp soft tissue swelling. Electronically Signed   By: Nelson Chimes M.D.   On: 06/02/2020 10:23   DG Chest Port 1 View  Result Date: 06/03/2020 CLINICAL DATA:  Shortness of breath, history cirrhosis, hypertension, COPD EXAM: PORTABLE CHEST 1 VIEW COMPARISON:  Portable exam 0806 hours compared to 05/16/2020 FINDINGS: Normal heart size, mediastinal contours, and pulmonary vascularity. Atherosclerotic calcification aorta.  Layered pleural effusion throughout RIGHT chest. Atelectasis versus infiltrate LEFT lower lobe. No pneumothorax or acute osseous findings. IMPRESSION: LEFT lower lobe atelectasis  versus consolidation. RIGHT pleural effusion. Electronically Signed   By: Lavonia Dana M.D.   On: 06/03/2020 08:50     Medications   Scheduled Meds: . Chlorhexidine Gluconate Cloth  6 each Topical Daily  . fluticasone furoate-vilanterol  1 puff Inhalation Daily  . folic acid  1 mg Oral Daily  . influenza vac split quadrivalent PF  0.5 mL Intramuscular Tomorrow-1000  . pantoprazole (PROTONIX) IV  40 mg Intravenous Q12H  . pneumococcal 23 valent vaccine  0.5 mL Intramuscular Tomorrow-1000   Continuous Infusions: . cefTRIAXone (ROCEPHIN)  IV Stopped (06/03/20 0341)  . dexmedetomidine (PRECEDEX) IV infusion 1.165 mcg/kg/hr (06/03/20 1411)  . octreotide  (SANDOSTATIN)    IV infusion 50 mcg/hr (06/03/20 1200)  . thiamine injection 500 mg (06/03/20 1650)       LOS: 2 days    Time spent: 30 minutes with > 50% spent in coordination of care and direct patient contact.    Ezekiel Slocumb, DO [facility]  06/03/2020, 5:43 PM    If 7PM-7AM, please contact night-coverage. How to contact the [facility] Attending or Consulting provider [place] or covering provider during after hours [place], for this patient?    1. Check the care team in [facility] and look for a) attending/consulting [facility] provider listed and b) the [facility] team listed 2. Log into [email or web address] and use [place]'s universal password to access. If you do not have the password, please contact the hospital operator. 3. Locate the [facility] provider you are looking for under [facility] and page to a number that you can be directly reached. 4. If you still have difficulty reaching the provider, please page the [facility] (Director on Call) for the Hospitalists listed on amion for assistance.

## 2020-06-03 NOTE — Progress Notes (Signed)
Spoke with pts wife Manuel Jimenez who is currently at pts bedside regarding pts code status and goals of treatment.  Following multiple discussions with ICU Intensivist Dr. Mortimer Fries throughout the day regarding pts poor quality of life and continued decline in health [name] has requested to change pts code status to DNR/DNI and would like the pt discharged home with hospice services.  Therefore, pts code status changed per family request to DNR/DNI and palliative care consulted to assist with contacting hospice team.  Case discussed with Dr. Mortimer Fries regarding change in plan of care and code status.  All questions were answered will continue to monitor and assess pt.  Manuel Jimenez, [place] Pager [phone] (please enter 7 digits) PCCM Consult Pager [phone] (please enter 7 digits)

## 2020-06-04 DIAGNOSIS — R410 Disorientation, unspecified: Secondary | ICD-10-CM | POA: Diagnosis not present

## 2020-06-04 DIAGNOSIS — K766 Portal hypertension: Secondary | ICD-10-CM | POA: Diagnosis not present

## 2020-06-04 DIAGNOSIS — K7030 Alcoholic cirrhosis of liver without ascites: Secondary | ICD-10-CM | POA: Diagnosis not present

## 2020-06-04 DIAGNOSIS — J9601 Acute respiratory failure with hypoxia: Secondary | ICD-10-CM | POA: Diagnosis not present

## 2020-06-04 LAB — CBC
HCT: 30.1 % — ABNORMAL LOW (ref 39.0–52.0)
Hemoglobin: 8.2 g/dL — ABNORMAL LOW (ref 13.0–17.0)
MCH: 22 pg — ABNORMAL LOW (ref 26.0–34.0)
MCHC: 27.2 g/dL — ABNORMAL LOW (ref 30.0–36.0)
MCV: 80.7 fL (ref 80.0–100.0)
Platelets: 32 10*3/uL — ABNORMAL LOW (ref 150–400)
RBC: 3.73 MIL/uL — ABNORMAL LOW (ref 4.22–5.81)
RDW: 23.4 % — ABNORMAL HIGH (ref 11.5–15.5)
WBC: 5.8 10*3/uL (ref 4.0–10.5)
nRBC: 0.3 % — ABNORMAL HIGH (ref 0.0–0.2)

## 2020-06-04 LAB — COMPREHENSIVE METABOLIC PANEL
ALT: 16 U/L (ref 0–44)
ALT: 17 U/L (ref 0–44)
AST: 21 U/L (ref 15–41)
AST: 22 U/L (ref 15–41)
Albumin: 2.9 g/dL — ABNORMAL LOW (ref 3.5–5.0)
Albumin: 3 g/dL — ABNORMAL LOW (ref 3.5–5.0)
Alkaline Phosphatase: 49 U/L (ref 38–126)
Alkaline Phosphatase: 53 U/L (ref 38–126)
Anion gap: 9 (ref 5–15)
Anion gap: 8 (ref 5–15)
BUN: 25 mg/dL — ABNORMAL HIGH (ref 6–20)
BUN: 24 mg/dL — ABNORMAL HIGH (ref 6–20)
CO2: 34 mmol/L — ABNORMAL HIGH (ref 22–32)
CO2: 37 mmol/L — ABNORMAL HIGH (ref 22–32)
Calcium: 8.2 mg/dL — ABNORMAL LOW (ref 8.9–10.3)
Calcium: 8.4 mg/dL — ABNORMAL LOW (ref 8.9–10.3)
Chloride: 108 mmol/L (ref 98–111)
Chloride: 108 mmol/L (ref 98–111)
Creatinine, Ser: 0.51 mg/dL — ABNORMAL LOW (ref 0.61–1.24)
Creatinine, Ser: 0.49 mg/dL — ABNORMAL LOW (ref 0.61–1.24)
GFR, Estimated: >60 mL/min (ref >60)
GFR, Estimated: >60 mL/min (ref >60)
Glucose, Bld: 103 mg/dL — ABNORMAL HIGH (ref 70–99)
Glucose, Bld: 111 mg/dL — ABNORMAL HIGH (ref 70–99)
Potassium: 4.2 mmol/L (ref 3.5–5.1)
Potassium: 4.2 mmol/L (ref 3.5–5.1)
Sodium: 151 mmol/L — ABNORMAL HIGH (ref 135–145)
Sodium: 153 mmol/L — ABNORMAL HIGH (ref 135–145)
Total Bilirubin: 0.9 mg/dL (ref 0.3–1.2)
Total Bilirubin: 1.3 mg/dL — ABNORMAL HIGH (ref 0.3–1.2)
Total Protein: 4.9 g/dL — ABNORMAL LOW (ref 6.5–8.1)
Total Protein: 4.8 g/dL — ABNORMAL LOW (ref 6.5–8.1)

## 2020-06-04 LAB — GLUCOSE, CAPILLARY
Glucose-Capillary: 102 mg/dL — ABNORMAL HIGH (ref 70–99)
Glucose-Capillary: 104 mg/dL — ABNORMAL HIGH (ref 70–99)
Glucose-Capillary: 108 mg/dL — ABNORMAL HIGH (ref 70–99)
Glucose-Capillary: 139 mg/dL — ABNORMAL HIGH (ref 70–99)
Glucose-Capillary: 99 mg/dL (ref 70–99)

## 2020-06-04 LAB — HEMOGLOBIN AND HEMATOCRIT, BLOOD
HCT: 29.4 % — ABNORMAL LOW (ref 39.0–52.0)
Hemoglobin: 8 g/dL — ABNORMAL LOW (ref 13.0–17.0)

## 2020-06-04 LAB — MAGNESIUM: Magnesium: 2 mg/dL (ref 1.7–2.4)

## 2020-06-04 LAB — PHOSPHORUS: Phosphorus: 3.5 mg/dL (ref 2.5–4.6)

## 2020-06-04 MED ORDER — MIDAZOLAM HCL 2 MG/2ML IJ SOLN
2.0000 mg | Freq: Once | INTRAMUSCULAR | Status: AC
  Administered 2020-06-04: 2 mg via INTRAVENOUS

## 2020-06-04 MED ORDER — MIDAZOLAM HCL 2 MG/2ML IJ SOLN
1.0000 mg | Freq: Four times a day (QID) | INTRAMUSCULAR | Status: DC | PRN
  Administered 2020-06-04 – 2020-06-05 (×6): 2 mg via INTRAVENOUS
  Filled 2020-06-04 (×5): qty 2

## 2020-06-04 MED ORDER — MIDAZOLAM HCL 2 MG/2ML IJ SOLN
INTRAMUSCULAR | Status: AC
  Filled 2020-06-04: qty 2

## 2020-06-04 MED ORDER — DEXTROSE-NACL 5-0.45 % IV SOLN: INTRAVENOUS | Status: DC

## 2020-06-04 NOTE — Plan of Care (Signed)
Next of kin not at bedside. Spoke with wife via phone, she states she is currently at home. She states she has been updated by the care teams and feels her husband would be more comfortable at home. She would like to continue current treatment until in the morning when we can discuss goals and plans moving forward. She would like to meet at 9:00 am at bedside.

## 2020-06-04 NOTE — Consult Note (Addendum)
Consultation Note Date: 06/04/2020   Patient Name: Manuel Jimenez  DOB: November 20, 1960  MRN: [id number]  Age / Sex: 59 y.o., male  PCP: [name], DO Referring Physician: Ezekiel Slocumb, DO  Reason for Consultation: Establishing goals of care  HPI/Patient Profile: Manuel Jimenez is a 59 y.o. male with medical history significant for Alcoholic cirrhosis with portal hypertension with history of bleeding esophageal varices as well as history of gastric ulcer who presents to the emergency room with a several day history of shortness of breath on exertion that has been becoming progressively worse, similar to prior episodes of symptomatic anemia.  He admits to dark tarry stools in the past couple days.   Clinical Assessment and Goals of Care: Spoke with wife and sister at bedside. Patient is restless in bed with wrist restraints, and moaning. He does not speak. He is agitated and on precidex drip. Per wife, patient 3 weeks ago was fully independent and working. She states 2 weeks ago he was more tired and lethargic but remained fully independent.   We discussed his diagnoses, prognosis, GOC, EOL wishes disposition and options.  A detailed discussion was had today regarding advanced directives.  Concepts specific to code status, artifical feeding and hydration, IV antibiotics and rehospitalization were discussed.  The difference between an aggressive medical intervention path and a comfort care path was discussed.  Values and goals of care important to patient and family were attempted to be elicited.  Discussed limitations of medical interventions to prolong quality of life in some situations and discussed the concept of human mortality.  Wife and sister states they have been kept updated and understand his daignoses, an particularly that  "his liver is shot", and are aware of his esophageal varices  and how this has impacted his body and lab work.    Wife states initially she wants to take him home as she feels he would improve cognitively at home because he did 5 years ago when he was in a similar state. Upon further discussion, and discussion of symptom management needs, she states she would not be able to care for him and would rather he go to hospice facility. She states she wants to focus on full comfort for what time he has left. Attending team in to speak with wife.   Orders changed to assist with agitation.    The wife and sister verbalizes understanding and appreciation of detailed discussion.   Educated on natural trajectory of illness, and expectations at EOL were discussed.  Questions and concerns addressed.  Remained with patient to determine symptom management needs given agitation. Spoke with Dr. Hilma Favors; recommendations for initiating Morphine and Versed infusion if needed.     I completed a MOST form today and the signed original was placed in the chart. A photocopy was placed in the chart to be scanned into EMR. The patient outlined their wishes for the following treatment decisions:  Cardiopulmonary Resuscitation: Do Not Attempt Resuscitation (DNR/No CPR)  Medical Interventions: Comfort Measures: Keep  clean, warm, and dry. Use medication by any route, positioning, wound care, and other measures to relieve pain and suffering. Use oxygen, suction and manual treatment of airway obstruction as needed for comfort. Do not transfer to the hospital unless comfort needs cannot be met in current location.  Antibiotics: No antibiotics (use other measures to relieve symptoms)  IV Fluids: No IV fluids (provide other measures to ensure comfort)  Feeding Tube: No feeding tube        ADDENDUM: Called by nursing, patient remains agitated, but has improved. Patient continues to moan. He has eye brow furrowing. Per nursing, Morphine is helping with symptom management needs. Patient does  appear more comfortable after Morphine bolus doses. Morphine bolus PRN with infusion ordered. Discussed with wife that patient does not appear to be comfortable with O2, and is attempting to remove it.  Wife made aware that with titration down of his O2 from 15 lpm, his time could be limited. Wife at bedside to be with her husband.   ADDENDUM: Morphine bolus PRN with infusion ordered for patient's comfort. May initiate Versed infusion if needed for agitation. Spoke with [facility], they would not be able to accept patient with Versed infusion. Primary MD and Palliative MD aware.       SUMMARY OF RECOMMENDATIONS   D/C to hospice given symptom management needs.  Full comfort care.   Prognosis:  < 2 weeks  End stage liver disease. No PO intake.       Primary Diagnoses: Present on Admission: . Alcoholic cirrhosis of liver without ascites ([place]) . Portal hypertension ([place])   I have reviewed the medical record, interviewed the patient and family, and examined the patient. The following aspects are pertinent.  Past Medical History:  Diagnosis Date  . Anemia   . Cancer ([place])   . Cirrhosis ([place])   . Colon polyps   . Constipation   . Diarrhea   . Hemorrhoids   . Hypertension    in past  . Portal vein thrombosis    see chart review 06/05/15  . Shingles 09/2016  . Stomach ulcer   . Weight loss    Social History   Socioeconomic History  . Marital status: Married    Spouse name: Not on file  . Number of children: Not on file  . Years of education: [employer]  . Highest education level: Not on file  Occupational History  . Occupation: Retired  Tobacco Use  . Smoking status: Current Every Day Smoker    Packs/day: 2.00    Years: 40.00    Pack years: 80.00    Types: Cigarettes  . Smokeless tobacco: Never Used  . Tobacco comment: Only successfully quit for 1 month before due to acute illness. (01/24/17 - down to 1.5 PPD)  Substance and Sexual Activity   . Alcohol use: No    Comment: Former alcohol abuse- sober since 2016  . Drug use: No  . Sexual activity: Not on file  Other Topics Concern  . Not on file  Social History Narrative  . Not on file   Social Determinants of Health   Financial Resource Strain:   . Difficulty of Paying Living Expenses: Not on file  Food Insecurity:   . Worried About Charity fundraiser in the Last Year: Not on file  . Ran Out of Food in the Last Year: Not on file  Transportation Needs:   . Lack of Transportation (Medical): Not on file  .  Lack of Transportation (Non-Medical): Not on file  Physical Activity:   . Days of Exercise per Week: Not on file  . Minutes of Exercise per Session: Not on file  Stress:   . Feeling of Stress : Not on file  Social Connections:   . Frequency of Communication with Friends and Family: Not on file  . Frequency of Social Gatherings with Friends and Family: Not on file  . Attends Religious Services: Not on file  . Active Member of Clubs or Organizations: Not on file  . Attends Archivist Meetings: Not on file  . Marital Status: Not on file   Family History  Problem Relation Age of Onset  . CAD Father   . Heart attack Father    Scheduled Meds: . Chlorhexidine Gluconate Cloth  6 each Topical Daily  . fluticasone furoate-vilanterol  1 puff Inhalation Daily  . folic acid  1 mg Oral Daily  . influenza vac split quadrivalent PF  0.5 mL Intramuscular Tomorrow-1000  . mouth rinse  15 mL Mouth Rinse BID  . pantoprazole (PROTONIX) IV  40 mg Intravenous Q12H  . pneumococcal 23 valent vaccine  0.5 mL Intramuscular Tomorrow-1000   Continuous Infusions: . cefTRIAXone (ROCEPHIN)  IV Stopped (06/04/20 0242)  . dexmedetomidine (PRECEDEX) IV infusion 1.2 mcg/kg/hr (06/04/20 1500)  . dextrose 5 % and 0.45% NaCl    . octreotide  (SANDOSTATIN)    IV infusion 50 mcg/hr (06/04/20 1500)  . thiamine injection Stopped (06/04/20 1024)   PRN Meds:.haloperidol lactate,  ipratropium-albuterol, midazolam, morphine injection, ondansetron **OR** ondansetron (ZOFRAN) IV, QUEtiapine Medications Prior to Admission:  Prior to Admission medications   Medication Sig Start Date End Date Taking? Authorizing Provider  FERREX 150 150 MG capsule TAKE 1 CAPSULE BY MOUTH EVERY DAY 04/23/18  Yes Verlon Au, NP  omeprazole (PRILOSEC) 40 MG capsule Take 1 capsule (40 mg total) by mouth daily before breakfast. 05/13/19 06/20/2020 Yes Vanga, Tally Due, MD  pantoprazole (PROTONIX) 40 MG tablet Take 1 tablet (40 mg total) by mouth 2 (two) times daily before a meal. 04/07/19 06/05/2020 Yes [name], MD  propranolol (INDERAL) 20 MG tablet TAKE 1 TABLET BY MOUTH TWICE A DAY 11/01/19  Yes Vanga, Tally Due, MD  propranolol (INDERAL) 20 MG tablet Take 1 tablet (20 mg total) by mouth 2 (two) times daily. 05/13/19   Lin Landsman, MD   Allergies  Allergen Reactions  . Lorazepam Other (See Comments)    "made me anxious and mean"   . Iron Nausea Only   Review of Systems  Unable to perform ROS   Physical Exam Pulmonary:     Effort: Pulmonary effort is normal.  Neurological:     Mental Status: He is alert.     Vital Signs: BP (!) 109/53   Pulse (!) 43   Temp 97.6 F (36.4 C) (Axillary)   Resp (!) 9   Ht 5' 8"  (1.727 m)   Wt 63.2 kg   SpO2 97%   BMI 21.19 kg/m  Pain Scale: CPOT   Pain Score: 0-No pain   SpO2: SpO2: 97 % O2 Device:SpO2: 97 % O2 Flow Rate: .O2 Flow Rate (L/min): 10 L/min  IO: Intake/output summary:   Intake/Output Summary (Last 24 hours) at 06/04/2020 1656 Last data filed at 06/04/2020 1500 Gross per 24 hour  Intake 1562.03 ml  Output 760 ml  Net 802.03 ml    LBM: Last BM Date:  (pta) Baseline Weight: Weight: 60.8 kg Most recent weight:  Weight: 63.2 kg     Palliative Assessment/Data:     Time In: 9:00 Time Out: 11:00 Time Total: 120 min Greater than 50%  of this time was spent counseling and coordinating care related  to the above assessment and plan.  Signed by: Asencion Gowda, NP   Please contact Palliative Medicine Team phone at [phone] for questions and concerns.  For individual provider: See Shea Evans

## 2020-06-04 NOTE — Progress Notes (Addendum)
PROGRESS NOTE    [name]   EGB:[id number]  DOB: [birth year]  PCP: [name], DO    DOA: 05/25/2020 LOS: 3   Brief Narrative   MONTA POLICE is a 59 y.o. male with medical history significant for alcoholic cirrhosis with portal hypertension, history of bleeding esophageal varices as well as history of gastric ulcer who presented to the ED on 05/17/2020 with a several day history of worsening shortness of breath on exertion which he stated similar to prior episodes of acute anemia.  Also reported dark tarry stools, no abdominal pain.  In the ED, BP was 91/58, RR 26, O2 sat 78% on room air needing 2-4 L/min oxygen.  Labs notable for Hbg 6.4 (10.8 a year prior), platelets 102k. Chest xray showed left lower lobe atelectasis vs consolidation and right pleural effusion.    Patient started on IV Protonix and IV octreotide as well as transfusion of pRBC's.  Admitted to hospitalist service with GI consulted.    11/1 - EGD showed grade III esophageal varices, incompletely eradicated, banded.  Portal hypertensive gastropathy, non-bleeding gastric ulcer.  Overnight 11/1-2, patient with significant agitation and combativeness, pulled out IV.  Requiring Precedex drip in ICU.         Assessment & Plan   Principal Problem:   Delirium Active Problems:   Alcoholic cirrhosis of liver without ascites (HCC)   Symptomatic anemia   Portal hypertension (HCC)   Acute blood loss anemia   Melena   History of esophageal varices with bleeding   History of gastric ulcer   Hypoxia   Acute respiratory failure with hypoxia (HCC)   COPD exacerbation (HCC)   Acute metabolic encephalopathy /delirium with agitation -onset appears to have occurred after EGD evening of 11/1.  Unclear of exact etiology but could be metabolic with uremia secondary to his GI bleeding versus hepatic encephalopathy.  Patient does have a history of alcohol abuse but reportedly sober for 5 years, seems less  likely Wernicke encephalopathy but is on high-dose IV thiamine currently.  Previous ammonia level was normal, but patient not having BMs and unable to take p.o. meds due to sedation.  CT head on 11/2 was negative for acute intracranial findings.   GI suspects hepatic encephalopathy exacerbated by GI bleeding.   Initially treated with Haldol and Geodon, apparently with little effect. Per wife, Ativan exacerbates his agitation. 11/3 - Ammonia level normal. 11/4 - no improvement, pt quickly agitated when sedation lightened per RN and sitter.   Presentation is highly suspicious for alcohol or withdrawal from another substance.  If this is the case, expect to see improvement over next few days.  --Continue Precedex drip, wean off as tolerated --PRN Versed, Haldol, morphine --Continue IV thiamine --Follow-up ammonia level (prior was normal on 11/1) --Consider NG tube for administration of lactulose if needed --AVOID BENZO's --Sitter for safety --Delirium precautions:     -Lights and TV off, minimize interruptions at night    -Blinds open and lights on during day    -Glasses/hearing aid with patient    -Frequent reorientation    -PT/OT when able    -Avoid sedation medications/Beers list medications  Alcoholic cirrhosis without ascites Portal hypertension with bleeding esophageal varices - s/p EGD on 11/1 with banding of grade 3 varices Portal hypertensive gastropathy -seen on EGD 11/9 Thrombocytopenia -transfused a unit of platelets on 11/2 --GI consulted --Currently n.p.o. given sedation and encephalopathy --On IV PPI and octreotide drip --On empiric Rocephin, can stop --Unable  to give lactulose or Xifaxan at this time, will consider NG tube for these if needed --Follow-up pending ammonia level --SCD's for VTE ppx --hold propranolol (low BP's), resume once stable --Palliative care consulted for goals of care --Pt made DNR/DNI by PCCM team after discussion with wife at bedside later on  11/3.  Hypernatremia - due to NPO status.  Sodium 151 on AM labs today.  Will start D5-1/2 NS @ 100 cc/hr.  Monitor BMP.    Acute blood loss anemia secondary to esophageal variceal bleeding -patient presented with hemoglobin 6.4, transfused 2 units PRBCs on admission, third unit transfused on 11/2.  Continue IV Protonix and octreotide.   --Monitor CBC closely --Transfuse if hemoglobin less than 7.0 or rapid active bleeding --Page on-call GI if recurrent bleeding  Acute respiratory failure with hypoxia -present on admission with pulse ox 78% on room air and symptomatic with SOB/dyspnea. Possibly due to esophageal bleeding, encephalopathy, possible PNA on xray.  No fevers or leukocytosis to suggest infection however. --Continue supplemental O2 to maintain sats over 90%  COPD with acute exacerbation - present on admission. Off IV steroids since wheezing resolved.   --Continue nebs and inhalers per orders.  Involuntary committed in ER - psych was consulted, evaluated patient 11/3, signed off.    DVT prophylaxis: SCDs Start: 06/08/2020 0300   Diet:  Diet Orders (From admission, onward)    Start     Ordered   06/07/2020 1447  Diet NPO time specified  Diet effective now        06/23/2020 1446            Code Status: DNR    Subjective 06/04/20    Patient sedated on Precedex when seen in ICU.  Wife at bedside, and is tearful about his condition, and awaiting palliative care consult.  She would like to have restraints removed and sedation reduced to see if patient's mental status has improved and if he will talk to her.  Spoke with his bedside nurse, who indicated patient's agitation was quite severe, and patient has been getting intermittent Versed, haldol, morphine in addition to Precedex in order to control patient's agitation.  Per report, when patient begins to wake up, he becomes agitated and combative.    Patient does not respond or follow commands on exam.   Disposition Plan &  Communication   Status is: Inpatient  Remains inpatient appropriate because:Inpatient level of care appropriate due to severity of illness, sedated on Precedex for severe agitation, remains in ICU.  Patient critically ill.  Palliative care consult pending for goals of care.   Dispo: The patient is from: Home              Anticipated d/c is to: Home vs hospice              Anticipated d/c date is: >3 days              Patient currently is not medically stable to d/c.     Family Communication: wife at bedside on rounds    Consults, Procedures, Significant Events   Consultants:   PCCM  Gastoenterology  Procedures:   EGD 11/1 - esophageal varices banded  Antimicrobials:  Anti-infectives (From admission, onward)   Start     Dose/Rate Route Frequency Ordered Stop   06/03/20 2200  metroNIDAZOLE (FLAGYL) IVPB 500 mg        500 mg 100 mL/hr over 60 Minutes Intravenous Every 12 hours 06/03/20 2111  06/02/20 0300  cefTRIAXone (ROCEPHIN) 1 g in sodium chloride 0.9 % 100 mL IVPB        1 g 200 mL/hr over 30 Minutes Intravenous Every 24 hours 06/03/2020 0302     06/20/2020 0200  cefTRIAXone (ROCEPHIN) 1 g in sodium chloride 0.9 % 100 mL IVPB        1 g 200 mL/hr over 30 Minutes Intravenous  Once 06/25/2020 0157 06/16/2020 0353         Objective   Vitals:   06/04/20 0400 06/04/20 0700 06/04/20 0739 06/04/20 0800  BP: (!) 113/57 (!) 114/58  (!) 123/54  Pulse: (!) 50 (!) 44  (!) 43  Resp: (!) 9 10  (!) 9  Temp:    (!) 97.5 F (36.4 C)  TempSrc:    Axillary  SpO2: 97% 100% 98% (!) 82%  Weight:      Height:        Intake/Output Summary (Last 24 hours) at 06/04/2020 1443 Last data filed at 06/04/2020 1117 Gross per 24 hour  Intake 1111.39 ml  Output 760 ml  Net 351.39 ml   Filed Weights   05/03/2020 2103 06/28/2020 1024  Weight: 60.8 kg 63.2 kg    Physical Exam:  General exam: sedated and appears is dreaming, moving around intermittently, no acute distress,  underweight Respiratory system: CTAB, normal respiratory effort, on 15 L/min HFNC oxygen. Cardiovascular system: normal S1/S2, RRR, no pedal edema.   Gastrointestinal system: soft, NT, ND Central nervous system: unable to evaluate due to patient sedated   Labs   Data Reviewed: I have personally reviewed following labs and imaging studies  CBC: Recent Labs  Lab 05/28/2020 2111 05/19/2020 2111 06/14/2020 0521 06/23/2020 0521 06/02/20 0521 06/02/20 2000 06/03/20 0354 06/03/20 1215 06/03/20 1711 06/03/20 2335 06/04/20 0632  WBC 8.8  --  8.7  --  5.8  --  4.9  --   --   --  5.8  NEUTROABS 6.5  --   --   --   --   --   --   --   --   --   --   HGB 6.4*   < > 8.0*   < > 7.6*   < > 7.9* 8.2* 8.2* 8.0* 8.2*  HCT 24.4*   < > 28.9*   < > 26.8*   < > 29.5* 29.9* 29.5* 29.4* 30.1*  MCV 72.0*  --  75.9*  --  77.7*  --  80.4  --   --   --  80.7  PLT 102*  --  68*  --  44*  --  45*  --   --   --  32*   < > = values in this interval not displayed.   Basic Metabolic Panel: Recent Labs  Lab 05/19/2020 2111 06/02/20 0521 06/03/20 0354 06/04/20 0632  NA 142 143 147* 151*  K 4.8 4.6 4.7 4.2  CL 106 101 104 108  CO2 25 33* 34* 34*  GLUCOSE 132* 147* 115* 103*  BUN 19 16 27* 25*  CREATININE 0.56* 0.62 0.63 0.51*  CALCIUM 8.7* 8.1* 8.1* 8.2*  MG  --   --  2.0 2.0  PHOS  --   --  3.9 3.5   GFR: Estimated Creatinine Clearance: 87.8 mL/min (A) (by C-G formula based on SCr of 0.51 mg/dL (L)). Liver Function Tests: Recent Labs  Lab 05/17/2020 2111 06/04/20 0632  AST 17 21  ALT 12 16  ALKPHOS 67 49  BILITOT  0.4 0.9  PROT 5.8* 4.9*  ALBUMIN 3.3* 2.9*   No results for input(s): LIPASE, AMYLASE in the last 168 hours. Recent Labs  Lab 06/30/2020 0605 06/03/20 1828  AMMONIA 28 26   Coagulation Profile: No results for input(s): INR, PROTIME in the last 168 hours. Cardiac Enzymes: No results for input(s): CKTOTAL, CKMB, CKMBINDEX, TROPONINI in the last 168 hours. BNP (last 3 results) No  results for input(s): PROBNP in the last 8760 hours. HbA1C: No results for input(s): HGBA1C in the last 72 hours. CBG: Recent Labs  Lab 06/04/20 0038 06/04/20 0535 06/04/20 1155  GLUCAP 102* 104* 99   Lipid Profile: No results for input(s): CHOL, HDL, LDLCALC, TRIG, CHOLHDL, LDLDIRECT in the last 72 hours. Thyroid Function Tests: No results for input(s): TSH, T4TOTAL, FREET4, T3FREE, THYROIDAB in the last 72 hours. Anemia Panel: No results for input(s): VITAMINB12, FOLATE, FERRITIN, TIBC, IRON, RETICCTPCT in the last 72 hours. Sepsis Labs: No results for input(s): PROCALCITON, LATICACIDVEN in the last 168 hours.  Recent Results (from the past 240 hour(s))  Respiratory Panel by RT PCR (Flu A&B, Covid) - Nasopharyngeal Swab     Status: None   Collection Time: 06/19/2020  3:07 AM   Specimen: Nasopharyngeal Swab  Result Value Ref Range Status   SARS Coronavirus 2 by RT PCR NEGATIVE NEGATIVE Final    Comment: (NOTE) SARS-CoV-2 target nucleic acids are NOT DETECTED.  The SARS-CoV-2 RNA is generally detectable in upper respiratoy specimens during the acute phase of infection. The lowest concentration of SARS-CoV-2 viral copies this assay can detect is 131 copies/mL. A negative result does not preclude SARS-Cov-2 infection and should not be used as the sole basis for treatment or other patient management decisions. A negative result may occur with  improper specimen collection/handling, submission of specimen other than nasopharyngeal swab, presence of viral mutation(s) within the areas targeted by this assay, and inadequate number of viral copies (<131 copies/mL). A negative result must be combined with clinical observations, patient history, and epidemiological information. The expected result is Negative.  Fact Sheet for Patients:  PinkCheek.be  Fact Sheet for Healthcare Providers:  GravelBags.it  This test is no t yet  approved or cleared by the [place] FDA and  has been authorized for detection and/or diagnosis of SARS-CoV-2 by FDA under an Emergency Use Authorization (EUA). This EUA will remain  in effect (meaning this test can be used) for the duration of the COVID-19 declaration under Section 564(b)(1) of the Act, 21 U.S.C. section 360bbb-3(b)(1), unless the authorization is terminated or revoked sooner.     Influenza A by PCR NEGATIVE NEGATIVE Final   Influenza B by PCR NEGATIVE NEGATIVE Final    Comment: (NOTE) The Xpert Xpress SARS-CoV-2/FLU/RSV assay is intended as an aid in  the diagnosis of influenza from Nasopharyngeal swab specimens and  should not be used as a sole basis for treatment. Nasal washings and  aspirates are unacceptable for Xpert Xpress SARS-CoV-2/FLU/RSV  testing.  Fact Sheet for Patients: PinkCheek.be  Fact Sheet for Healthcare Providers: GravelBags.it  This test is not yet approved or cleared by the [place] FDA and  has been authorized for detection and/or diagnosis of SARS-CoV-2 by  FDA under an Emergency Use Authorization (EUA). This EUA will remain  in effect (meaning this test can be used) for the duration of the  Covid-19 declaration under Section 564(b)(1) of the Act, 21  U.S.C. section 360bbb-3(b)(1), unless the authorization is  terminated or revoked. Performed at [facility]  [facility], [place], [place] [address]   MRSA PCR Screening     Status: None   Collection Time: 06/02/20 10:25 AM   Specimen: Nasopharyngeal  Result Value Ref Range Status   MRSA by PCR NEGATIVE NEGATIVE Final    Comment:        The GeneXpert MRSA Assay (FDA approved for NASAL specimens only), is one component of a comprehensive MRSA colonization surveillance program. It is not intended to diagnose MRSA infection nor to guide or monitor treatment for MRSA infections. Performed at [facility], [address] [place], [place] [address]       Imaging Studies   DG Chest Cyr 1 View  Result Date: 06/03/2020 CLINICAL DATA:  Shortness of breath, history cirrhosis, hypertension, COPD EXAM: PORTABLE CHEST 1 VIEW COMPARISON:  Portable exam 0806 hours compared to 05/09/2020 FINDINGS: Normal heart size, mediastinal contours, and pulmonary vascularity. Atherosclerotic calcification aorta. Layered pleural effusion throughout RIGHT chest. Atelectasis versus infiltrate LEFT lower lobe. No pneumothorax or acute osseous findings. IMPRESSION: LEFT lower lobe atelectasis versus consolidation. RIGHT pleural effusion. Electronically Signed   By: Lavonia Dana M.D.   On: 06/03/2020 08:50     Medications   Scheduled Meds: . Chlorhexidine Gluconate Cloth  6 each Topical Daily  . fluticasone furoate-vilanterol  1 puff Inhalation Daily  . folic acid  1 mg Oral Daily  . influenza vac split quadrivalent PF  0.5 mL Intramuscular Tomorrow-1000  . mouth rinse  15 mL Mouth Rinse BID  . pantoprazole (PROTONIX) IV  40 mg Intravenous Q12H  . pneumococcal 23 valent vaccine  0.5 mL Intramuscular Tomorrow-1000   Continuous Infusions: . cefTRIAXone (ROCEPHIN)  IV Stopped (06/04/20 0242)  . dexmedetomidine (PRECEDEX) IV infusion 1.2 mcg/kg/hr (06/04/20 1117)  . metronidazole 500 mg (06/04/20 1027)  . octreotide  (SANDOSTATIN)    IV infusion 50 mcg/hr (06/04/20 0800)  . thiamine injection 500 mg (06/04/20 0954)       LOS: 3 days    Time spent: 25 minutes with > 50% spent in coordination of care and direct patient contact.    Ezekiel Slocumb, DO [facility]  06/04/2020, 2:43 PM    If 7PM-7AM, please contact night-coverage. How to contact the [facility] Attending or Consulting provider [place] or covering provider during after hours [place], for this patient?    1. Check the care team in [facility] and look for a) attending/consulting TR[facility] provider listed and b) the [facility] team listed 2. Log  into [email or web address] and use [place] universal password to access. If you do not have the password, please contact the hospital operator. 3. Locate the [facility] provider you are looking for under [facility] and page to a number that you can be directly reached. 4. If you still have difficulty reaching the provider, please page the [facility] (Director on Call) for the Hospitalists listed on amion for assistance.

## 2020-06-05 DIAGNOSIS — Z515 Encounter for palliative care: Secondary | ICD-10-CM

## 2020-06-05 DIAGNOSIS — K766 Portal hypertension: Secondary | ICD-10-CM | POA: Diagnosis not present

## 2020-06-05 DIAGNOSIS — R0902 Hypoxemia: Secondary | ICD-10-CM

## 2020-06-05 DIAGNOSIS — Z7189 Other specified counseling: Secondary | ICD-10-CM

## 2020-06-05 DIAGNOSIS — R410 Disorientation, unspecified: Secondary | ICD-10-CM | POA: Diagnosis not present

## 2020-06-05 DIAGNOSIS — K7030 Alcoholic cirrhosis of liver without ascites: Secondary | ICD-10-CM | POA: Diagnosis not present

## 2020-06-05 LAB — CBC
HCT: 30.7 % — ABNORMAL LOW (ref 39.0–52.0)
Hemoglobin: 8.4 g/dL — ABNORMAL LOW (ref 13.0–17.0)
MCH: 22 pg — ABNORMAL LOW (ref 26.0–34.0)
MCHC: 27.4 g/dL — ABNORMAL LOW (ref 30.0–36.0)
MCV: 80.4 fL (ref 80.0–100.0)
Platelets: 36 10*3/uL — ABNORMAL LOW (ref 150–400)
RBC: 3.82 MIL/uL — ABNORMAL LOW (ref 4.22–5.81)
RDW: 24.4 % — ABNORMAL HIGH (ref 11.5–15.5)
WBC: 5 10*3/uL (ref 4.0–10.5)
nRBC: 0 % (ref 0.0–0.2)

## 2020-06-05 LAB — MAGNESIUM: Magnesium: 2 mg/dL (ref 1.7–2.4)

## 2020-06-05 LAB — PHOSPHORUS: Phosphorus: 2.2 mg/dL — ABNORMAL LOW (ref 2.5–4.6)

## 2020-06-05 LAB — PROTIME-INR
INR: 1.3 — ABNORMAL HIGH (ref 0.8–1.2)
Prothrombin Time: 15.6 seconds — ABNORMAL HIGH (ref 11.4–15.2)

## 2020-06-05 LAB — GLUCOSE, CAPILLARY
Glucose-Capillary: 143 mg/dL — ABNORMAL HIGH (ref 70–99)
Glucose-Capillary: 142 mg/dL — ABNORMAL HIGH (ref 70–99)

## 2020-06-05 MED ORDER — ACETAMINOPHEN 325 MG PO TABS: 650.0000 mg | ORAL_TABLET | Freq: Four times a day (QID) | ORAL | Status: DC | PRN

## 2020-06-05 MED ORDER — BIOTENE DRY MOUTH MT LIQD: 15.0000 mL | OROMUCOSAL | Status: DC | PRN

## 2020-06-05 MED ORDER — MORPHINE 100MG IN NS 100ML (1MG/ML) PREMIX INFUSION
2.0000 mg/h | INTRAVENOUS | Status: DC
  Administered 2020-06-05: 2 mg/h via INTRAVENOUS
  Filled 2020-06-05: qty 100

## 2020-06-05 MED ORDER — RISPERIDONE 1 MG PO TBDP
1.0000 mg | ORAL_TABLET | Freq: Two times a day (BID) | ORAL | Status: DC
  Administered 2020-06-05 (×2): 1 mg via ORAL
  Filled 2020-06-05 (×5): qty 1

## 2020-06-05 MED ORDER — SODIUM CHLORIDE 0.9% FLUSH
3.0000 mL | Freq: Two times a day (BID) | INTRAVENOUS | Status: DC
  Administered 2020-06-05 (×2): 3 mL via INTRAVENOUS

## 2020-06-05 MED ORDER — MORPHINE BOLUS VIA INFUSION
1.0000 mg | INTRAVENOUS | Status: DC | PRN
  Administered 2020-06-05 (×2): 2 mg via INTRAVENOUS
  Administered 2020-06-05: 1 mg via INTRAVENOUS
  Administered 2020-06-05: 2 mg via INTRAVENOUS
  Administered 2020-06-05: 1 mg via INTRAVENOUS
  Administered 2020-06-05 (×3): 2 mg via INTRAVENOUS
  Filled 2020-06-05: qty 2

## 2020-06-05 MED ORDER — ACETAMINOPHEN 650 MG RE SUPP: 650.0000 mg | Freq: Four times a day (QID) | RECTAL | Status: DC | PRN

## 2020-06-05 MED ORDER — PHENOBARBITAL SODIUM 65 MG/ML IJ SOLN
130.0000 mg | Freq: Once | INTRAMUSCULAR | Status: AC
  Administered 2020-06-05: 130 mg via INTRAVENOUS
  Filled 2020-06-05: qty 2

## 2020-06-05 MED ORDER — DEXTROSE 5 % IV SOLN: INTRAVENOUS | Status: DC

## 2020-06-05 MED ORDER — HALOPERIDOL LACTATE 5 MG/ML IJ SOLN
1.0000 mg | INTRAMUSCULAR | Status: DC | PRN
  Administered 2020-06-05: 1 mg via INTRAVENOUS
  Filled 2020-06-05: qty 1

## 2020-06-05 MED ORDER — GLYCOPYRROLATE 1 MG PO TABS
1.0000 mg | ORAL_TABLET | ORAL | Status: DC | PRN
  Filled 2020-06-05: qty 1

## 2020-06-05 MED ORDER — SODIUM CHLORIDE 0.9% FLUSH: 3.0000 mL | INTRAVENOUS | Status: DC | PRN

## 2020-06-05 MED ORDER — MORPHINE SULFATE (PF) 2 MG/ML IV SOLN
2.0000 mg | INTRAVENOUS | Status: DC | PRN
  Administered 2020-06-05: 2 mg via INTRAVENOUS
  Administered 2020-06-05: 4 mg via INTRAVENOUS
  Filled 2020-06-05: qty 2
  Filled 2020-06-05: qty 1

## 2020-06-05 MED ORDER — GLYCOPYRROLATE 0.2 MG/ML IJ SOLN: 0.2000 mg | INTRAMUSCULAR | Status: DC | PRN

## 2020-06-05 MED ORDER — RISPERIDONE 1 MG PO TBDP
0.5000 mg | ORAL_TABLET | Freq: Two times a day (BID) | ORAL | Status: DC
  Filled 2020-06-05 (×2): qty 0.5

## 2020-06-05 MED ORDER — HALOPERIDOL LACTATE 5 MG/ML IJ SOLN: 5.0000 mg | INTRAMUSCULAR | Status: DC | PRN

## 2020-06-05 MED ORDER — POLYVINYL ALCOHOL 1.4 % OP SOLN
1.0000 [drp] | Freq: Four times a day (QID) | OPHTHALMIC | Status: DC | PRN
  Filled 2020-06-05: qty 15

## 2020-06-05 MED ORDER — GLYCOPYRROLATE 0.2 MG/ML IJ SOLN
0.2000 mg | INTRAMUSCULAR | Status: DC | PRN
  Administered 2020-06-06: 0.2 mg via INTRAVENOUS
  Filled 2020-06-05: qty 1

## 2020-06-05 NOTE — Progress Notes (Signed)
Palliative team at bedside to speak with wife and daughter.  Want Precedex turned off and manage patient with PO and IV push medications and to remove restraints as patient is comfort measures.

## 2020-06-05 NOTE — TOC Progression Note (Addendum)
Transition of Care [facility]) - Progression Note    Patient Details  Name: TOBEN ACUNA MRN: [id number] Date of Birth: 04-18-1961  Transition of Care [facility]) CM/SW [place], [place] Phone Number: [phone] 06/05/2020, 11:51 AM  Clinical Narrative:     CSW spoke with patient's [place], Pindell (Spouse) [phone], ans explained role of TOC in patient care.  Ms. Pasquariello stated patient is able to perform all ADLs at home and is able to drive himself.  Ms. Wessell stated patient has a PCP in [place] or [place], but she does not remember the PCP's name.  Ms. Gartin stated the patient will return home with hospice but she could not remember the name of the palliative care RN or hospice RN she spoke with.  CSW stated I would find out, and thanked Ms. Ham for her time.  CSW reached out to Attending and Palliative Care NP.  Crystal NP she confirmed [facility] is following patient.  Expected Discharge Plan: [place] Barriers to Discharge: Continued Medical Work up  Expected Discharge Plan and Services Expected Discharge Plan: [place] In-house Referral: Clinical Social Work     Living arrangements for the past 2 months: Single Family Home                                       Social Determinants of Health (SDOH) Interventions    Readmission Risk Interventions No flowsheet data found.

## 2020-06-05 NOTE — Progress Notes (Signed)
[facility] Liaison note: New referral for family interest in [place] [facility].  Patient discussed with both attendng Dr. Arbutus Ped and Palliative NP Asencion Gowda. Patient continues with extreme agitation and medications are being adjusted. No plans for discharge at this time.  Patient information sent to referral.  Liaison to follow up on Monday. Thank you. Flo Shanks BSN, RN, [place] [phone]

## 2020-06-05 NOTE — Progress Notes (Signed)
[place] [facility]) Hospital Liaison RN note:  Spoke with patient's sister, Jeani Hawking and spouse, Hinton Dyer briefly in the room to provide information related to hospice services and to answer any questions. They acknowledged an understanding but wanted to wait through the weekend to see how his condition evolved. Assured her that we would follow up with her on Monday morning.  Thank you.  Zandra Abts, RN [facility] Liaison [phone]

## 2020-06-05 NOTE — Progress Notes (Signed)
Called and spoke to Palliative regarding patient's agitation.  I have given 2 doses of Morphine (2 mg first time and 4 mg second time), Haldol 1 mg IV, and Risperdal PO with no change in behaviors.

## 2020-06-05 NOTE — Progress Notes (Signed)
Notified Wife and sister at bedside notified  patient is to be transferred to floor. Will notify when we have a room.

## 2020-06-05 NOTE — Progress Notes (Signed)
PROGRESS NOTE    Manuel Jimenez   UXN:[id number]  DOB: 10-25-1960  PCP: [name], DO    DOA: 05/16/2020 LOS: 4   Brief Narrative   Manuel Jimenez is a 59 y.o. male with medical history significant for alcoholic cirrhosis with portal hypertension, history of bleeding esophageal varices as well as history of gastric ulcer who presented to the ED on 05/21/2020 with a several day history of worsening shortness of breath on exertion which he stated similar to prior episodes of acute anemia.  Also reported dark tarry stools, no abdominal pain.  In the ED, BP was 91/58, RR 26, O2 sat 78% on room air needing 2-4 L/min oxygen.  Labs notable for Hbg 6.4 (10.8 a year prior), platelets 102k. Chest xray showed left lower lobe atelectasis vs consolidation and right pleural effusion.    Patient started on IV Protonix and IV octreotide as well as transfusion of pRBC's.  Admitted to hospitalist service with GI consulted.    11/1 - EGD showed grade III esophageal varices, incompletely eradicated, banded.  Portal hypertensive gastropathy, non-bleeding gastric ulcer.  Overnight 11/1-2, patient with significant agitation and combativeness, pulled out IV.  Requiring Precedex drip in ICU.         Assessment & Plan   Principal Problem:   Delirium Active Problems:   Alcoholic cirrhosis of liver without ascites (HCC)   Symptomatic anemia   Portal hypertension (HCC)   Acute blood loss anemia   Melena   History of esophageal varices with bleeding   History of gastric ulcer   Hypoxia   Acute respiratory failure with hypoxia (HCC)   COPD exacerbation ([place])    Comfort care status - as of morning of 11/5 after patient's wife met with palliative care.  Has been transitioned to morphine infusion.  Awaiting hospice bed to become available.  Need to ensure control of agitation with regimen feasible at hospice. --Morphine drip --PRN Haldol, PRN Versed --Notify provider if signs of  pain or discomfort   Acute metabolic encephalopathy /delirium with agitation -onset appears to have occurred after EGD evening of 11/1.  Unclear of exact etiology but could be metabolic with uremia secondary to his GI bleeding versus hepatic encephalopathy.  Patient does have a history of alcohol abuse but reportedly sober for 5 years, seems less likely Wernicke encephalopathy but is on high-dose IV thiamine currently.  Previous ammonia level was normal, but patient not having BMs and unable to take p.o. meds due to sedation.  CT head on 11/2 was negative for acute intracranial findings.   GI suspects hepatic encephalopathy exacerbated by GI bleeding.   Initially treated with Haldol and Geodon, apparently with little effect. Per wife, Ativan exacerbates his agitation. 11/3 - Ammonia level normal. 11/4 - no improvement, pt quickly agitated when sedation lightened per RN and sitter.   Presentation is highly suspicious for alcohol or withdrawal from another substance.  If this is the case, expect to see improvement over next few days. 11/5 - still no improvement, severe agitation requiring constant sedation. --d/cPrecedex drip --PRN Versed, Haldol --started on Risperdal  Alcoholic cirrhosis without ascites Portal hypertension with bleeding esophageal varices - s/p EGD on 11/1 with banding of grade 3 varices Portal hypertensive gastropathy -seen on EGD 11/9 Thrombocytopenia -transfused a unit of platelets on 11/2 --GI consulted --Currently n.p.o. given sedation and encephalopathy --Discontinued IV PPI, octreotide drip, empiric Rocephin --Palliative care consulted for goals of care --Pt made DNR/DNI by PCCM team after discussion  with wife at bedside on 11/3.  Hypernatremia - due to NPO status.  No further lab draws or IV fluids due to comfort care status.   Acute blood loss anemia secondary to esophageal variceal bleeding -patient presented with hemoglobin 6.4, transfused 2 units PRBCs on  admission, third unit transfused on 11/2.    Acute respiratory failure with hypoxia -present on admission with pulse ox 78% on room air and symptomatic with SOB/dyspnea. Possibly due to esophageal bleeding, encephalopathy, possible PNA on xray.  No fevers or leukocytosis to suggest infection however. --Continue supplemental O2 to maintain sats over 90%  COPD with acute exacerbation - present on admission. Off IV steroids.   --Continue nebs and inhalers per orders.  Involuntary committed in ER - psych was consulted, evaluated patient 11/3, signed off.    DVT prophylaxis: scd's   Diet:  Diet Orders (From admission, onward)    Start     Ordered   06/05/20 1306  Diet regular Room service appropriate? Yes; Fluid consistency: Thin  Diet effective now       Comments: As tolerated by patient.  Question Answer Comment  Room service appropriate? Yes   Fluid consistency: Thin      06/05/20 1307            Code Status: DNR    Subjective 06/05/20    Patient sedated on Precedex when seen in ICU.  Wife and another male family member at bedside.  They had just met with palliative and have decided to pursue comfort care for patient.  We discussed changing regimen of medications to those that are feasible at hospice facility.  Wife hopeful patient could come home, but would be unmanageable and unsafe.  She expressed understanding, but understandably upset.  Patient restless but sedated, in no distress, does not respond to me during physical exam.    Disposition Plan & Communication   Status is: Inpatient  Remains inpatient appropriate because:Inpatient level of care appropriate due to severity of illness.  Patient is now comfort care, awaiting hospice bed and adjusting meds as above.   Dispo: The patient is from: Home              Anticipated d/c is to: hospice              Anticipated d/c date is: pending hospice bed              Patient currently is not medically stable to  d/c.     Family Communication: wife at bedside on rounds    Consults, Procedures, Significant Events   Consultants:   PCCM  Gastoenterology  Palliative care  Procedures:   EGD 11/1 - esophageal varices banded  Antimicrobials:  Anti-infectives (From admission, onward)   Start     Dose/Rate Route Frequency Ordered Stop   06/03/20 2200  metroNIDAZOLE (FLAGYL) IVPB 500 mg  Status:  Discontinued        500 mg 100 mL/hr over 60 Minutes Intravenous Every 12 hours 06/03/20 2111 06/04/20 1457   06/02/20 0300  cefTRIAXone (ROCEPHIN) 1 g in sodium chloride 0.9 % 100 mL IVPB  Status:  Discontinued        1 g 200 mL/hr over 30 Minutes Intravenous Every 24 hours 06/09/2020 0302 06/05/20 1011   06/13/2020 0200  cefTRIAXone (ROCEPHIN) 1 g in sodium chloride 0.9 % 100 mL IVPB        1 g 200 mL/hr over 30 Minutes Intravenous  Once 06/27/2020 0157 06/20/2020 0353  Objective   Vitals:   06/05/20 0300 06/05/20 0400 06/05/20 0700 06/05/20 0800  BP: (!) 108/54 (!) 113/54 (!) 148/73 140/82  Pulse: (!) 43 (!) 43 (!) 53 (!) 38  Resp: _0 Temp:    98.3 F (36.8 C)  TempSrc:    Axillary  SpO2: 100% 100% 100% 97%  Weight:      Height:        Intake/Output Summary (Last 24 hours) at 06/05/2020 1615 Last data filed at 06/05/2020 1430 Gross per 24 hour  Intake 1430.98 ml  Output 780 ml  Net 650.98 ml   Filed Weights   05/01/2020 2103 06/05/2020 1024  Weight: 60.8 kg 63.2 kg    Physical Exam:  General exam: sedated, restless, talking in sleep, no acute distress, underweight Respiratory system: CTAB, normal respiratory effort, on 15 L/min HFNC oxygen. Cardiovascular system: normal S1/S2, RRR, no pedal edema.   Gastrointestinal system: soft, NT, ND Central nervous system: unable to evaluate due to patient sedated and unresponsive to verbal or tactile stimulus   Labs   Data Reviewed: I have personally reviewed following labs and imaging studies  CBC: Recent Labs  Lab  05/20/2020 2111 05/21/2020 2111 06/29/2020 0521 06/18/2020 0521 06/02/20 0521 06/02/20 2000 06/03/20 0354 06/03/20 0354 06/03/20 1215 06/03/20 1711 06/03/20 2335 06/04/20 0632 06/05/20 0429  WBC 8.8   < > 8.7  --  5.8  --  4.9  --   --   --   --  5.8 5.0  NEUTROABS 6.5  --   --   --   --   --   --   --   --   --   --   --   --   HGB 6.4*   < > 8.0*   < > 7.6*   < > 7.9*   < > 8.2* 8.2* 8.0* 8.2* 8.4*  HCT 24.4*   < > 28.9*   < > 26.8*   < > 29.5*   < > 29.9* 29.5* 29.4* 30.1* 30.7*  MCV 72.0*   < > 75.9*  --  77.7*  --  80.4  --   --   --   --  80.7 80.4  PLT 102*   < > 68*  --  44*  --  45*  --   --   --   --  32* 36*   < > = values in this interval not displayed.   Basic Metabolic Panel: Recent Labs  Lab 05/10/2020 2111 06/02/20 0521 06/03/20 0354 06/04/20 0632 06/04/20 1514 06/05/20 0429  NA 142 143 147* 151* 153*  --   K 4.8 4.6 4.7 4.2 4.2  --   CL 106 101 104 108 108  --   CO2 25 33* 34* 34* 37*  --   GLUCOSE 132* 147* 115* 103* 111*  --   BUN 19 16 27* 25* 24*  --   CREATININE 0.56* 0.62 0.63 0.51* 0.49*  --   CALCIUM 8.7* 8.1* 8.1* 8.2* 8.4*  --   MG  --   --  2.0 2.0  --  2.0  PHOS  --   --  3.9 3.5  --  2.2*   GFR: Estimated Creatinine Clearance: 87.8 mL/min (A) (by C-G formula based on SCr of 0.49 mg/dL (L)). Liver Function Tests: Recent Labs  Lab 05/17/2020 2111 06/04/20 0632 06/04/20 1514  AST _1 ALT _2 ALKPHOS 67 49 53  BILITOT 0.4 0.9 1.3*  PROT 5.8* 4.9* 4.8*  ALBUMIN 3.3* 2.9* 3.0*   No results for input(s): LIPASE, AMYLASE in the last 168 hours. Recent Labs  Lab 06/12/2020 0605 06/03/20 1828  AMMONIA 28 26   Coagulation Profile: Recent Labs  Lab 06/05/20 0429  INR 1.3*   Cardiac Enzymes: No results for input(s): CKTOTAL, CKMB, CKMBINDEX, TROPONINI in the last 168 hours. BNP (last 3 results) No results for input(s): PROBNP in the last 8760 hours. HbA1C: No results for input(s): HGBA1C in the last 72 hours. CBG: Recent Labs   Lab 06/04/20 1155 06/04/20 1812 06/04/20 2341 06/05/20 0530 06/05/20 1122  GLUCAP 99 108* 139* 143* 142*   Lipid Profile: No results for input(s): CHOL, HDL, LDLCALC, TRIG, CHOLHDL, LDLDIRECT in the last 72 hours. Thyroid Function Tests: No results for input(s): TSH, T4TOTAL, FREET4, T3FREE, THYROIDAB in the last 72 hours. Anemia Panel: No results for input(s): VITAMINB12, FOLATE, FERRITIN, TIBC, IRON, RETICCTPCT in the last 72 hours. Sepsis Labs: No results for input(s): PROCALCITON, LATICACIDVEN in the last 168 hours.  Recent Results (from the past 240 hour(s))  Respiratory Panel by RT PCR (Flu A&B, Covid) - Nasopharyngeal Swab     Status: None   Collection Time: 06/19/2020  3:07 AM   Specimen: Nasopharyngeal Swab  Result Value Ref Range Status   SARS Coronavirus 2 by RT PCR NEGATIVE NEGATIVE Final    Comment: (NOTE) SARS-CoV-2 target nucleic acids are NOT DETECTED.  The SARS-CoV-2 RNA is generally detectable in upper respiratoy specimens during the acute phase of infection. The lowest concentration of SARS-CoV-2 viral copies this assay can detect is 131 copies/mL. A negative result does not preclude SARS-Cov-2 infection and should not be used as the sole basis for treatment or other patient management decisions. A negative result may occur with  improper specimen collection/handling, submission of specimen other than nasopharyngeal swab, presence of viral mutation(s) within the areas targeted by this assay, and inadequate number of viral copies (<131 copies/mL). A negative result must be combined with clinical observations, patient history, and epidemiological information. The expected result is Negative.  Fact Sheet for Patients:  PinkCheek.be  Fact Sheet for Healthcare Providers:  GravelBags.it  This test is no t yet approved or cleared by the [place] FDA and  has been authorized for detection and/or  diagnosis of SARS-CoV-2 by FDA under an Emergency Use Authorization (EUA). This EUA will remain  in effect (meaning this test can be used) for the duration of the COVID-19 declaration under Section 564(b)(1) of the Act, 21 U.S.C. section 360bbb-3(b)(1), unless the authorization is terminated or revoked sooner.     Influenza A by PCR NEGATIVE NEGATIVE Final   Influenza B by PCR NEGATIVE NEGATIVE Final    Comment: (NOTE) The Xpert Xpress SARS-CoV-2/FLU/RSV assay is intended as an aid in  the diagnosis of influenza from Nasopharyngeal swab specimens and  should not be used as a sole basis for treatment. Nasal washings and  aspirates are unacceptable for Xpert Xpress SARS-CoV-2/FLU/RSV  testing.  Fact Sheet for Patients: PinkCheek.be  Fact Sheet for Healthcare Providers: GravelBags.it  This test is not yet approved or cleared by the [place] FDA and  has been authorized for detection and/or diagnosis of SARS-CoV-2 by  FDA under an Emergency Use Authorization (EUA). This EUA will remain  in effect (meaning this test can be used) for the duration of the  Covid-19 declaration under Section 564(b)(1) of the Act, 21  U.S.C. section 360bbb-3(b)(1), unless the  authorization is  terminated or revoked. Performed at [facility], [address] [place], [place] [address]   MRSA PCR Screening     Status: None   Collection Time: 06/02/20 10:25 AM   Specimen: Nasopharyngeal  Result Value Ref Range Status   MRSA by PCR NEGATIVE NEGATIVE Final    Comment:        The GeneXpert MRSA Assay (FDA approved for NASAL specimens only), is one component of a comprehensive MRSA colonization surveillance program. It is not intended to diagnose MRSA infection nor to guide or monitor treatment for MRSA infections. Performed at [facility], [address], [place], [place] [address]       Imaging Studies   No  results found.   Medications   Scheduled Meds: . Chlorhexidine Gluconate Cloth  6 each Topical Daily  . mouth rinse  15 mL Mouth Rinse BID  . pantoprazole (PROTONIX) IV  40 mg Intravenous Q12H  . risperiDONE  1 mg Oral BID  . sodium chloride flush  3 mL Intravenous Q12H   Continuous Infusions: . morphine 2 mg/hr (06/05/20 1429)       LOS: 4 days    Time spent: 25 minutes with > 50% spent in coordination of care and direct patient contact.    Ezekiel Slocumb, DO [facility]  06/05/2020, 4:15 PM    If 7PM-7AM, please contact night-coverage. How to contact the [facility] Attending or Consulting provider [place] or covering provider during after hours [place], for this patient?    1. Check the care team in [facility] and look for a) attending/consulting [facility] provider listed and b) the [facility] team listed 2. Log into [email or web address] and use [place]'s universal password to access. If you do not have the password, please contact the hospital operator. 3. Locate the [facility] provider you are looking for under [facility] and page to a number that you can be directly reached. 4. If you still have difficulty reaching the provider, please page the [facility] (Director on Call) for the Hospitalists listed on amion for assistance.

## 2020-06-06 MED ORDER — PHENOBARBITAL SODIUM 65 MG/ML IJ SOLN: 130.0000 mg | Freq: Three times a day (TID) | INTRAMUSCULAR | Status: DC | PRN

## 2020-06-06 MED ORDER — PHENOBARBITAL SODIUM 65 MG/ML IJ SOLN: 130.0000 mg | Freq: Four times a day (QID) | INTRAMUSCULAR | Status: DC | PRN

## 2020-07-01 NOTE — Progress Notes (Signed)
Cross Cover Patient under comfort care passed away peacefully with hims wife by his side. Time of death 11 per 2 RN pronouncement

## 2020-07-01 NOTE — Accreditation Note (Signed)
Restraints reported to CMS  Pursuant to regulation 482.13 (G) (3) use of restraints was logged and CMS was notified via CMS electronic portal on 06/16/2020 at 1633 by Eddie Dibbles RN Accreditation Coordinator.

## 2020-07-01 NOTE — Progress Notes (Signed)
Pt has been resting comfortably throughout the shift. Sitter was discontinued and soft restraints were discontinued. Will continue to monitor

## 2020-07-01 NOTE — Progress Notes (Signed)
Per [facility] pt is a possible eye and tissue donor. Eye prep complete

## 2020-07-01 NOTE — Death Summary Note (Signed)
Death Summary  Manuel Jimenez HYW:[id number] DOB: 03/10/1961 DOA: 28-Jun-2020  PCP: [name], DO PCP/Office notified: no  Admit date: 28-Jun-2020 Date of Death: 2020/07/04  Final Diagnoses:  Principal Problem:   Delirium Active Problems:   Alcoholic cirrhosis of liver without ascites (HCC)   Symptomatic anemia   Portal hypertension (HCC)   Acute blood loss anemia   Melena   History of esophageal varices with bleeding   History of gastric ulcer   Hypoxia   Acute respiratory failure with hypoxia (HCC)   COPD exacerbation ([place])    1. Acute respiratory failure with hypoxia due to  2. Acute exacerbation of COPD  3. Severe persistent acute metabolic encephalopathy with agitation 4. Advanced liver disease with portal hypertension and resulting esophageal variceal hemorrhage 5. Acute blood loss anemia requiring transfusion  History of present illness and Hospital Course:   Manuel Jimenez a 59 y.o.malewith medical history significant foralcoholic cirrhosis with portal hypertension, history of bleeding esophageal varices as well as history of gastric ulcer who presented to the ED on 2020-06-28 with a several day history of worsening shortness of breath on exertion which he stated similar to prior episodes of acute anemia.  Also reported dark tarry stools, no abdominal pain.  In the ED, BP was 91/58, RR 26, O2 sat 78% on room air needing 2-4 L/min oxygen.  Labs notable for Hbg 6.4 (10.8 a year prior), platelets 102k. Chest xray showed left lower lobe atelectasis vs consolidation and right pleural effusion.    Patient started on IV Protonix and IV octreotide as well as transfusion of pRBC's.  Admitted to hospitalist service with GI consulted.    11/1 - EGD showed grade III esophageal varices, incompletely eradicated, banded.  Portal hypertensive gastropathy, non-bleeding gastric ulcer.  Overnight 11/1-2, patient developed significant agitation and combativeness,  pulled out IV, swinging and kicking staff, causing injury in form of bruised extremities to himself.  Transferred to ICU for Precedex drip.  CT head was negative.   Over the course of the past several days, patient requiring 15 l/min HFNC oxygen to maintain oxygen saturations.  His respiratory rate was highly variable from 6-8 breaths per minute up to 20's-30.  BP was labile but overall stable.    He remained on Precedex drip with PRN Haldol, PRN Versed and other medications tried for agitation without significant benefit. Etiology of patient's encephalopathy / delirium not entirely clear.  He was started on high dose IV thiamine in case of Wernicke's but no improvement seen.  Uremia due to upper GI bleeding could contribute, but would expect to have improved and BUN only mildly elevated.  Concern for hepatic encephalopathy, but ammonia levels were normal.  Given hx of alcoholism, concern for withdrawal and DT's, but patient's wife was repeatedly adamant that patient has been sober for 5 years, also denied patient using any other substances.     Patient's wife met with ICU staff on evening of 11/3 and decided to make patient DNR/DNI code status.  She stated to them she just wanted to take him home with hospice to be comfortable and die in peace.    Palliative care was consulted, given overall poor prognosis given severity of patient's liver disease and its complications, and now persistent encephalopathy and overall declining health and poor quality of life, patient clearly suffering.   MRI and EEG were ordered but wife declined after meeting with palliative and deciding to pursue comfort care measures.  These studies likely would have  been limited by patient's agitation, quite restless and constantly moving around.  We discussed potential findings that MRI or EEG could reveal, including stroke, seizure, mass etc, and that management of any of those would be limited by patient's current condition and  comorbidities.  Wife stated her utmost concern was that the patient not suffer and be kept comfortable.  Her preference was to return home with patient on hospice, but not feasible due to frequent IV medications needed to keep patient calm and comfortable.    Patient was transitioned to morphine infusion.  Versed was d/c'd as unable to be used outside ICU.  Phenobarbital was tried with good results, and patient was reportedly calm over shift last night.  Early hours of this morning, patient's wife reported to nurse that he appeared not breathing.  Nursing staff evaluated, and patient's death was pronounced at 3:40 AM.    Time: 8902  Signed:  Ezekiel Slocumb, DO  [facility] 06-09-2020, 7:16 AM

## 2020-07-01 NOTE — Progress Notes (Signed)
This RN called into pt's room by wife. Wife stated pt was not breathing. This RN and Orvil Feil, RN confirmed pt with no respirations or heartbeat at 0340. Pt's wife was at bedside. Condolences offered. Wife declined chaplin services. Sharion Settler, NP and Rojelio Brenner, [facility] were notified or pt expiring. [facility] was notified. Awaiting call back from [facility] to determine if pt is a possible donor

## 2020-07-01 DEATH — deceased
